# Patient Record
Sex: Female | Born: 1960 | Race: White | Hispanic: No | Smoking: Current every day smoker
Health system: Southern US, Community
[De-identification: ages and names within clinical notes are randomized; demographics above are authoritative.]

## PROBLEM LIST (undated history)

## (undated) DIAGNOSIS — F419 Anxiety disorder, unspecified: Secondary | ICD-10-CM

## (undated) DIAGNOSIS — F319 Bipolar disorder, unspecified: Secondary | ICD-10-CM

## (undated) DIAGNOSIS — M1990 Unspecified osteoarthritis, unspecified site: Secondary | ICD-10-CM

## (undated) DIAGNOSIS — R519 Headache, unspecified: Secondary | ICD-10-CM

## (undated) DIAGNOSIS — D649 Anemia, unspecified: Secondary | ICD-10-CM

## (undated) DIAGNOSIS — R51 Headache: Secondary | ICD-10-CM

## (undated) HISTORY — PX: KNEE SURGERY: SHX244

---

## 2000-11-25 ENCOUNTER — Emergency Department (HOSPITAL_COMMUNITY): Admission: EM | Admit: 2000-11-25 | Discharge: 2000-11-26 | Payer: Self-pay | Admitting: Emergency Medicine

## 2014-06-21 ENCOUNTER — Emergency Department
Admission: EM | Admit: 2014-06-21 | Discharge: 2014-06-21 | Disposition: A | Discharge status: Home / Self Care | Payer: Self-pay | Attending: Emergency Medicine | Admitting: Emergency Medicine

## 2014-06-21 ENCOUNTER — Other Ambulatory Visit: Payer: Self-pay

## 2014-06-21 ENCOUNTER — Encounter: Payer: Self-pay | Admitting: Emergency Medicine

## 2014-06-21 ENCOUNTER — Emergency Department: Payer: Self-pay

## 2014-06-21 DIAGNOSIS — E876 Hypokalemia: Secondary | ICD-10-CM | POA: Insufficient documentation

## 2014-06-21 DIAGNOSIS — R1010 Upper abdominal pain, unspecified: Secondary | ICD-10-CM | POA: Insufficient documentation

## 2014-06-21 DIAGNOSIS — Z79899 Other long term (current) drug therapy: Secondary | ICD-10-CM | POA: Insufficient documentation

## 2014-06-21 DIAGNOSIS — Z880 Allergy status to penicillin: Secondary | ICD-10-CM | POA: Insufficient documentation

## 2014-06-21 DIAGNOSIS — R091 Pleurisy: Secondary | ICD-10-CM | POA: Insufficient documentation

## 2014-06-21 DIAGNOSIS — Z720 Tobacco use: Secondary | ICD-10-CM | POA: Insufficient documentation

## 2014-06-21 DIAGNOSIS — R112 Nausea with vomiting, unspecified: Secondary | ICD-10-CM | POA: Insufficient documentation

## 2014-06-21 DIAGNOSIS — R197 Diarrhea, unspecified: Secondary | ICD-10-CM | POA: Insufficient documentation

## 2014-06-21 LAB — BASIC METABOLIC PANEL
ANION GAP: 17 — AB (ref 5–15)
BUN: 14 mg/dL (ref 6–20)
CO2: 23 mmol/L (ref 22–32)
Calcium: 10 mg/dL (ref 8.9–10.3)
Chloride: 104 mmol/L (ref 101–111)
Creatinine, Ser: 0.84 mg/dL (ref 0.44–1.00)
GFR calc non Af Amer: >60 mL/min (ref >60)
Glucose, Bld: 173 mg/dL — ABNORMAL HIGH (ref 65–99)
Potassium: 2.8 mmol/L — CL (ref 3.5–5.1)
SODIUM: 144 mmol/L (ref 135–145)

## 2014-06-21 LAB — CBC WITH DIFFERENTIAL/PLATELET
Basophils Absolute: 0.1 10*3/uL (ref 0–0.1)
Basophils Relative: 2 %
EOS ABS: 0 10*3/uL (ref 0–0.7)
EOS PCT: 0 %
HCT: 52.1 % — ABNORMAL HIGH (ref 35.0–47.0)
Hemoglobin: 17.4 g/dL — ABNORMAL HIGH (ref 12.0–16.0)
LYMPHS ABS: 0.3 10*3/uL — AB (ref 1.0–3.6)
LYMPHS PCT: 4 %
MCH: 34.8 pg — ABNORMAL HIGH (ref 26.0–34.0)
MCHC: 33.5 g/dL (ref 32.0–36.0)
MCV: 103.9 fL — ABNORMAL HIGH (ref 80.0–100.0)
MONOS PCT: 6 %
Monocytes Absolute: 0.5 10*3/uL (ref 0.2–0.9)
NEUTROS ABS: 8.4 10*3/uL — AB (ref 1.4–6.5)
NEUTROS PCT: 88 %
Platelets: 231 10*3/uL (ref 150–440)
RBC: 5.01 MIL/uL (ref 3.80–5.20)
RDW: 14.5 % (ref 11.5–14.5)
WBC: 9.4 10*3/uL (ref 3.6–11.0)

## 2014-06-21 LAB — LIPASE, BLOOD: Lipase: 53 U/L — ABNORMAL HIGH (ref 22–51)

## 2014-06-21 LAB — TROPONIN I

## 2014-06-21 MED ORDER — IOHEXOL 300 MG/ML  SOLN
100.0000 mL | Freq: Once | INTRAMUSCULAR | Status: AC | PRN
  Administered 2014-06-21: 100 mL via INTRAVENOUS

## 2014-06-21 MED ORDER — HYDROMORPHONE HCL 1 MG/ML IJ SOLN
INTRAMUSCULAR | Status: AC
  Administered 2014-06-21: 0.5 mg via INTRAVENOUS
  Filled 2014-06-21: qty 1

## 2014-06-21 MED ORDER — IOHEXOL 240 MG/ML SOLN
25.0000 mL | Freq: Once | INTRAMUSCULAR | Status: AC | PRN
  Administered 2014-06-21: 25 mL via ORAL

## 2014-06-21 MED ORDER — FENTANYL CITRATE (PF) 100 MCG/2ML IJ SOLN
25.0000 ug | Freq: Once | INTRAMUSCULAR | Status: AC
  Administered 2014-06-21: 25 ug via INTRAVENOUS

## 2014-06-21 MED ORDER — POTASSIUM CHLORIDE CRYS ER 20 MEQ PO TBCR
EXTENDED_RELEASE_TABLET | ORAL | Status: AC
  Administered 2014-06-21: 40 meq via ORAL
  Filled 2014-06-21: qty 2

## 2014-06-21 MED ORDER — PROMETHAZINE HCL 25 MG/ML IJ SOLN
12.5000 mg | Freq: Once | INTRAMUSCULAR | Status: AC
  Administered 2014-06-21: 12.5 mg via INTRAVENOUS

## 2014-06-21 MED ORDER — POTASSIUM CHLORIDE 10 MEQ/100ML IV SOLN
10.0000 meq | INTRAVENOUS | Status: AC
  Filled 2014-06-21 (×3): qty 100

## 2014-06-21 MED ORDER — HYDROMORPHONE HCL 1 MG/ML IJ SOLN
1.0000 mg | Freq: Once | INTRAMUSCULAR | Status: AC
  Administered 2014-06-21: 1 mg via INTRAVENOUS

## 2014-06-21 MED ORDER — POTASSIUM CHLORIDE 10 MEQ/100ML IV SOLN: 10.0000 meq | Freq: Once | INTRAVENOUS | Status: DC

## 2014-06-21 MED ORDER — ONDANSETRON HCL 4 MG/2ML IJ SOLN
4.0000 mg | Freq: Once | INTRAMUSCULAR | Status: AC
  Administered 2014-06-21: 4 mg via INTRAVENOUS

## 2014-06-21 MED ORDER — ONDANSETRON HCL 4 MG/2ML IJ SOLN
INTRAMUSCULAR | Status: AC
  Administered 2014-06-21: 4 mg via INTRAVENOUS
  Filled 2014-06-21: qty 2

## 2014-06-21 MED ORDER — DIPHENHYDRAMINE HCL 50 MG/ML IJ SOLN
25.0000 mg | Freq: Once | INTRAMUSCULAR | Status: AC
  Administered 2014-06-21: 25 mg via INTRAVENOUS

## 2014-06-21 MED ORDER — HYDROMORPHONE HCL 1 MG/ML IJ SOLN
INTRAMUSCULAR | Status: AC
  Administered 2014-06-21: 1 mg via INTRAVENOUS
  Filled 2014-06-21: qty 1

## 2014-06-21 MED ORDER — DICYCLOMINE HCL 10 MG PO CAPS: 10.0000 mg | ORAL_CAPSULE | Freq: Four times a day (QID) | ORAL | Status: DC

## 2014-06-21 MED ORDER — PROMETHAZINE HCL 25 MG/ML IJ SOLN
INTRAMUSCULAR | Status: AC
  Administered 2014-06-21: 12.5 mg via INTRAVENOUS
  Filled 2014-06-21: qty 1

## 2014-06-21 MED ORDER — ONDANSETRON HCL 4 MG PO TABS: 4.0000 mg | ORAL_TABLET | Freq: Every day | ORAL | Status: AC | PRN

## 2014-06-21 MED ORDER — SODIUM CHLORIDE 0.9 % IV BOLUS (SEPSIS)
1000.0000 mL | Freq: Once | INTRAVENOUS | Status: AC
Start: 2014-06-21 — End: 2014-06-21
  Administered 2014-06-21: 1000 mL via INTRAVENOUS

## 2014-06-21 MED ORDER — FENTANYL CITRATE (PF) 100 MCG/2ML IJ SOLN
INTRAMUSCULAR | Status: AC
  Administered 2014-06-21: 25 ug via INTRAVENOUS
  Filled 2014-06-21: qty 2

## 2014-06-21 MED ORDER — ONDANSETRON HCL 4 MG/2ML IJ SOLN
INTRAMUSCULAR | Status: AC
  Administered 2014-06-21: 19:00:00
  Filled 2014-06-21: qty 2

## 2014-06-21 MED ORDER — DIPHENHYDRAMINE HCL 50 MG/ML IJ SOLN
INTRAMUSCULAR | Status: AC
  Filled 2014-06-21: qty 1

## 2014-06-21 MED ORDER — HYDROMORPHONE HCL 1 MG/ML IJ SOLN
0.5000 mg | Freq: Once | INTRAMUSCULAR | Status: AC
Start: 2014-06-21 — End: 2014-06-21
  Administered 2014-06-21: 0.5 mg via INTRAVENOUS

## 2014-06-21 NOTE — ED Notes (Signed)
Pt c/o cough with congestion, chest discomfort since early this morning.Marland Kitchen

## 2014-06-21 NOTE — ED Provider Notes (Signed)
[facility] Emergency Department Provider Note   ____________________________________________  Time seen:   I have reviewed the triage vital signs and the nursing notes.   HISTORY  Chief Complaint Influenza and Pleurisy    HPI Claire Brown is a 54 y.o. female who is complaining of multiple episodes of vomiting and nausea and cough. Moderate abdominal pain. Located mostly upper. Moderate cough. No fever. 2 episodes of diarrhea. No bloody stools. Respirations symptoms started several days ago and the nausea started yesterday.      History reviewed. No pertinent past medical history.  There are no active problems to display for this patient.   Past Surgical History  Procedure Laterality Date  . Cesarean section      Current Outpatient Rx  Name  Route  Sig  Dispense  Refill  . ondansetron (ZOFRAN) 4 MG/5ML solution   Oral   Take by mouth once.           Allergies Augmentin  History reviewed. No pertinent family history.  Social History History  Substance Use Topics  . Smoking status: Smoker, Current Status Unknown  . Smokeless tobacco: Not on file  . Alcohol Use: Yes     Comment: OCC    Review of Systems  Constitutional: Negative for fever. Eyes: Negative for visual changes. ENT: Negative for sore throat. Cardiovascular: Negative for chest pain. Respiratory: Positive cough Gastrointestinal: Positive per history of present illness Genitourinary: Negative for dysuria. Musculoskeletal: Negative for back pain. Skin: Negative for rash. Neurological: Negative for headaches, focal weakness or numbness.   10-point ROS otherwise negative.  ____________________________________________   PHYSICAL EXAM:  VITAL SIGNS: ED Triage Vitals  Enc Vitals Group     BP 06/21/14 0848 136/75 mmHg     Pulse Rate 06/21/14 0848 78     Resp 06/21/14 0848 24     Temp 06/21/14 0848 98.4 F (36.9 C)     Temp Source 06/21/14 0848 Oral   SpO2 06/21/14 0848 100 %     Weight 06/21/14 0848 135 lb (61.236 kg)     Height 06/21/14 0848 5\' 1"  (1.549 m)     Head Cir --      Peak Flow --      Pain Score 06/21/14 0850 6     Pain Loc --      Pain Edu? --      Excl. in [place]? --      Constitutional: Alert and oriented. Patient is unkept. Eyes: Conjunctivae are normal. PERRL. Normal extraocular movements. ENT   Head: Normocephalic and atraumatic.   Nose: No congestion/rhinnorhea.   Mouth/Throat: Mucous membranes are mildly dry.   Neck: No stridor. Cardiovascular: Normal rate, regular rhythm.  No murmurs, rubs, or gallops. Respiratory: Normal respiratory effort without tachypnea nor retractions. Breath sounds are clear and equal bilaterally. No wheezes/rales/rhonchi. Gastrointestinal: Soft and mildly tender in the upper abdomen. No distention. No guarding no rebound. Genitourinary: Deferred Musculoskeletal: Nontender with normal range of motion in all extremities. No joint effusions.  No lower extremity tenderness nor edema. Neurologic:  Normal speech and language. No gross focal neurologic deficits are appreciated. Speech is normal.. Skin:  Skin is warm, dry and intact. No rash noted. Psychiatric: Mood and affect are normal. Speech and behavior are normal. Patient exhibits appropriate insight and judgment.  ____________________________________________    LABS (pertinent positives/negatives)  Normal white blood cell count Potassium 2.8 Troponin negative  CO2 23 Glucose 173 next anion gap 17 ____________________________________________   EKG  78 bpm normal sinus rhythm narrow QRS normal axis. Nonspecific ST and T-wave  ____________________________________________    RADIOLOGY  Reviewed one view portable chest negative  ____________________________________________   PROCEDURES  Procedure(s) performed: None  Critical Care performed: No  ____________________________________________   INITIAL  IMPRESSION / ASSESSMENT AND PLAN / ED COURSE  Pertinent labs & imaging results that were availablec onsidered in my medical decision making (see chart for details).  Patient has been having continued abdominal pain vomiting and diarrhea since she's been in the emergency department and after her first dose of IV symptomatically medications and normal saline. I will repeat the symptomatically doses. I am going to check a CT of her abdomen due to persistent and significant abdominal pain. Patient care transferred to Dr. Dineen Kid.  ____________________________________________   FINAL CLINICAL IMPRESSION(S) / ED DIAGNOSES  Vomiting, nonspecific acute  Hypokalemia  Abdominal, nonspecific acute    Lisa Roca, MD 06/21/14 [phone]

## 2014-06-21 NOTE — ED Notes (Signed)
Vital signs stable.Pt reports a decrease in her nausea

## 2014-06-21 NOTE — ED Provider Notes (Signed)
  Physical Exam  BP 139/84 mmHg  Pulse 82  Temp(Src) 98.4 F (36.9 C) (Oral)  Resp 24  Ht 5\' 1"  (1.549 m)  Wt 135 lb (61.236 kg)  BMI 25.52 kg/m2  SpO2 98% Patient presents with multiple complaints. Per Dr. Reita Cliche, the general impression is a viral type syndrome. However, the patient will be receiving a CAT scan of the abdomen and pelvis to rule out any intrapelvic pathology. Physical Exam ----------------------------------------- 8:40 PM on 06/21/2014 -----------------------------------------  Patient resting comfortably. Had vomited once previously during this visit but was given anti-medics and was tolerating by mouth contrast. Has not vomited since.  ED Course  Procedures Patient has a reassuring abdominal CT as well as labs. We'll discharge with Bentyl and Zofran for symptomatic treatment. Suspect likely viral illness. Possible flu, but is out of the range for Tamiflu. Says the symptoms started this past Tuesday which was 4 days ago.       Doran Stabler, MD 06/21/14 [phone]

## 2014-06-21 NOTE — ED Notes (Signed)
Pt states sx where sudden onset with SOB, cough, bodyaches, nausea.Marland Kitchen

## 2014-06-21 NOTE — ED Notes (Signed)
Pt reports that she started vomiting on Tuesday got worse last night. CP and Abd pain started at 0200. Denies diarrhea.

## 2015-06-05 ENCOUNTER — Emergency Department: Payer: Self-pay

## 2015-06-05 ENCOUNTER — Emergency Department
Admission: EM | Admit: 2015-06-05 | Discharge: 2015-06-05 | Disposition: A | Discharge status: Home / Self Care | Payer: Self-pay | Attending: Emergency Medicine | Admitting: Emergency Medicine

## 2015-06-05 DIAGNOSIS — Y929 Unspecified place or not applicable: Secondary | ICD-10-CM | POA: Insufficient documentation

## 2015-06-05 DIAGNOSIS — Y939 Activity, unspecified: Secondary | ICD-10-CM | POA: Insufficient documentation

## 2015-06-05 DIAGNOSIS — S72412A Displaced unspecified condyle fracture of lower end of left femur, initial encounter for closed fracture: Secondary | ICD-10-CM | POA: Insufficient documentation

## 2015-06-05 DIAGNOSIS — IMO0002 Reserved for concepts with insufficient information to code with codable children: Secondary | ICD-10-CM

## 2015-06-05 DIAGNOSIS — W1839XA Other fall on same level, initial encounter: Secondary | ICD-10-CM | POA: Insufficient documentation

## 2015-06-05 DIAGNOSIS — Y999 Unspecified external cause status: Secondary | ICD-10-CM | POA: Insufficient documentation

## 2015-06-05 MED ORDER — KETOROLAC TROMETHAMINE 60 MG/2ML IM SOLN
60.0000 mg | Freq: Once | INTRAMUSCULAR | Status: AC
  Administered 2015-06-05: 60 mg via INTRAMUSCULAR
  Filled 2015-06-05: qty 2

## 2015-06-05 MED ORDER — OXYCODONE-ACETAMINOPHEN 5-325 MG PO TABS: 1.0000 | ORAL_TABLET | ORAL | Status: DC | PRN

## 2015-06-05 MED ORDER — HYDROCODONE-ACETAMINOPHEN 5-325 MG PO TABS
2.0000 | ORAL_TABLET | Freq: Once | ORAL | Status: AC
  Administered 2015-06-05: 2 via ORAL
  Filled 2015-06-05: qty 2

## 2015-06-05 NOTE — ED Notes (Signed)
One of her dogs ran into her and made her fall 2 days ago now has left knee pain and swelling at this time.

## 2015-06-05 NOTE — ED Provider Notes (Signed)
[facility] Emergency Department Provider Note  ____________________________________________  Time seen: Approximately 8:07 PM  I have reviewed the triage vital signs and the nursing notes.   HISTORY  Chief Complaint Knee Pain    HPI Claire Brown is a 55 y.o. female presents for evaluation of left knee pain and swelling. Patient states that one of her dogs ran into a major fall 2 days ago. Describes her pain as an 8/10 nonradiating. Increased pain with weightbearing and ambulation.   No past medical history on file.  There are no active problems to display for this patient.   Past Surgical History  Procedure Laterality Date  . Cesarean section      Current Outpatient Rx  Name  Route  Sig  Dispense  Refill  . EXPIRED: dicyclomine (BENTYL) 10 MG capsule   Oral   Take 1 capsule (10 mg total) by mouth 4 (four) times daily.   20 capsule   0   . diphenhydrAMINE (BENADRYL) 25 MG tablet   Oral   Take 25 mg by mouth every 6 (six) hours as needed for allergies.         Marland Kitchen meclizine (ANTIVERT) 25 MG tablet   Oral   Take 25 mg by mouth 3 (three) times daily as needed for dizziness or nausea.         . ondansetron (ZOFRAN) 4 MG tablet   Oral   Take 1 tablet (4 mg total) by mouth daily as needed for nausea or vomiting.   10 tablet   1   . oxyCODONE-acetaminophen (ROXICET) 5-325 MG tablet   Oral   Take 1-2 tablets by mouth every 4 (four) hours as needed for severe pain.   30 tablet   0   . triamcinolone (NASACORT) 55 MCG/ACT AERO nasal inhaler   Nasal   Place 2 sprays into the nose daily.           Allergies Augmentin and Valium  No family history on file.  Social History Social History  Substance Use Topics  . Smoking status: Smoker, Current Status Unknown  . Smokeless tobacco: Not on file  . Alcohol Use: Yes     Comment: OCC    Review of Systems Constitutional: No fever/chills Cardiovascular: Denies chest  pain. Respiratory: Denies shortness of breath. Musculoskeletal: Positive for left knee pain and swelling. Skin: Negative for rash. Neurological: Negative for headaches, focal weakness or numbness.  10-point ROS otherwise negative.  ____________________________________________   PHYSICAL EXAM:  VITAL SIGNS: ED Triage Vitals  Enc Vitals Group     BP 06/05/15 1952 103/74 mmHg     Pulse Rate 06/05/15 1952 97     Resp 06/05/15 1952 18     Temp 06/05/15 1952 98.2 F (36.8 C)     Temp Source 06/05/15 1952 Oral     SpO2 06/05/15 1952 96 %     Weight 06/05/15 1952 130 lb (58.968 kg)     Height 06/05/15 1952 5' (1.524 m)     Head Cir --      Peak Flow --      Pain Score 06/05/15 1959 8     Pain Loc --      Pain Edu? --      Excl. in [place]? --     Constitutional: Alert and oriented. Well appearing and in no acute distress. Cardiovascular: Normal rate, regular rhythm. Grossly normal heart sounds.  Good peripheral circulation. Respiratory: Normal respiratory effort.  No retractions. Lungs  CTAB. Musculoskeletal: Left knee with obvious edema with tenderness noted. Limited range of motion. Increased pain with flexion. Distally neurovascularly intact. Positive ecchymosis and bruising noted. Mainly on the medial aspect. Neurologic:  Normal speech and language. No gross focal neurologic deficits are appreciated. No gait instability. Skin:  Skin is warm, dry and intact. No rash noted. Psychiatric: Mood and affect are normal. Speech and behavior are normal.  ____________________________________________   LABS (all labs ordered are listed, but only abnormal results are displayed)  Labs Reviewed - No data to display ____________________________________________  RADIOLOGY  FINDINGS: Probable impaction fracture to the femoral condyle with small osseous fragment in Hoffa's fat pad. Small osseous density adjacent to the lateral joint line. There is a moderate joint effusion. Tibial femoral  alignment is maintained. Patella appears normally located. Anterior soft tissue edema.  IMPRESSION: Probable impaction fracture of the femoral condyle with small fracture fragments in Hoffa's fat pad and joint effusion. Findings can be seen with patellar dislocation relocation injury. Patella appears normally located at this time. ____________________________________________   PROCEDURES  Procedure(s) performed: None  Critical Care performed: No  ____________________________________________   INITIAL IMPRESSION / ASSESSMENT AND PLAN / ED COURSE  Pertinent labs & imaging results that were available during my care of the patient were reviewed by me and considered in my medical decision making (see chart for details). Discussed all clinical findings with Dr. Mack Guise. Patient to have a CT of her knee prior to discharge and to follow-up in the office tomorrow. Patient discharged home with a prescription for Percocet 5/325 #30 no refills ____________________________________________   FINAL CLINICAL IMPRESSION(S) / ED DIAGNOSES  Final diagnoses:  Femoral condyle fracture, left, closed, initial encounter     This chart was dictated using voice recognition software/Dragon. Despite best efforts to proofread, errors can occur which can change the meaning. Any change was purely unintentional.   Arlyss Repress, PA-C 06/05/15 2052  Wandra Arthurs, MD 06/05/15 [phone]

## 2015-06-05 NOTE — ED Notes (Signed)
Pt reports to ED w/ complaints of left knee pain; pt reports that "one of our huskie's clipped me" on the left side 2 days ago. Pt left knee bruised, swollen, pt reports difficulty walking due to pain.  Pt with full range of motion to left knee.

## 2015-06-05 NOTE — Discharge Instructions (Signed)
Knee Fracture, Adult °A knee fracture is a break in a bone of the knee. The break may be in the kneecap (patella), the lower part of the thigh bone (femur), or the upper part of the shin bone (tibia). °There are several types of fractures. They include: °· Stable. In this type of fracture, the bones of the knee remain in place after the break. °· Displaced. In this type of fracture, the bones no longer line up after the break. °· Comminuted. In this type of fracture, the bone breaks into several pieces. °· Open. In this type of fracture, the broken bone comes through the skin. °CAUSES °This injury is usually caused by a fall. This injury can happen because of the impact of the fall or from a violent contraction of the leg muscles before you hit the ground. It can also result from a car accident or a collision with a hard surface. °RISK FACTORS °This injury is more likely to develop in people who: °· Are female. °· Are 20-50 years old. °· Participate in high-energy sports. °· Have a condition that weakens the bones, such as osteoporosis. °· Have had a knee replacement. °SYMPTOMS °Symptoms of this injury include: °· Pain. °· Swelling. °· Bruising. °· Inability to bend your knee. °· Misshapen knee. °· Inability to walk. °· Inability to use your injured leg to support your body weight. °DIAGNOSIS °This injury is diagnosed with a physical exam. Your health care provider may also order: °· Imaging studies, such as an X-ray, CT scan, MRI scan, or ultrasound. °· A procedure called arthroscopy to view the inside of your knee with a small camera. °TREATMENT °Treatment for this injury may involve: °· Wearing a splint until swelling goes down. °· Wearing a cast to keep the fractured bone from moving while it heals. A cast is usually put on after swelling has gone down. °· Surgery to move a bone back into place. °HOME CARE INSTRUCTIONS °If You Have a Splint: °· Wear it as directed by your health care provider. Remove it only as  directed by your health care provider. °· Loosen the splint if your toes become numb and tingle, or if they turn cold and blue. °· Do not put pressure on any part of your splint. °If You Have a Cast: °· Do not stick anything inside the cast to scratch your skin. Doing that increases your risk of infection. °· Check the skin around the cast every day. Report any concerns to your health care provider. You may put lotion on dry skin around the edges of the cast. Do not apply lotion to the skin underneath the cast. °Bathing °· Cover the cast or splint with a watertight plastic bag to protect it from water while you take a bath or a shower. Do not let the cast or splint get wet. °Managing Pain, Stiffness, and Swelling °· If directed, apply ice to the injured area: °¨ Put ice in a plastic bag. °¨ Place a towel between your skin and the bag. °¨ Leave the ice on for 20 minutes, 2-3 times a day. °· Move your toes often to avoid stiffness and to lessen swelling. °· Raise the injured area above the level of your heart while you are lying down. °Driving °· Do not drive or operate heavy machinery while taking pain medicine. °· Do not drive while wearing a cast or splint on a hand or foot that you use for driving. °Activity °· Return to your normal activities as directed   by your health care provider. Ask your health care provider what activities are safe for you. °General Instructions °· Do not put pressure on any part of the cast or splint until it is fully hardened. This may take several hours. °· Keep the cast or splint clean and dry. °· Do not use any tobacco products, including cigarettes, chewing tobacco, or electronic cigarettes. Tobacco can delay bone healing. If you need help quitting, ask your health care provider. °· Take medicines only as directed by your health care provider. °· Keep all follow-up visits as directed by your health care provider. This is important. °SEEK MEDICAL CARE IF: °· You have knee pain and  swelling. °· You have trouble walking. °· Your cast becomes wet or damaged or suddenly feels too tight. °SEEK IMMEDIATE MEDICAL CARE IF: °· Your pain and swelling get worse. °· You have severe pain below the fracture. °· Your skin or toenails turn blue or gray, feel cold, or become numb. °· You have fluid, blood, or pus coming from under your cast. °  °This information is not intended to replace advice given to you by your health care provider. Make sure you discuss any questions you have with your health care provider. °  °Document Released: 12/15/2005 Document Revised: 02/22/2014 Document Reviewed: 10/03/2013 °Elsevier Interactive Patient Education ©2016 [employer] ° °

## 2015-07-01 ENCOUNTER — Emergency Department: Payer: Self-pay

## 2015-07-01 ENCOUNTER — Emergency Department
Admission: EM | Admit: 2015-07-01 | Discharge: 2015-07-01 | Disposition: A | Discharge status: Home / Self Care | Payer: Self-pay | Attending: Emergency Medicine | Admitting: Emergency Medicine

## 2015-07-01 ENCOUNTER — Encounter: Payer: Self-pay | Admitting: Emergency Medicine

## 2015-07-01 DIAGNOSIS — Y999 Unspecified external cause status: Secondary | ICD-10-CM | POA: Insufficient documentation

## 2015-07-01 DIAGNOSIS — S72412S Displaced unspecified condyle fracture of lower end of left femur, sequela: Secondary | ICD-10-CM

## 2015-07-01 DIAGNOSIS — Z79899 Other long term (current) drug therapy: Secondary | ICD-10-CM | POA: Insufficient documentation

## 2015-07-01 DIAGNOSIS — M25462 Effusion, left knee: Secondary | ICD-10-CM | POA: Insufficient documentation

## 2015-07-01 DIAGNOSIS — Y929 Unspecified place or not applicable: Secondary | ICD-10-CM | POA: Insufficient documentation

## 2015-07-01 DIAGNOSIS — X58XXXA Exposure to other specified factors, initial encounter: Secondary | ICD-10-CM | POA: Insufficient documentation

## 2015-07-01 DIAGNOSIS — S72422A Displaced fracture of lateral condyle of left femur, initial encounter for closed fracture: Secondary | ICD-10-CM | POA: Insufficient documentation

## 2015-07-01 DIAGNOSIS — Y939 Activity, unspecified: Secondary | ICD-10-CM | POA: Insufficient documentation

## 2015-07-01 MED ORDER — HYDROCODONE-ACETAMINOPHEN 5-325 MG PO TABS: 1.0000 | ORAL_TABLET | Freq: Three times a day (TID) | ORAL | Status: DC | PRN

## 2015-07-01 NOTE — ED Notes (Signed)
Pt to triage via w/c with no distress noted; st fracture of left knee on 4/20; last 3 days had return of pain with no known injury

## 2015-07-01 NOTE — Discharge Instructions (Signed)
Knee Effusion Knee effusion means that you have extra fluid in your knee. This can cause pain. Your knee may be more difficult to bend and move. HOME CARE  Use crutches as told by your doctor.  Wear a knee brace as told by your doctor.  Apply ice to the swollen area:  Put ice in a plastic bag.  Place a towel between your skin and the bag.  Leave the ice on for 20 minutes, 2-3 times per day.  Keep your knee raised (elevated) when you are sitting or lying down.  Take medicines only as told by your doctor.  Do any rehabilitation or strengthening exercises as told by your doctor.  Rest your knee as told by your doctor. You may start doing your normal activities again when your doctor says it is okay.  Keep all follow-up visits as told by your doctor. This is important. GET HELP IF:   You continue to have pain in your knee. GET HELP RIGHT AWAY IF:  You have increased swelling or redness of your knee.  You have severe pain in your knee.  You have a fever.   This information is not intended to replace advice given to you by your health care provider. Make sure you discuss any questions you have with your health care provider.   Document Released: 03/06/2010 Document Revised: 02/22/2014 Document Reviewed: 09/17/2013 Elsevier Interactive Patient Education 2016 [place] the prescription meds as directed. Continue to dose the previously prescribed Mobic as directed. You should follow-up with Dr. Mack Guise, as scheduled for further pain management. Rest with the leg elevated and apply ice to reduce swelling. You should see your primary care provider for pain relief between now and the time you see Dr. Mack Guise.

## 2015-07-01 NOTE — ED Provider Notes (Signed)
[facility] Emergency Department Provider Note ____________________________________________  Time seen: 2059  I have reviewed the triage vital signs and the nursing notes.  HISTORY  Chief Complaint  Knee Pain  HPI Claire Brown is a 55 y.o. female visits to the ED for evaluation of onset of swelling to the left knee over the last 3 days. The patient was evaluated here for a close condylar compression fracture of the left femur. She was evaluated here by Dr. Mack Guise and discharged on 4/20 with pain medicines and plans for follow-up. She admits to being evaluated by him about 3 days after the initial injury and being placed on Mobic. He did not refill her pain medicines at that time if she had received the prescription upon discharge from the ED. She returns today denied any recent injury, accident, trauma. She does note significant swelling to the left knee as well as some increased pain. She is scheduled to see Dr. Mack Guise again in about 4 weeks for reevaluation. She has not seen a primary care provider in the interim.She also has graduated out of the previous knee immobilizer. She does utilize crutches intermittently for ambulation. She reports her pain an 8/10 in triage.  History reviewed. No pertinent past medical history.  There are no active problems to display for this patient.   Past Surgical History  Procedure Laterality Date  . Cesarean section    . Knee surgery      Current Outpatient Rx  Name  Route  Sig  Dispense  Refill  . EXPIRED: dicyclomine (BENTYL) 10 MG capsule   Oral   Take 1 capsule (10 mg total) by mouth 4 (four) times daily.   20 capsule   0   . diphenhydrAMINE (BENADRYL) 25 MG tablet   Oral   Take 25 mg by mouth every 6 (six) hours as needed for allergies.         Marland Kitchen HYDROcodone-acetaminophen (NORCO) 5-325 MG tablet   Oral   Take 1 tablet by mouth 3 (three) times daily as needed for moderate pain.   21 tablet   0   .  meclizine (ANTIVERT) 25 MG tablet   Oral   Take 25 mg by mouth 3 (three) times daily as needed for dizziness or nausea.         Marland Kitchen oxyCODONE-acetaminophen (ROXICET) 5-325 MG tablet   Oral   Take 1-2 tablets by mouth every 4 (four) hours as needed for severe pain.   30 tablet   0   . triamcinolone (NASACORT) 55 MCG/ACT AERO nasal inhaler   Nasal   Place 2 sprays into the nose daily.          Allergies Augmentin; Penicillins; and Valium  No family history on file.  Social History Social History  Substance Use Topics  . Smoking status: Smoker, Current Status Unknown  . Smokeless tobacco: None  . Alcohol Use: Yes     Comment: OCC   Review of Systems  Constitutional: Negative for fever. Musculoskeletal: Negative for back pain. Left knee pain and swelling.  Skin: Negative for rash. Neurological: Negative for headaches, focal weakness or numbness. ____________________________________________  PHYSICAL EXAM:  VITAL SIGNS: ED Triage Vitals  Enc Vitals Group     BP 07/01/15 2007 98/64 mmHg     Pulse Rate 07/01/15 2007 84     Resp 07/01/15 2007 18     Temp 07/01/15 2007 97.8 F (36.6 C)     Temp Source 07/01/15 2007 Oral  SpO2 07/01/15 2007 97 %     Weight 07/01/15 2007 130 lb (58.968 kg)     Height 07/01/15 2007 5' (1.524 m)     Head Cir --      Peak Flow --      Pain Score 07/01/15 2046 8     Pain Loc --      Pain Edu? --      Excl. in [place]? --    Constitutional: Alert and oriented. Well appearing and in no distress. Head: Normocephalic and atraumatic.      Cardiovascular: Normal distal pulses and cap refill. Respiratory: Normal respiratory effort.  Musculoskeletal: Left knee with a moderate effusion noted. No erythema, edema, or dislocation appreciated. Normal knee ROM. No popliteal space fullness or calf or achilles tenderness appreciated. Nontender with normal range of motion in all other extremities.  Neurologic:  Mildly antalgic gait without ataxia.  Normal speech and language. No gross focal neurologic deficits are appreciated. Skin:  Skin is warm, dry and intact. No rash noted. ____________________________________________   RADIOLOGY  Left Knee  IMPRESSION: Again identified fracture involving the articular surface of the lateral femoral condyle with a small displaced bone fragment at the lateral joint line.  No new bony abnormalities.  Decreased LEFT knee joint effusion versus prior study.  I, Shahidah Nesbitt, Dannielle Karvonen, personally viewed and evaluated these images (plain radiographs) as part of my medical decision making, as well as reviewing the written report by the radiologist. ____________________________________________  INITIAL IMPRESSION / [place] / ED COURSE  Patient with knee pain and effusion following a close stable femoral condyle fracture. She presents to the ED primarily for request for pain management. She is advised to continue to dose her Mobitz as previously prescribed. She'll be discharged with a prescription for Vicodin # 20 to dose sparingly until her follow-up appointment in June with Dr. Mack Guise. She should rest with the leg elevated and apply ice to reduce the effusion. ____________________________________________  FINAL CLINICAL IMPRESSION(S) / ED DIAGNOSES  Final diagnoses:  Femoral condyle fracture, left, sequela  Knee effusion, left     Melvenia Needles, PA-C 07/02/15 0001  Earleen Newport, MD 07/04/15 1309

## 2015-09-03 ENCOUNTER — Emergency Department: Payer: Self-pay

## 2015-09-03 ENCOUNTER — Emergency Department
Admission: EM | Admit: 2015-09-03 | Discharge: 2015-09-04 | Disposition: A | Discharge status: Home / Self Care | Payer: Self-pay | Attending: Student | Admitting: Student

## 2015-09-03 ENCOUNTER — Encounter: Payer: Self-pay | Admitting: Emergency Medicine

## 2015-09-03 DIAGNOSIS — Z79899 Other long term (current) drug therapy: Secondary | ICD-10-CM | POA: Insufficient documentation

## 2015-09-03 DIAGNOSIS — F17200 Nicotine dependence, unspecified, uncomplicated: Secondary | ICD-10-CM | POA: Insufficient documentation

## 2015-09-03 DIAGNOSIS — R1907 Generalized intra-abdominal and pelvic swelling, mass and lump: Secondary | ICD-10-CM | POA: Insufficient documentation

## 2015-09-03 DIAGNOSIS — R1110 Vomiting, unspecified: Secondary | ICD-10-CM

## 2015-09-03 DIAGNOSIS — D259 Leiomyoma of uterus, unspecified: Secondary | ICD-10-CM

## 2015-09-03 DIAGNOSIS — R1900 Intra-abdominal and pelvic swelling, mass and lump, unspecified site: Secondary | ICD-10-CM

## 2015-09-03 DIAGNOSIS — R1030 Lower abdominal pain, unspecified: Secondary | ICD-10-CM

## 2015-09-03 DIAGNOSIS — R197 Diarrhea, unspecified: Secondary | ICD-10-CM | POA: Insufficient documentation

## 2015-09-03 DIAGNOSIS — R1031 Right lower quadrant pain: Secondary | ICD-10-CM

## 2015-09-03 LAB — COMPREHENSIVE METABOLIC PANEL
ALBUMIN: 2.9 g/dL — AB (ref 3.5–5.0)
ALT: 33 U/L (ref 14–54)
AST: 43 U/L — AB (ref 15–41)
Alkaline Phosphatase: 109 U/L (ref 38–126)
Anion gap: 10 (ref 5–15)
CHLORIDE: 109 mmol/L (ref 101–111)
CO2: 22 mmol/L (ref 22–32)
Calcium: 8.5 mg/dL — ABNORMAL LOW (ref 8.9–10.3)
Creatinine, Ser: 0.51 mg/dL (ref 0.44–1.00)
GFR calc Af Amer: >60 mL/min (ref >60)
GFR calc non Af Amer: >60 mL/min (ref >60)
GLUCOSE: 94 mg/dL (ref 65–99)
POTASSIUM: 3.2 mmol/L — AB (ref 3.5–5.1)
SODIUM: 141 mmol/L (ref 135–145)
Total Bilirubin: 1.4 mg/dL — ABNORMAL HIGH (ref 0.3–1.2)
Total Protein: 5.7 g/dL — ABNORMAL LOW (ref 6.5–8.1)

## 2015-09-03 LAB — URINALYSIS COMPLETE WITH MICROSCOPIC (ARMC ONLY)
Bilirubin Urine: NEGATIVE
Glucose, UA: NEGATIVE mg/dL
NITRITE: NEGATIVE
PH: 7 (ref 5.0–8.0)
Protein, ur: NEGATIVE mg/dL
Specific Gravity, Urine: 1.004 — ABNORMAL LOW (ref 1.005–1.030)

## 2015-09-03 LAB — CBC
HEMATOCRIT: 44.5 % (ref 35.0–47.0)
Hemoglobin: 15.5 g/dL (ref 12.0–16.0)
MCH: 37 pg — ABNORMAL HIGH (ref 26.0–34.0)
MCHC: 34.9 g/dL (ref 32.0–36.0)
MCV: 105.9 fL — AB (ref 80.0–100.0)
Platelets: 235 10*3/uL (ref 150–440)
RBC: 4.2 MIL/uL (ref 3.80–5.20)
RDW: 14.8 % — ABNORMAL HIGH (ref 11.5–14.5)
WBC: 12.6 10*3/uL — AB (ref 3.6–11.0)

## 2015-09-03 LAB — LIPASE, BLOOD: LIPASE: 50 U/L (ref 11–51)

## 2015-09-03 MED ORDER — MORPHINE SULFATE (PF) 4 MG/ML IV SOLN
4.0000 mg | Freq: Once | INTRAVENOUS | Status: AC
Start: 2015-09-03 — End: 2015-09-03
  Administered 2015-09-03: 4 mg via INTRAVENOUS
  Filled 2015-09-03: qty 1

## 2015-09-03 MED ORDER — DIATRIZOATE MEGLUMINE & SODIUM 66-10 % PO SOLN
15.0000 mL | Freq: Once | ORAL | Status: AC
  Administered 2015-09-03: 15 mL via ORAL

## 2015-09-03 MED ORDER — SODIUM CHLORIDE 0.9 % IV BOLUS (SEPSIS)
1000.0000 mL | Freq: Once | INTRAVENOUS | Status: AC
  Administered 2015-09-03: 1000 mL via INTRAVENOUS

## 2015-09-03 MED ORDER — ONDANSETRON HCL 4 MG/2ML IJ SOLN
4.0000 mg | Freq: Once | INTRAMUSCULAR | Status: AC
  Administered 2015-09-03: 4 mg via INTRAVENOUS
  Filled 2015-09-03: qty 2

## 2015-09-03 MED ORDER — IOPAMIDOL (ISOVUE-300) INJECTION 61%
100.0000 mL | Freq: Once | INTRAVENOUS | Status: AC | PRN
  Administered 2015-09-03: 100 mL via INTRAVENOUS

## 2015-09-03 NOTE — ED Provider Notes (Signed)
[facility] Emergency Department Provider Note   ____________________________________________  Time seen: Approximately 10:08 PM  I have reviewed the triage vital signs and the nursing notes.   HISTORY  Chief Complaint Abdominal Pain    HPI MAKYLIE UHLES is a 55 y.o. female with no chronic medical problems who presents for evaluation of diffuse lower abdominal pain today, severe, gradual onset, radiating to the back and associated with multiple episodes of nonbloody nonbilious emesis and multiple episodes of diarrhea. No fevers but she has had chills. No chest pain difficulty breathing. No pain or burning with urination. No coughing, no runny nose or upper respiratory tract infection complaints.   History reviewed. No pertinent past medical history.  There are no active problems to display for this patient.   Past Surgical History  Procedure Laterality Date  . Cesarean section    . Knee surgery      Current Outpatient Rx  Name  Route  Sig  Dispense  Refill  . EXPIRED: dicyclomine (BENTYL) 10 MG capsule   Oral   Take 1 capsule (10 mg total) by mouth 4 (four) times daily.   20 capsule   0   . diphenhydrAMINE (BENADRYL) 25 MG tablet   Oral   Take 25 mg by mouth every 6 (six) hours as needed for allergies.         Marland Kitchen HYDROcodone-acetaminophen (NORCO) 5-325 MG tablet   Oral   Take 1 tablet by mouth 3 (three) times daily as needed for moderate pain.   21 tablet   0   . meclizine (ANTIVERT) 25 MG tablet   Oral   Take 25 mg by mouth 3 (three) times daily as needed for dizziness or nausea.         Marland Kitchen oxyCODONE-acetaminophen (ROXICET) 5-325 MG tablet   Oral   Take 1-2 tablets by mouth every 4 (four) hours as needed for severe pain.   30 tablet   0   . triamcinolone (NASACORT) 55 MCG/ACT AERO nasal inhaler   Nasal   Place 2 sprays into the nose daily.           Allergies Augmentin; Penicillins; and Valium  No family history on  file.  Social History Social History  Substance Use Topics  . Smoking status: Current Every Day Smoker -- 1.00 packs/day  . Smokeless tobacco: None  . Alcohol Use: Yes     Comment: OCC    Review of Systems Constitutional: No fever, +chills Eyes: No visual changes. ENT: No sore throat. Cardiovascular: Denies chest pain. Respiratory: Denies shortness of breath. Gastrointestinal: + abdominal pain.  + nausea, + vomiting.  + diarrhea.  No constipation. Genitourinary: Negative for dysuria. Musculoskeletal: Negative for back pain. Skin: Negative for rash. Neurological: Negative for headaches, focal weakness or numbness.  10-point ROS otherwise negative.  ____________________________________________   PHYSICAL EXAM:  VITAL SIGNS: ED Triage Vitals  Enc Vitals Group     BP 09/03/15 2128 103/72 mmHg     Pulse Rate 09/03/15 2128 91     Resp 09/03/15 2128 18     Temp 09/03/15 2128 98.7 F (37.1 C)     Temp Source 09/03/15 2128 Oral     SpO2 09/03/15 2128 98 %     Weight 09/03/15 2128 127 lb (57.607 kg)     Height 09/03/15 2128 5' (1.524 m)     Head Cir --      Peak Flow --      Pain Score  09/03/15 2129 7     Pain Loc --      Pain Edu? --      Excl. in [place]? --     Constitutional: Alert and oriented. Appears nauseated and fatigued but nontoxic-appearing. Eyes: Conjunctivae are normal. PERRL. EOMI. Head: Atraumatic. Nose: No congestion/rhinnorhea. Mouth/Throat: Mucous membranes are moist.  Oropharynx non-erythematous. Neck: No stridor.  Supple without meningismus. Cardiovascular: Normal rate, regular rhythm. Grossly normal heart sounds.  Good peripheral circulation. Respiratory: Normal respiratory effort.  No retractions. Lungs CTAB. Gastrointestinal: Soft with mild diffuse tenderness, moderate tenderness in the right lower quadrant and the suprapubic region without rebound or guarding. No CVA tenderness. Genitourinary: deferred Musculoskeletal: No lower extremity  tenderness nor edema.  No joint effusions. Neurologic:  Normal speech and language. No gross focal neurologic deficits are appreciated. No gait instability. Skin:  Skin is warm, dry and intact. No rash noted. Psychiatric: Mood and affect are normal. Speech and behavior are normal.  ____________________________________________   LABS (all labs ordered are listed, but only abnormal results are displayed)  Labs Reviewed  COMPREHENSIVE METABOLIC PANEL - Abnormal; Notable for the following:    Potassium 3.2 (*)    BUN <5 (*)    Calcium 8.5 (*)    Total Protein 5.7 (*)    Albumin 2.9 (*)    AST 43 (*)    Total Bilirubin 1.4 (*)    All other components within normal limits  CBC - Abnormal; Notable for the following:    WBC 12.6 (*)    MCV 105.9 (*)    MCH 37.0 (*)    RDW 14.8 (*)    All other components within normal limits  URINALYSIS COMPLETEWITH MICROSCOPIC ([facility] ONLY) - Abnormal; Notable for the following:    Color, Urine YELLOW (*)    APPearance CLEAR (*)    Ketones, ur TRACE (*)    Specific Gravity, Urine 1.004 (*)    Hgb urine dipstick 1+ (*)    Leukocytes, UA 1+ (*)    Bacteria, UA RARE (*)    Squamous Epithelial / LPF 0-5 (*)    All other components within normal limits  URINE CULTURE  LIPASE, BLOOD   ____________________________________________  EKG  none ____________________________________________  RADIOLOGY  CT abdomen and pelvis- pending ____________________________________________   PROCEDURES  Procedure(s) performed: None  Procedures  Critical Care performed: No  ____________________________________________   INITIAL IMPRESSION / ASSESSMENT AND PLAN / ED COURSE  Pertinent labs & imaging results that were available during my care of the patient were reviewed by me and considered in my medical decision making (see chart for details).  SHAWNEY KWIAT is a 55 y.o. female with no chronic medical problems who presents for evaluation of diffuse  lower abdominal pain today, severe, gradual onset, radiating to the back and associated with multiple episodes of nonbloody nonbilious emesis and multiple episodes of diarrhea. On exam, she appears nauseated and fatigued but nontoxic appearing. Her vital signs are stable, she is afebrile. She does have tenderness to palpation worse in the right lower quadrant suprapubic region. CBC shows mild leukocytosis, CMP with mild T bili elevation likely secondary to vomiting. Urinalysis with 1+ leukocytes but not consistent with infection. Normal lipase. We'll treat her symptomatically with pain medicines, antiemetics, IV fluids. Given her pain, leukocytosis, tenderness in the right lower quadrant, we'll obtain CT of the abdomen and pelvis to further evaluate the cause of her pain and to rule out appendicitis. Care transferred to Dr. Owens Shark at 11:20 PM.  ____________________________________________   FINAL CLINICAL IMPRESSION(S) / ED DIAGNOSES  Final diagnoses:  Lower abdominal pain  Vomiting and diarrhea      NEW MEDICATIONS STARTED DURING THIS VISIT:  New Prescriptions   No medications on file     Note:  This document was prepared using Dragon voice recognition software and may include unintentional dictation errors.    Joanne Gavel, MD 09/03/15 [phone]

## 2015-09-03 NOTE — ED Notes (Signed)
Pt reports pain to lower abd and back improving but still coming in waves. Nauseated still but better than it was. Will continue to monitor. No distress noted.

## 2015-09-03 NOTE — ED Notes (Signed)
Patient transported to CT. Pt reported vomited 1/4 of the contrast.

## 2015-09-03 NOTE — ED Notes (Signed)
Patient ambulatory to triage with steady gait, pt c/o lower abdominal pain and back pain along with diarrhea, nausea and dysuria. Denies blood in urine. Pt alert and oriented x 4, no increased work in breathing noted, skin warm and dry.

## 2015-09-04 ENCOUNTER — Emergency Department: Payer: Self-pay

## 2015-09-04 MED ORDER — OXYCODONE-ACETAMINOPHEN 5-325 MG PO TABS: 1.0000 | ORAL_TABLET | ORAL | Status: DC | PRN

## 2015-09-04 MED ORDER — LORAZEPAM 2 MG/ML IJ SOLN
INTRAMUSCULAR | Status: AC
Start: 2015-09-04 — End: 2015-09-04
  Administered 2015-09-04: 1 mg via INTRAVENOUS
  Filled 2015-09-04: qty 1

## 2015-09-04 MED ORDER — GADOBENATE DIMEGLUMINE 529 MG/ML IV SOLN
15.0000 mL | Freq: Once | INTRAVENOUS | Status: AC | PRN
  Administered 2015-09-04: 11 mL via INTRAVENOUS

## 2015-09-04 MED ORDER — LORAZEPAM 2 MG/ML IJ SOLN
1.0000 mg | Freq: Once | INTRAMUSCULAR | Status: AC
  Administered 2015-09-04: 1 mg via INTRAVENOUS

## 2015-09-04 NOTE — ED Notes (Signed)
MD at bedside. 

## 2015-09-04 NOTE — Discharge Instructions (Signed)
Pelvic Mass A pelvic mass is an abnormal growth in the pelvis. The pelvis is the area between your hip bones. It includes the bladder and the rectum in males and females, and also the uterus and ovaries in females. CAUSES Many things can cause a pelvic mass, including:  Cancer.  Fibroids of the uterus.  Ovarian cysts.  Infection.  Ectopic pregnancy. SIGNS AND SYMPTOMS Symptoms of a pelvic mass may include:  Cramping.  Nausea.  Diarrhea.  Fever.  Vomiting.  Weakness.  Pain in the pelvis, side, or back.  Weight loss.  Constipation.  Problems with vaginal bleeding, including:  Light or heavy bleeding with or without blood clots.  Irregular menstruation.  Pain with menstruation.  Problems with urination, including:  Frequent urination.  Inability to empty the bladder completely.  Urinating very small amounts.  Pain with urination.  Bloody urine. Some pelvic masses do not cause symptoms. DIAGNOSIS To make a diagnosis, your health care provider will need to learn more about the mass. You may have tests or procedures done, such as:  Blood tests.  X-rays.  Ultrasound.  CT scan.  MRI.  A surgery to look inside of your abdomen with cameras (laparoscopy).  A biopsy that is performed with a needle or during laparoscopy or surgery. In some cases, what seemed like a pelvic mass may actually be something else, such as a mass in one of the organs that are near the pelvis, an infection (abscess) or scar tissue (adhesions) that formed after a surgery. TREATMENT Treatment will depend on the cause of the mass. HOME CARE INSTRUCTIONS What you need to do at home will depend on the cause of the mass. Follow the instructions that your health care provider gives to you. In general:  Keep all follow-up visits as directed by your health care provider. This is important.  Take medicines only as directed by your health care provider.  Follow any restrictions that  are given to you by your health care provider. SEEK MEDICAL CARE IF:  You develop new symptoms. SEEK IMMEDIATE MEDICAL CARE IF:  You vomit bright red blood or vomit material that looks like coffee grounds.  You have blood in your stools, or the stools turn black and tarry.  You have an abnormal or increased amount of vaginal bleeding.  You have a fever.  You develop easy bruising or bleeding.  You develop sudden or worsening pain that is not controlled by your medicine.  You feel worsening weakness, or you have a fainting episode.  You feel that the mass has suddenly gotten larger.  You develop severe bloating in your abdomen or your pelvis.  You cannot pass any urine.  You are unable to have a bowel movement.   This information is not intended to replace advice given to you by your health care provider. Make sure you discuss any questions you have with your health care provider.   Document Released: 05/11/2006 Document Revised: 02/22/2014 Document Reviewed: 09/17/2013 Elsevier Interactive Patient Education [employer].

## 2015-09-04 NOTE — ED Notes (Signed)
Patient transported to MRI 
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
## 2015-09-04 NOTE — ED Notes (Signed)
Discharge instructions reviewed with patient. Questions fielded by this RN. Patient verbalizes understanding of instructions. Patient discharged home in stable condition per Brown MD . No acute distress noted at time of discharge.   

## 2015-09-04 NOTE — ED Notes (Signed)
Pt given a Sprite and  Crackers and Sandwich tray.

## 2015-09-04 NOTE — ED Notes (Signed)
Patient transported back from CT 

## 2015-09-04 NOTE — ED Notes (Signed)
MD at bedside discussing the need for MRI.

## 2015-09-05 LAB — URINE CULTURE

## 2015-09-10 ENCOUNTER — Encounter: Payer: Self-pay | Admitting: Emergency Medicine

## 2015-09-10 ENCOUNTER — Emergency Department
Admission: EM | Admit: 2015-09-10 | Discharge: 2015-09-10 | Disposition: A | Discharge status: Home / Self Care | Payer: Self-pay | Attending: Emergency Medicine | Admitting: Emergency Medicine

## 2015-09-10 DIAGNOSIS — R102 Pelvic and perineal pain: Secondary | ICD-10-CM | POA: Insufficient documentation

## 2015-09-10 DIAGNOSIS — F17200 Nicotine dependence, unspecified, uncomplicated: Secondary | ICD-10-CM | POA: Insufficient documentation

## 2015-09-10 LAB — URINALYSIS COMPLETE WITH MICROSCOPIC (ARMC ONLY)
BILIRUBIN URINE: NEGATIVE
Bacteria, UA: NONE SEEN
GLUCOSE, UA: NEGATIVE mg/dL
HGB URINE DIPSTICK: NEGATIVE
Ketones, ur: NEGATIVE mg/dL
Nitrite: NEGATIVE
PH: 6 (ref 5.0–8.0)
Protein, ur: 30 mg/dL — AB
SPECIFIC GRAVITY, URINE: 1.016 (ref 1.005–1.030)

## 2015-09-10 LAB — COMPREHENSIVE METABOLIC PANEL
ALT: 27 U/L (ref 14–54)
ANION GAP: 11 (ref 5–15)
AST: 50 U/L — ABNORMAL HIGH (ref 15–41)
Albumin: 2.9 g/dL — ABNORMAL LOW (ref 3.5–5.0)
Alkaline Phosphatase: 127 U/L — ABNORMAL HIGH (ref 38–126)
BUN: <5 mg/dL — ABNORMAL LOW (ref 6–20)
CALCIUM: 8.7 mg/dL — AB (ref 8.9–10.3)
CHLORIDE: 107 mmol/L (ref 101–111)
CO2: 25 mmol/L (ref 22–32)
CREATININE: 0.52 mg/dL (ref 0.44–1.00)
GFR calc Af Amer: >60 mL/min (ref >60)
Glucose, Bld: 108 mg/dL — ABNORMAL HIGH (ref 65–99)
Potassium: 3.6 mmol/L (ref 3.5–5.1)
SODIUM: 143 mmol/L (ref 135–145)
Total Bilirubin: 1.8 mg/dL — ABNORMAL HIGH (ref 0.3–1.2)
Total Protein: 5.6 g/dL — ABNORMAL LOW (ref 6.5–8.1)

## 2015-09-10 LAB — CBC
HCT: 48.5 % — ABNORMAL HIGH (ref 35.0–47.0)
HEMOGLOBIN: 16.4 g/dL — AB (ref 12.0–16.0)
MCH: 36.6 pg — ABNORMAL HIGH (ref 26.0–34.0)
MCHC: 33.8 g/dL (ref 32.0–36.0)
MCV: 108.2 fL — AB (ref 80.0–100.0)
PLATELETS: 248 10*3/uL (ref 150–440)
RBC: 4.48 MIL/uL (ref 3.80–5.20)
RDW: 15.5 % — ABNORMAL HIGH (ref 11.5–14.5)
WBC: 10.2 10*3/uL (ref 3.6–11.0)

## 2015-09-10 LAB — LIPASE, BLOOD: LIPASE: 26 U/L (ref 11–51)

## 2015-09-10 MED ORDER — HYDROCODONE-ACETAMINOPHEN 5-325 MG PO TABS: 1.0000 | ORAL_TABLET | ORAL | 0 refills | Status: DC | PRN

## 2015-09-10 MED ORDER — ONDANSETRON HCL 4 MG PO TABS: ORAL_TABLET | ORAL | 0 refills | Status: DC

## 2015-09-10 NOTE — ED Provider Notes (Signed)
[facility] Emergency Department Provider Note  ____________________________________________   First MD Initiated Contact with Patient 09/10/15 1620     (approximate)  I have reviewed the triage vital signs and the nursing notes.   HISTORY  Chief Complaint Abdominal Pain    HPI Claire Brown is a 55 y.o. female with no prior chronic medical problems who presents for persistent diffuse lower abdominal pain.  She was extensively evaluated 1 week ago by Drs. Gayle and brown.  After obtaining a CT scan of her abdomen pelvis and an MRI of the abdomen, she was found to have fibroids and a concerning looking adnexal cyst.  She was referred to Dr. Star Age for GYN follow-up.  She has not yet then to the clinic but she has an appointment that she believes is in 8 days.  She ran out of pain medicine and states that the pain is severe, waxing and waning in intensity but constant, present throughout her lower abdomen but more on the right side, and movement makes it worse.  She denies fever/chills, chest pain, shortness of breath, dysuria, vomiting although she has had some nausea.The patient states that the symptoms are consistent with prior and are not significantly worse, she simply has no additional pain medicine to help control the symptoms.   History reviewed. No pertinent past medical history.  There are no active problems to display for this patient.   Past Surgical History:  Procedure Laterality Date  . CESAREAN SECTION    . KNEE SURGERY      Prior to Admission medications   Medication Sig Start Date End Date Taking? Authorizing Provider  dicyclomine (BENTYL) 10 MG capsule Take 1 capsule (10 mg total) by mouth 4 (four) times daily. 06/21/14 07/05/14  Orbie Pyo, MD  diphenhydrAMINE (BENADRYL) 25 MG tablet Take 25 mg by mouth every 6 (six) hours as needed for allergies.    Historical Provider, MD  HYDROcodone-acetaminophen (NORCO/VICODIN) 5-325  MG tablet Take 1-2 tablets by mouth every 4 (four) hours as needed for moderate pain. 09/10/15   Hinda Kehr, MD  meclizine (ANTIVERT) 25 MG tablet Take 25 mg by mouth 3 (three) times daily as needed for dizziness or nausea.    Historical Provider, MD  ondansetron (ZOFRAN) 4 MG tablet Take 1-2 tabs by mouth every 8 hours as needed for nausea/vomiting 09/10/15   Hinda Kehr, MD  triamcinolone (NASACORT) 55 MCG/ACT AERO nasal inhaler Place 2 sprays into the nose daily.    Historical Provider, MD    Allergies Augmentin [amoxicillin-pot clavulanate]; Penicillins; and Valium [diazepam]  History reviewed. No pertinent family history.  Social History Social History  Substance Use Topics  . Smoking status: Current Every Day Smoker    Packs/day: 1.00  . Smokeless tobacco: Never Used  . Alcohol use Yes     Comment: OCC    Review of Systems Constitutional: No fever/chills Eyes: No visual changes. ENT: No sore throat. Cardiovascular: Denies chest pain. Respiratory: Denies shortness of breath. Gastrointestinal:  +abdominal pain.  nausea, no vomiting.  No diarrhea.  No constipation. Genitourinary: Negative for dysuria. Musculoskeletal: Negative for back pain. Skin: Negative for rash. Neurological: Negative for headaches, focal weakness or numbness.  10-point ROS otherwise negative.  ____________________________________________   PHYSICAL EXAM:  VITAL SIGNS: ED Triage Vitals  Enc Vitals Group     BP --      Pulse Rate 09/10/15 1345 (!) 109     Resp 09/10/15 1345 16  Temp 09/10/15 1345 98.5 F (36.9 C)     Temp Source 09/10/15 1345 Oral     SpO2 09/10/15 1345 99 %     Weight 09/10/15 1345 127 lb (57.6 kg)     Height 09/10/15 1345 5' (1.524 m)     Head Circumference --      Peak Flow --      Pain Score 09/10/15 1340 8     Pain Loc --      Pain Edu? --      Excl. in [place]? --     Constitutional: Alert and oriented. Well appearing and in no acute distress. Eyes: Conjunctivae  are normal. PERRL. EOMI. Head: Atraumatic. Nose: No congestion/rhinnorhea. Mouth/Throat: Mucous membranes are moist.  Oropharynx non-erythematous. Neck: No stridor.  No meningeal signs.   Cardiovascular: Normal rate, regular rhythm. Good peripheral circulation. Grossly normal heart sounds.   Respiratory: Normal respiratory effort.  No retractions. Lungs CTAB. Gastrointestinal: Soft with diffuse abdominal tenderness throughout her lower abdomen but no focal tenderness.  No rebound or guarding. Genitourinary: Deferred to GYN follow-up Musculoskeletal: No lower extremity tenderness nor edema. No gross deformities of extremities. Neurologic:  Normal speech and language. No gross focal neurologic deficits are appreciated.  Skin:  Skin is warm, dry and intact. No rash noted. Psychiatric: Mood and affect are normal. Speech and behavior are normal.  ____________________________________________   LABS (all labs ordered are listed, but only abnormal results are displayed)  Labs Reviewed  COMPREHENSIVE METABOLIC PANEL - Abnormal; Notable for the following:       Result Value   Glucose, Bld 108 (*)    BUN <5 (*)    Calcium 8.7 (*)    Total Protein 5.6 (*)    Albumin 2.9 (*)    AST 50 (*)    Alkaline Phosphatase 127 (*)    Total Bilirubin 1.8 (*)    All other components within normal limits  CBC - Abnormal; Notable for the following:    Hemoglobin 16.4 (*)    HCT 48.5 (*)    MCV 108.2 (*)    MCH 36.6 (*)    RDW 15.5 (*)    All other components within normal limits  URINALYSIS COMPLETEWITH MICROSCOPIC ([facility] ONLY) - Abnormal; Notable for the following:    Color, Urine AMBER (*)    APPearance CLEAR (*)    Protein, ur 30 (*)    Leukocytes, UA TRACE (*)    Squamous Epithelial / LPF 0-5 (*)    All other components within normal limits  LIPASE, BLOOD   ____________________________________________  EKG  None ____________________________________________  RADIOLOGY   No results  found.  ____________________________________________   PROCEDURES  Procedure(s) performed:   Procedures   ____________________________________________   INITIAL IMPRESSION / ASSESSMENT AND PLAN / ED COURSE  Pertinent labs & imaging results that were available during my care of the patient were reviewed by me and considered in my medical decision making (see chart for details).  I expressed my concern about the patient needing close outpatient follow-up and she verified on her phone and that her appointment with Dr. Star Age is actually tomorrow, not in 8 days.  She was relieved to discover that she did not miss her appointment and that it is coming up so soon.  Given the close follow-up and the extensive workup she had just one week ago, I am deferring additional workup today to the specialist tomorrow.  Her vital signs are stable and her lab work is unremarkable.  I reiterated to her how important it is that she follow up as scheduled and she definitely plans to do so.  I gave her prescription for 15 additional Norco but explained that from now on her pain will need to be managed by the specialist.  She understands and agrees with the plan.  I gave my usual and customary return precautions.     Clinical Course    ____________________________________________  FINAL CLINICAL IMPRESSION(S) / ED DIAGNOSES  Final diagnoses:  Pelvic pain in female     MEDICATIONS GIVEN DURING THIS VISIT:  Medications - No data to display   NEW OUTPATIENT MEDICATIONS STARTED DURING THIS VISIT:  Discharge Medication List as of 09/10/2015  4:57 PM    START taking these medications   Details  ondansetron (ZOFRAN) 4 MG tablet Take 1-2 tabs by mouth every 8 hours as needed for nausea/vomiting, Print          Note:  This document was prepared using Dragon voice recognition software and may include unintentional dictation errors.    Hinda Kehr, MD 09/10/15 [phone]

## 2015-09-10 NOTE — ED Triage Notes (Signed)
Pt to ed with c/o abd pain x 3 weeks intermittently.  Pt also reports n/v/d.

## 2015-09-10 NOTE — ED Notes (Signed)
Unable to urinate

## 2015-09-10 NOTE — Discharge Instructions (Signed)
As we discussed, based on your workup in the emergency Department last week, it is very important that he follow up with your GYN specialist tomorrow as scheduled.  He will be able to assist you with further evaluation of the abnormalities found on the CT scan and MRI obtained last week.  Take Norco as prescribed for severe pain. Do not drink alcohol, drive or participate in any other potentially dangerous activities while taking this medication as it may make you sleepy. Do not take this medication with any other sedating medications, either prescription or over-the-counter. If you were prescribed Percocet or Vicodin, do not take these with acetaminophen (Tylenol) as it is already contained within these medications.   This medication is an opiate (or narcotic) pain medication and can be habit forming.  Use it as little as possible to achieve adequate pain control.  Do not use or use it with extreme caution if you have a history of opiate abuse or dependence.  If you are on a pain contract with your primary care doctor or a pain specialist, be sure to let them know you were prescribed this medication today from the [facility] Emergency Department.  This medication is intended for your use only - do not give any to anyone else and keep it in a secure place where nobody else, especially children, have access to it.  It will also cause or worsen constipation, so you may want to consider taking an over-the-counter stool softener while you are taking this medication.    Return to the emergency department if you develop new or worsening symptoms that concern you.

## 2015-09-10 NOTE — ED Notes (Signed)
Pt to ED for abd pain, was diagnosed  x1 week ago with a ovarian cyst and fibroid tumors in uterus , pt c/o increased pain and nausea. Pt denies vomiting or diarrhea. Pt A&O

## 2015-09-24 ENCOUNTER — Inpatient Hospital Stay: Payer: Self-pay | Attending: Obstetrics and Gynecology | Admitting: Obstetrics and Gynecology

## 2015-09-24 ENCOUNTER — Inpatient Hospital Stay: Payer: Self-pay

## 2015-09-24 VITALS — BP 100/69 | HR 97 | Temp 98.0°F | Ht 60.0 in | Wt 124.4 lb

## 2015-09-24 DIAGNOSIS — N838 Other noninflammatory disorders of ovary, fallopian tube and broad ligament: Secondary | ICD-10-CM

## 2015-09-24 DIAGNOSIS — R102 Pelvic and perineal pain: Secondary | ICD-10-CM | POA: Insufficient documentation

## 2015-09-24 DIAGNOSIS — R634 Abnormal weight loss: Secondary | ICD-10-CM | POA: Insufficient documentation

## 2015-09-24 DIAGNOSIS — D252 Subserosal leiomyoma of uterus: Secondary | ICD-10-CM | POA: Insufficient documentation

## 2015-09-24 DIAGNOSIS — R197 Diarrhea, unspecified: Secondary | ICD-10-CM

## 2015-09-24 DIAGNOSIS — F17210 Nicotine dependence, cigarettes, uncomplicated: Secondary | ICD-10-CM | POA: Insufficient documentation

## 2015-09-24 DIAGNOSIS — D3911 Neoplasm of uncertain behavior of right ovary: Secondary | ICD-10-CM | POA: Insufficient documentation

## 2015-09-24 NOTE — Progress Notes (Signed)
Patient here for surgical consult. She has severe pain in her lower abdomen for the past several weeks. She describes the pain as cramping and continuous. No Spotting. She has diarrhea that has been occurring for weeks. She has decreased appetite. She has lost 20 pounds six week.

## 2015-09-24 NOTE — Progress Notes (Signed)
  Oncology Nurse Navigator Documentation  Navigator Location: [facility] (09/24/15 1500) Navigator Encounter Type:  (Gyn Onc) (09/24/15 1500)   Abnormal Finding Date: 09/03/15 (09/24/15 1500)       Patient Visit Type: Initial (Gyn Onc) (09/24/15 1500) Treatment Phase: Abnormal Scans (09/24/15 1500) Barriers/Navigation Needs: Coordination of Care (09/24/15 1500)   Interventions: Coordination of Care (09/24/15 1500)   Coordination of Care: Appts (09/24/15 1500)        Acuity: Level 2 (09/24/15 1500)         Time Spent with Patient: 30 (09/24/15 1500)   Chaperoned pelvic exam. Notified that pain/nausea medicine would need to come from Dr Star Age. They will be calling her to make appt to arrange for her surgery in the next day or so.
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
## 2015-10-06 ENCOUNTER — Emergency Department: Payer: Self-pay

## 2015-10-06 ENCOUNTER — Emergency Department
Admission: EM | Admit: 2015-10-06 | Discharge: 2015-10-06 | Disposition: A | Discharge status: Home / Self Care | Payer: Self-pay | Attending: Emergency Medicine | Admitting: Emergency Medicine

## 2015-10-06 DIAGNOSIS — Y929 Unspecified place or not applicable: Secondary | ICD-10-CM | POA: Insufficient documentation

## 2015-10-06 DIAGNOSIS — F17200 Nicotine dependence, unspecified, uncomplicated: Secondary | ICD-10-CM | POA: Insufficient documentation

## 2015-10-06 DIAGNOSIS — M79603 Pain in arm, unspecified: Secondary | ICD-10-CM

## 2015-10-06 DIAGNOSIS — W010XXA Fall on same level from slipping, tripping and stumbling without subsequent striking against object, initial encounter: Secondary | ICD-10-CM | POA: Insufficient documentation

## 2015-10-06 DIAGNOSIS — Z5181 Encounter for therapeutic drug level monitoring: Secondary | ICD-10-CM | POA: Insufficient documentation

## 2015-10-06 DIAGNOSIS — Z791 Long term (current) use of non-steroidal anti-inflammatories (NSAID): Secondary | ICD-10-CM | POA: Insufficient documentation

## 2015-10-06 DIAGNOSIS — Y999 Unspecified external cause status: Secondary | ICD-10-CM | POA: Insufficient documentation

## 2015-10-06 DIAGNOSIS — S2232XA Fracture of one rib, left side, initial encounter for closed fracture: Secondary | ICD-10-CM | POA: Insufficient documentation

## 2015-10-06 DIAGNOSIS — Y9389 Activity, other specified: Secondary | ICD-10-CM | POA: Insufficient documentation

## 2015-10-06 DIAGNOSIS — M79631 Pain in right forearm: Secondary | ICD-10-CM | POA: Insufficient documentation

## 2015-10-06 DIAGNOSIS — F10920 Alcohol use, unspecified with intoxication, uncomplicated: Secondary | ICD-10-CM

## 2015-10-06 DIAGNOSIS — R410 Disorientation, unspecified: Secondary | ICD-10-CM | POA: Insufficient documentation

## 2015-10-06 DIAGNOSIS — F10120 Alcohol abuse with intoxication, uncomplicated: Secondary | ICD-10-CM | POA: Insufficient documentation

## 2015-10-06 LAB — URINE DRUG SCREEN, QUALITATIVE (ARMC ONLY)
Amphetamines, Ur Screen: NOT DETECTED
BARBITURATES, UR SCREEN: NOT DETECTED
BENZODIAZEPINE, UR SCRN: NOT DETECTED
COCAINE METABOLITE, UR ~~LOC~~: NOT DETECTED
Cannabinoid 50 Ng, Ur ~~LOC~~: NOT DETECTED
MDMA (Ecstasy)Ur Screen: NOT DETECTED
METHADONE SCREEN, URINE: NOT DETECTED
Opiate, Ur Screen: NOT DETECTED
Phencyclidine (PCP) Ur S: NOT DETECTED
TRICYCLIC, UR SCREEN: NOT DETECTED

## 2015-10-06 LAB — CBC
HCT: 41.6 % (ref 35.0–47.0)
Hemoglobin: 14.1 g/dL (ref 12.0–16.0)
MCH: 38.5 pg — ABNORMAL HIGH (ref 26.0–34.0)
MCHC: 34 g/dL (ref 32.0–36.0)
MCV: 113.4 fL — ABNORMAL HIGH (ref 80.0–100.0)
PLATELETS: 286 10*3/uL (ref 150–440)
RBC: 3.67 MIL/uL — ABNORMAL LOW (ref 3.80–5.20)
RDW: 17.9 % — AB (ref 11.5–14.5)
WBC: 7.8 10*3/uL (ref 3.6–11.0)

## 2015-10-06 LAB — ACETAMINOPHEN LEVEL

## 2015-10-06 LAB — COMPREHENSIVE METABOLIC PANEL
ALBUMIN: 2.7 g/dL — AB (ref 3.5–5.0)
ALT: 40 U/L (ref 14–54)
ANION GAP: 4 — AB (ref 5–15)
AST: 68 U/L — ABNORMAL HIGH (ref 15–41)
Alkaline Phosphatase: 155 U/L — ABNORMAL HIGH (ref 38–126)
BILIRUBIN TOTAL: 0.8 mg/dL (ref 0.3–1.2)
BUN: <5 mg/dL — ABNORMAL LOW (ref 6–20)
CO2: 28 mmol/L (ref 22–32)
Calcium: 8.6 mg/dL — ABNORMAL LOW (ref 8.9–10.3)
Chloride: 111 mmol/L (ref 101–111)
Creatinine, Ser: 0.34 mg/dL — ABNORMAL LOW (ref 0.44–1.00)
GFR calc non Af Amer: >60 mL/min (ref >60)
GLUCOSE: 82 mg/dL (ref 65–99)
POTASSIUM: 5.1 mmol/L (ref 3.5–5.1)
Sodium: 143 mmol/L (ref 135–145)
TOTAL PROTEIN: 5.6 g/dL — AB (ref 6.5–8.1)

## 2015-10-06 LAB — SALICYLATE LEVEL

## 2015-10-06 LAB — TROPONIN I

## 2015-10-06 LAB — ETHANOL: ALCOHOL ETHYL (B): 99 mg/dL — AB (ref <5)

## 2015-10-06 MED ORDER — ETODOLAC 200 MG PO CAPS: 200.0000 mg | ORAL_CAPSULE | Freq: Three times a day (TID) | ORAL | 0 refills | Status: DC

## 2015-10-06 MED ORDER — LORAZEPAM 0.5 MG PO TABS
0.5000 mg | ORAL_TABLET | Freq: Once | ORAL | Status: AC
  Administered 2015-10-06: 0.5 mg via ORAL
  Filled 2015-10-06: qty 1

## 2015-10-06 MED ORDER — KETOROLAC TROMETHAMINE 30 MG/ML IJ SOLN
30.0000 mg | Freq: Once | INTRAMUSCULAR | Status: AC
  Administered 2015-10-06: 30 mg via INTRAVENOUS
  Filled 2015-10-06: qty 1

## 2015-10-06 MED ORDER — TRAMADOL HCL 50 MG PO TABS: 50.0000 mg | ORAL_TABLET | Freq: Four times a day (QID) | ORAL | 0 refills | Status: DC | PRN

## 2015-10-06 NOTE — ED Triage Notes (Signed)
Patient reports was tripped by her dogs and fell.  Patient with bruising to her arms, reports pain to right rib area and to left knee.

## 2015-10-06 NOTE — ED Notes (Signed)
Pt is presently very pleasant and compliant.

## 2015-10-06 NOTE — ED Notes (Signed)
Pt changed into paper scrubs. Belongings locked room in [place].

## 2015-10-06 NOTE — ED Notes (Signed)
Attempt to start PIV x 1 unsuccessfully. Pt began to take off her monitors for the 3rd time since her arrival, complaining that it was taking too long and now she was walking home. Pt had already been counseled regarding asking for assistance to get out of bed, unsuccessfully. Pt then further asked why we had not taken a chest xray. She was adamant that she had not left her treatment room, nor had she had a chest xray done. Pt was trying to ambulate and was bending over, extremely unsteady. Pt asked to return to the stretcher so we could finish obtaining blood, starting an IV and treat her for her pain. Pt stated she was leaving. CN and EDP called to room. EDP explained to her that

## 2015-10-06 NOTE — ED Notes (Signed)
Pt has refused to have xrays done of other indicated injuries on knee and arm. Pt attempting to get out of bed and walk around, was unsteady and asked to return to the stretcher rather than walk. Pt argumentative and unwilling to comply w/ safety requests. Pt is presently sitting on the side of the bed. Pt has security outside room. Pt is an elopement risk.

## 2015-10-06 NOTE — ED Provider Notes (Signed)
[facility] Emergency Department Provider Note   ____________________________________________   First MD Initiated Contact with Patient 10/06/15 [phone]     (approximate)  I have reviewed the triage vital signs and the nursing notes.   HISTORY  Chief Complaint Fall    HPI Claire Brown is a 55 y.o. female who comes into the hospital today with rib painand abdominal pain. She reports that her abdominal pain is a 3-4 out of 10 and the rib pain a 7 out of 10 in intensity. The patient reports that she fell on Tuesday and hit her left ribs. She reports that the pain didn't start until later. She reports that she had to care for her in-line no one would bring her into the hospital until today. The patient has a history of fibroids and ovarian tumors and has a hysterectomy scheduled for 8/29. The patient reports that she was trying to catch her dogs who were [place] Husky's and she tripped and fell flat on her stomach. She reports that she hit her left chest in her right forearm and her right knee. She reports that she didn't hit her head and denies a loss of consciousness. The patient reports that she's been taking aspirin at home but it has not been helping with her pain. The patient is here tonight to evaluate for rib fracture. The patient is tearful during the exam.   No past medical history  Patient Active Problem List   Diagnosis Date Noted  . Ovarian mass, right 09/24/2015    Past Surgical History:  Procedure Laterality Date  . CESAREAN SECTION    . KNEE SURGERY      Prior to Admission medications   Medication Sig Start Date End Date Taking? Authorizing Provider  diphenhydrAMINE (BENADRYL) 25 MG tablet Take 25 mg by mouth every 6 (six) hours as needed for allergies.    Historical Provider, MD  etodolac (LODINE) 200 MG capsule Take 1 capsule (200 mg total) by mouth every 8 (eight) hours. 10/06/15   Loney Hering, MD  HYDROcodone-acetaminophen  (NORCO/VICODIN) 5-325 MG tablet Take 1-2 tablets by mouth every 4 (four) hours as needed for moderate pain. 09/10/15   Hinda Kehr, MD  meclizine (ANTIVERT) 25 MG tablet Take 25 mg by mouth 3 (three) times daily as needed for dizziness or nausea.    Historical Provider, MD  ondansetron (ZOFRAN) 4 MG tablet Take 1-2 tabs by mouth every 8 hours as needed for nausea/vomiting 09/10/15   Hinda Kehr, MD  traMADol (ULTRAM) 50 MG tablet Take 1 tablet (50 mg total) by mouth every 6 (six) hours as needed. 10/06/15   Loney Hering, MD    Allergies Augmentin [amoxicillin-pot clavulanate]; Penicillins; and Valium [diazepam]  Family History  Problem Relation Age of Onset  . Cancer Brother     loyphoma    Social History Social History  Substance Use Topics  . Smoking status: Current Every Day Smoker    Packs/day: 1.00  . Smokeless tobacco: Never Used  . Alcohol use Yes     Comment: OCC    Review of Systems Constitutional: No fever/chills Eyes: No visual changes. ENT: No sore throat. Cardiovascular: Left chest pain. Respiratory:  shortness of breath. Gastrointestinal:  abdominal pain.  No nausea, no vomiting.  No diarrhea.  No constipation. Genitourinary: Negative for dysuria. Musculoskeletal: Negative for back pain. Skin: Negative for rash. Neurological: Negative for headaches, focal weakness or numbness.  10-point ROS otherwise negative.  ____________________________________________   PHYSICAL  EXAM:  VITAL SIGNS: ED Triage Vitals [10/06/15 0213]  Enc Vitals Group     BP (!) 103/58     Pulse Rate 97     Resp 20     Temp 98.1 F (36.7 C)     Temp src      SpO2 97 %     Weight      Height      Head Circumference      Peak Flow      Pain Score 5     Pain Loc      Pain Edu?      Excl. in [place]?     Constitutional: Alert And oriented to self and date Patient does not know where she is. Disheveled appearing and tearful in some moderate distress Eyes: Conjunctivae are  normal. PERRL. EOMI. Head: Atraumatic. Nose: No congestion/rhinnorhea. Mouth/Throat: Mucous membranes are moist.  Oropharynx non-erythematous. Cardiovascular: Normal rate, regular rhythm. Grossly normal heart sounds.  Good peripheral circulation. Respiratory: Normal respiratory effort.  No retractions. Crackles in left lung base Gastrointestinal: Soft with diffuse tenderness to palpation. No distention. Positive bowel sounds Musculoskeletal: No lower extremity tenderness nor edema.  Abrasion to right knee abrasions to left forearm Neurologic:  Normal speech and language. No gross focal neurologic deficits are appreciated. No gait instability. Skin:  Skin is warm, dry and intact. No rash noted. Psychiatric: Mood and affect are normal. Speech and behavior are normal.  ____________________________________________   LABS (all labs ordered are listed, but only abnormal results are displayed)  Labs Reviewed  CBC - Abnormal; Notable for the following:       Result Value   RBC 3.67 (*)    MCV 113.4 (*)    MCH 38.5 (*)    RDW 17.9 (*)    All other components within normal limits  COMPREHENSIVE METABOLIC PANEL - Abnormal; Notable for the following:    BUN <5 (*)    Creatinine, Ser 0.34 (*)    Calcium 8.6 (*)    Total Protein 5.6 (*)    Albumin 2.7 (*)    AST 68 (*)    Alkaline Phosphatase 155 (*)    Anion gap 4 (*)    All other components within normal limits  ETHANOL - Abnormal; Notable for the following:    Alcohol, Ethyl (B) 99 (*)    All other components within normal limits  ACETAMINOPHEN LEVEL - Abnormal; Notable for the following:    Acetaminophen (Tylenol), Serum <10 (*)    All other components within normal limits  TROPONIN I  URINE DRUG SCREEN, QUALITATIVE ([facility] ONLY)  SALICYLATE LEVEL    ____________________________________________  EKG  none ____________________________________________  RADIOLOGY  CXR ____________________________________________   PROCEDURES  Procedure(s) performed: None  Procedures  Critical Care performed: No  ____________________________________________   INITIAL IMPRESSION / ASSESSMENT AND PLAN / ED COURSE  Pertinent labs & imaging results that were available during my care of the patient were reviewed by me and considered in my medical decision making (see chart for details).  This is a 55 year old female who comes into the hospital today with some left rib pain after a fall on Tuesday. We will do an x-ray. The patient is very tearful and said she is fearful for herself at home. She is complaining of some arm pain as well as knee pain. The patient is concerned about a ovarian mass for which she has to have surgery in one week. The patient is here for evaluation of these  symptoms. She is hypoxic as read talk to her with sats to go down into the 80s. We did place the patient on some O2.  Clinical Course  Value Comment By Time  CT Head Wo Contrast Normal head CT. Loney Hering, MD 08/21 0708  DG Chest 2 View Minimally displaced left lateral fifth rib fracture. No pulmonary complication.   Loney Hering, MD 08/21 [phone]   At this time the patient is awake and she is appropriate. She is no longer confused. She reports that she hadn't been sleeping well so that could've contributed to her confusion. The patient's imaging is unremarkable and the patient's blood work aside from elevated ethanol level is unremarkable. The patient will be discharged home.  ____________________________________________   FINAL CLINICAL IMPRESSION(S) / ED DIAGNOSES  Final diagnoses:  Arm pain  Rib fracture, left, closed, initial encounter  Alcohol intoxication, uncomplicated (HCC)  Confusion      NEW MEDICATIONS STARTED DURING THIS  VISIT:  New Prescriptions   ETODOLAC (LODINE) 200 MG CAPSULE    Take 1 capsule (200 mg total) by mouth every 8 (eight) hours.   TRAMADOL (ULTRAM) 50 MG TABLET    Take 1 tablet (50 mg total) by mouth every 6 (six) hours as needed.     Note:  This document was prepared using Dragon voice recognition software and may include unintentional dictation errors.    Loney Hering, MD 10/06/15 [phone]

## 2015-10-06 NOTE — ED Notes (Signed)
Pt's personal hygiene is very poor and she appears to have mud or feces on her feet and in her shoes.
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
## 2015-10-09 ENCOUNTER — Encounter
Admission: RE | Admit: 2015-10-09 | Discharge: 2015-10-09 | Disposition: A | Discharge status: Home / Self Care | Payer: Self-pay | Source: Ambulatory Visit | Attending: Obstetrics and Gynecology | Admitting: Obstetrics and Gynecology

## 2015-10-09 DIAGNOSIS — Z01812 Encounter for preprocedural laboratory examination: Secondary | ICD-10-CM | POA: Insufficient documentation

## 2015-10-09 DIAGNOSIS — Z01810 Encounter for preprocedural cardiovascular examination: Secondary | ICD-10-CM | POA: Insufficient documentation

## 2015-10-09 DIAGNOSIS — N840 Polyp of corpus uteri: Secondary | ICD-10-CM | POA: Insufficient documentation

## 2015-10-09 DIAGNOSIS — N83201 Unspecified ovarian cyst, right side: Secondary | ICD-10-CM | POA: Insufficient documentation

## 2015-10-09 HISTORY — DX: Unspecified osteoarthritis, unspecified site: M19.90

## 2015-10-09 HISTORY — DX: Anxiety disorder, unspecified: F41.9

## 2015-10-09 HISTORY — DX: Anemia, unspecified: D64.9

## 2015-10-09 HISTORY — DX: Bipolar disorder, unspecified: F31.9

## 2015-10-09 HISTORY — DX: Headache, unspecified: R51.9

## 2015-10-09 HISTORY — DX: Headache: R51

## 2015-10-09 NOTE — Pre-Procedure Instructions (Signed)
Dr. Georgianne Fick office called (spoke to [place]) and stated to override and proceed with Ancef order.

## 2015-10-09 NOTE — Patient Instructions (Signed)
  Your procedure is scheduled on: August 29. 2017 (Tuesday) Report to Same Day Surgery 2nd floor medical mall To find out your arrival time please call [phone] between 1PM - 3PM on October 13, 2015 (Monday)  Remember: Instructions that are not followed completely may result in serious medical risk, up to and including death, or upon the discretion of your surgeon and anesthesiologist your surgery may need to be rescheduled.    _x___ 1. Do not eat food or drink liquids after midnight. No gum chewing or hard candies.     _x___ 2. No Alcohol for 24 hours before or after surgery.   _x__3. No Smoking for 24 prior to surgery.   ____  4. Bring all medications with you on the day of surgery if instructed.    __x__ 5. Notify your doctor if there is any change in your medical condition     (cold, fever, infections).     Do not wear jewelry, make-up, hairpins, clips or nail polish.  Do not wear lotions, powders, or perfumes. You may wear deodorant.  Do not shave 48 hours prior to surgery. Men may shave face and neck.  Do not bring valuables to the hospital.    [facility] is not responsible for any belongings or valuables.               Contacts, dentures or bridgework may not be worn into surgery.  Leave your suitcase in the car. After surgery it may be brought to your room.  For patients admitted to the hospital, discharge time is determined by your treatment team.   Patients discharged the day of surgery will not be allowed to drive home.    Please read over the following fact sheets that you were given:   [facility] Preparing for Surgery and or MRSA Information   __ Take these medicines the morning of surgery with A SIP OF WATER:    1.   2.  3.  4.  5.  6.  ____ Fleet Enema (as directed)   _x___ Use CHG Soap or sage wipes as directed on instruction sheet   ____ Use inhalers on the day of surgery and bring to hospital day of surgery  ____ Stop metformin 2 days prior to  surgery    ____ Take 1/2 of usual insulin dose the night before surgery and none on the morning of surgery             ____ Stop aspirin or coumadin, or plavix (NO ASPIRIN)  _x__ Stop Anti-inflammatories such as Advil, Aleve, Ibuprofen, Motrin, Naproxen, BC          Naprosyn, Goodies powders or aspirin products. Ok to take Tylenol, may take Tramadol if needed.   ____ Stop supplements until after surgery.    ____ Bring C-Pap to the hospital.

## 2015-10-09 NOTE — Pre-Procedure Instructions (Signed)
EKG COMPARED WITH EKG 2016

## 2015-10-10 LAB — URINE DRUG SCREEN, QUALITATIVE (ARMC ONLY)
Amphetamines, Ur Screen: NOT DETECTED
BARBITURATES, UR SCREEN: NOT DETECTED
BENZODIAZEPINE, UR SCRN: POSITIVE — AB
CANNABINOID 50 NG, UR ~~LOC~~: NOT DETECTED
COCAINE METABOLITE, UR ~~LOC~~: POSITIVE — AB
MDMA (Ecstasy)Ur Screen: NOT DETECTED
METHADONE SCREEN, URINE: NOT DETECTED
OPIATE, UR SCREEN: NOT DETECTED
PHENCYCLIDINE (PCP) UR S: NOT DETECTED
Tricyclic, Ur Screen: POSITIVE — AB

## 2015-10-10 LAB — CBC
HCT: 46.8 % (ref 35.0–47.0)
HEMOGLOBIN: 15.5 g/dL (ref 12.0–16.0)
MCH: 39.7 pg — AB (ref 26.0–34.0)
MCHC: 33.1 g/dL (ref 32.0–36.0)
MCV: 119.9 fL — ABNORMAL HIGH (ref 80.0–100.0)
Platelets: 394 10*3/uL (ref 150–440)
RBC: 3.9 MIL/uL (ref 3.80–5.20)
RDW: 19.7 % — AB (ref 11.5–14.5)
WBC: 13.9 10*3/uL — ABNORMAL HIGH (ref 3.6–11.0)

## 2015-10-11 LAB — HEPATITIS C ANTIBODY: HCV Ab: <0.1 s/co ratio (ref 0.0–0.9)

## 2015-10-14 ENCOUNTER — Observation Stay
Admission: RE | Admit: 2015-10-14 | Discharge: 2015-10-15 | Disposition: A | Discharge status: Home / Self Care | Payer: Self-pay | Source: Ambulatory Visit | Attending: Obstetrics and Gynecology | Admitting: Obstetrics and Gynecology

## 2015-10-14 ENCOUNTER — Encounter: Payer: Self-pay | Admitting: *Deleted

## 2015-10-14 ENCOUNTER — Ambulatory Visit: Payer: Self-pay | Admitting: Certified Registered Nurse Anesthetist

## 2015-10-14 ENCOUNTER — Encounter
Admission: RE | Disposition: A | Discharge status: Home / Self Care | Payer: Self-pay | Source: Ambulatory Visit | Attending: Obstetrics and Gynecology

## 2015-10-14 DIAGNOSIS — N9489 Other specified conditions associated with female genital organs and menstrual cycle: Principal | ICD-10-CM | POA: Insufficient documentation

## 2015-10-14 DIAGNOSIS — N838 Other noninflammatory disorders of ovary, fallopian tube and broad ligament: Secondary | ICD-10-CM | POA: Insufficient documentation

## 2015-10-14 DIAGNOSIS — D259 Leiomyoma of uterus, unspecified: Secondary | ICD-10-CM | POA: Insufficient documentation

## 2015-10-14 DIAGNOSIS — D270 Benign neoplasm of right ovary: Secondary | ICD-10-CM | POA: Insufficient documentation

## 2015-10-14 DIAGNOSIS — Z880 Allergy status to penicillin: Secondary | ICD-10-CM | POA: Insufficient documentation

## 2015-10-14 DIAGNOSIS — Z79899 Other long term (current) drug therapy: Secondary | ICD-10-CM | POA: Insufficient documentation

## 2015-10-14 DIAGNOSIS — M1990 Unspecified osteoarthritis, unspecified site: Secondary | ICD-10-CM | POA: Insufficient documentation

## 2015-10-14 DIAGNOSIS — Z885 Allergy status to narcotic agent status: Secondary | ICD-10-CM | POA: Insufficient documentation

## 2015-10-14 DIAGNOSIS — Z90710 Acquired absence of both cervix and uterus: Secondary | ICD-10-CM | POA: Diagnosis present

## 2015-10-14 DIAGNOSIS — Z881 Allergy status to other antibiotic agents status: Secondary | ICD-10-CM | POA: Insufficient documentation

## 2015-10-14 DIAGNOSIS — F329 Major depressive disorder, single episode, unspecified: Secondary | ICD-10-CM | POA: Insufficient documentation

## 2015-10-14 DIAGNOSIS — Z888 Allergy status to other drugs, medicaments and biological substances status: Secondary | ICD-10-CM | POA: Insufficient documentation

## 2015-10-14 DIAGNOSIS — F419 Anxiety disorder, unspecified: Secondary | ICD-10-CM | POA: Insufficient documentation

## 2015-10-14 DIAGNOSIS — F17200 Nicotine dependence, unspecified, uncomplicated: Secondary | ICD-10-CM | POA: Insufficient documentation

## 2015-10-14 DIAGNOSIS — N800 Endometriosis of uterus: Secondary | ICD-10-CM | POA: Insufficient documentation

## 2015-10-14 DIAGNOSIS — D649 Anemia, unspecified: Secondary | ICD-10-CM | POA: Insufficient documentation

## 2015-10-14 HISTORY — PX: CYSTOSCOPY: SHX5120

## 2015-10-14 HISTORY — PX: LAPAROSCOPIC BILATERAL SALPINGO OOPHERECTOMY: SHX5890

## 2015-10-14 HISTORY — PX: LAPAROSCOPIC HYSTERECTOMY: SHX1926

## 2015-10-14 LAB — URINE DRUG SCREEN, QUALITATIVE (ARMC ONLY)
Amphetamines, Ur Screen: NOT DETECTED
BARBITURATES, UR SCREEN: NOT DETECTED
BENZODIAZEPINE, UR SCRN: NOT DETECTED
CANNABINOID 50 NG, UR ~~LOC~~: NOT DETECTED
COCAINE METABOLITE, UR ~~LOC~~: NOT DETECTED
MDMA (Ecstasy)Ur Screen: NOT DETECTED
METHADONE SCREEN, URINE: NOT DETECTED
OPIATE, UR SCREEN: NOT DETECTED
Phencyclidine (PCP) Ur S: NOT DETECTED
TRICYCLIC, UR SCREEN: NOT DETECTED

## 2015-10-14 LAB — TYPE AND SCREEN
ABO/RH(D): A POS
ANTIBODY SCREEN: NEGATIVE

## 2015-10-14 LAB — COMPREHENSIVE METABOLIC PANEL
ALT: 39 U/L (ref 14–54)
ANION GAP: 8 (ref 5–15)
AST: 76 U/L — ABNORMAL HIGH (ref 15–41)
Albumin: 2.7 g/dL — ABNORMAL LOW (ref 3.5–5.0)
Alkaline Phosphatase: 178 U/L — ABNORMAL HIGH (ref 38–126)
BILIRUBIN TOTAL: 0.7 mg/dL (ref 0.3–1.2)
BUN: <5 mg/dL — ABNORMAL LOW (ref 6–20)
CHLORIDE: 109 mmol/L (ref 101–111)
CO2: 27 mmol/L (ref 22–32)
Calcium: 8.5 mg/dL — ABNORMAL LOW (ref 8.9–10.3)
Creatinine, Ser: 0.45 mg/dL (ref 0.44–1.00)
GFR calc Af Amer: >60 mL/min (ref >60)
Glucose, Bld: 88 mg/dL (ref 65–99)
POTASSIUM: 3.7 mmol/L (ref 3.5–5.1)
Sodium: 144 mmol/L (ref 135–145)
TOTAL PROTEIN: 5.6 g/dL — AB (ref 6.5–8.1)

## 2015-10-14 LAB — PREGNANCY, URINE: PREG TEST UR: NEGATIVE

## 2015-10-14 SURGERY — HYSTERECTOMY, TOTAL, LAPAROSCOPIC
Anesthesia: General

## 2015-10-14 MED ORDER — MORPHINE SULFATE 2 MG/ML IV SOLN
INTRAVENOUS | Status: DC
  Administered 2015-10-14: 2 mg via INTRAVENOUS
  Administered 2015-10-14: 16:00:00 via INTRAVENOUS
  Administered 2015-10-15: 3 mg via INTRAVENOUS
  Filled 2015-10-14: qty 25

## 2015-10-14 MED ORDER — FOLIC ACID 1 MG PO TABS
1.0000 mg | ORAL_TABLET | Freq: Every day | ORAL | Status: DC
  Administered 2015-10-15: 1 mg via ORAL
  Filled 2015-10-14 (×2): qty 1

## 2015-10-14 MED ORDER — NALOXONE HCL 0.4 MG/ML IJ SOLN: 0.4000 mg | INTRAMUSCULAR | Status: DC | PRN

## 2015-10-14 MED ORDER — FAMOTIDINE 20 MG PO TABS
20.0000 mg | ORAL_TABLET | Freq: Once | ORAL | Status: AC
  Administered 2015-10-14: 20 mg via ORAL

## 2015-10-14 MED ORDER — DEXTROSE-NACL 5-0.45 % IV SOLN
INTRAVENOUS | Status: DC
  Administered 2015-10-14: 19:00:00 via INTRAVENOUS
  Administered 2015-10-15: 125 mL/h via INTRAVENOUS

## 2015-10-14 MED ORDER — CEFAZOLIN SODIUM-DEXTROSE 2-4 GM/100ML-% IV SOLN
INTRAVENOUS | Status: AC
  Administered 2015-10-14: 2 g via INTRAVENOUS
  Filled 2015-10-14: qty 100

## 2015-10-14 MED ORDER — DIPHENHYDRAMINE HCL 50 MG/ML IJ SOLN: 12.5000 mg | Freq: Four times a day (QID) | INTRAMUSCULAR | Status: DC | PRN

## 2015-10-14 MED ORDER — MENTHOL 3 MG MT LOZG
1.0000 | LOZENGE | OROMUCOSAL | Status: DC | PRN
  Filled 2015-10-14: qty 9

## 2015-10-14 MED ORDER — ADULT MULTIVITAMIN W/MINERALS CH
1.0000 | ORAL_TABLET | Freq: Every day | ORAL | Status: DC
  Administered 2015-10-15: 1 via ORAL
  Filled 2015-10-14 (×2): qty 1

## 2015-10-14 MED ORDER — KETOROLAC TROMETHAMINE 30 MG/ML IJ SOLN: 30.0000 mg | Freq: Four times a day (QID) | INTRAMUSCULAR | Status: DC

## 2015-10-14 MED ORDER — LORAZEPAM 2 MG/ML IJ SOLN
1.0000 mg | Freq: Four times a day (QID) | INTRAMUSCULAR | Status: DC | PRN
  Administered 2015-10-14: 1 mg via INTRAVENOUS
  Filled 2015-10-14: qty 1

## 2015-10-14 MED ORDER — ONDANSETRON HCL 4 MG/2ML IJ SOLN: 4.0000 mg | Freq: Four times a day (QID) | INTRAMUSCULAR | Status: DC | PRN

## 2015-10-14 MED ORDER — HYDROMORPHONE HCL 1 MG/ML IJ SOLN
INTRAMUSCULAR | Status: DC | PRN
  Administered 2015-10-14 (×2): 0.5 mg via INTRAVENOUS

## 2015-10-14 MED ORDER — FENTANYL CITRATE (PF) 100 MCG/2ML IJ SOLN
INTRAMUSCULAR | Status: AC
  Administered 2015-10-14: 25 ug via INTRAVENOUS
  Filled 2015-10-14: qty 2

## 2015-10-14 MED ORDER — ONDANSETRON HCL 4 MG/2ML IJ SOLN
INTRAMUSCULAR | Status: AC
  Filled 2015-10-14: qty 2

## 2015-10-14 MED ORDER — ACETAMINOPHEN 10 MG/ML IV SOLN
INTRAVENOUS | Status: AC
  Filled 2015-10-14: qty 100

## 2015-10-14 MED ORDER — ROCURONIUM BROMIDE 100 MG/10ML IV SOLN
INTRAVENOUS | Status: DC | PRN
  Administered 2015-10-14: 40 mg via INTRAVENOUS
  Administered 2015-10-14: 10 mg via INTRAVENOUS

## 2015-10-14 MED ORDER — FENTANYL CITRATE (PF) 100 MCG/2ML IJ SOLN
25.0000 ug | INTRAMUSCULAR | Status: DC | PRN
  Administered 2015-10-14 (×4): 25 ug via INTRAVENOUS

## 2015-10-14 MED ORDER — THIAMINE HCL 100 MG/ML IJ SOLN
100.0000 mg | Freq: Every day | INTRAMUSCULAR | Status: DC
  Filled 2015-10-14: qty 2

## 2015-10-14 MED ORDER — CEFAZOLIN SODIUM-DEXTROSE 2-4 GM/100ML-% IV SOLN
2.0000 g | Freq: Once | INTRAVENOUS | Status: AC
  Administered 2015-10-14: 2 g via INTRAVENOUS

## 2015-10-14 MED ORDER — PHENYLEPHRINE HCL 10 MG/ML IJ SOLN
INTRAMUSCULAR | Status: DC | PRN
  Administered 2015-10-14 (×5): 100 ug via INTRAVENOUS

## 2015-10-14 MED ORDER — FLUORESCEIN SODIUM 10 % IV SOLN
INTRAVENOUS | Status: DC | PRN
  Administered 2015-10-14: .5 mL via INTRAVENOUS

## 2015-10-14 MED ORDER — DEXAMETHASONE SODIUM PHOSPHATE 10 MG/ML IJ SOLN
INTRAMUSCULAR | Status: DC | PRN
  Administered 2015-10-14: 10 mg via INTRAVENOUS

## 2015-10-14 MED ORDER — ONDANSETRON HCL 4 MG/2ML IJ SOLN
4.0000 mg | Freq: Once | INTRAMUSCULAR | Status: AC | PRN
  Administered 2015-10-14: 4 mg via INTRAVENOUS

## 2015-10-14 MED ORDER — FLUORESCEIN SODIUM 10 % IV SOLN
INTRAVENOUS | Status: AC
  Filled 2015-10-14: qty 5

## 2015-10-14 MED ORDER — LACTATED RINGERS IV SOLN
INTRAVENOUS | Status: DC
  Administered 2015-10-14: 11:00:00 via INTRAVENOUS

## 2015-10-14 MED ORDER — KETOROLAC TROMETHAMINE 30 MG/ML IJ SOLN
30.0000 mg | Freq: Four times a day (QID) | INTRAMUSCULAR | Status: DC
  Administered 2015-10-14: 30 mg via INTRAVENOUS
  Filled 2015-10-14: qty 1

## 2015-10-14 MED ORDER — PROPOFOL 10 MG/ML IV BOLUS
INTRAVENOUS | Status: DC | PRN
  Administered 2015-10-14: 200 mg via INTRAVENOUS

## 2015-10-14 MED ORDER — DIPHENHYDRAMINE HCL 12.5 MG/5ML PO ELIX
12.5000 mg | ORAL_SOLUTION | Freq: Four times a day (QID) | ORAL | Status: DC | PRN
  Filled 2015-10-14: qty 5

## 2015-10-14 MED ORDER — ONDANSETRON HCL 4 MG/2ML IJ SOLN
4.0000 mg | Freq: Four times a day (QID) | INTRAMUSCULAR | Status: DC | PRN
Start: 2015-10-14 — End: 2015-10-15
  Administered 2015-10-14 – 2015-10-15 (×2): 4 mg via INTRAVENOUS
  Filled 2015-10-14 (×2): qty 2

## 2015-10-14 MED ORDER — SODIUM CHLORIDE 0.9% FLUSH: 9.0000 mL | INTRAVENOUS | Status: DC | PRN

## 2015-10-14 MED ORDER — ONDANSETRON HCL 4 MG/2ML IJ SOLN
INTRAMUSCULAR | Status: DC | PRN
  Administered 2015-10-14: 4 mg via INTRAVENOUS

## 2015-10-14 MED ORDER — NALOXONE HCL 0.4 MG/ML IJ SOLN
0.4000 mg | INTRAMUSCULAR | Status: DC | PRN
Start: 2015-10-14 — End: 2015-10-15

## 2015-10-14 MED ORDER — LIDOCAINE HCL (CARDIAC) 20 MG/ML IV SOLN
INTRAVENOUS | Status: DC | PRN
  Administered 2015-10-14: 30 mg via INTRAVENOUS

## 2015-10-14 MED ORDER — ACETAMINOPHEN 10 MG/ML IV SOLN
INTRAVENOUS | Status: DC | PRN
  Administered 2015-10-14: 1000 mg via INTRAVENOUS

## 2015-10-14 MED ORDER — BUPIVACAINE HCL 0.5 % IJ SOLN
INTRAMUSCULAR | Status: DC | PRN
  Administered 2015-10-14: 10 mL

## 2015-10-14 MED ORDER — SUGAMMADEX SODIUM 200 MG/2ML IV SOLN
INTRAVENOUS | Status: DC | PRN
  Administered 2015-10-14: 115 mg via INTRAVENOUS

## 2015-10-14 MED ORDER — KETOROLAC TROMETHAMINE 30 MG/ML IJ SOLN
INTRAMUSCULAR | Status: DC | PRN
  Administered 2015-10-14: 15 mg via INTRAVENOUS

## 2015-10-14 MED ORDER — FENTANYL CITRATE (PF) 100 MCG/2ML IJ SOLN
INTRAMUSCULAR | Status: DC | PRN
  Administered 2015-10-14 (×2): 50 ug via INTRAVENOUS

## 2015-10-14 MED ORDER — HYDROMORPHONE HCL 1 MG/ML IJ SOLN
INTRAMUSCULAR | Status: AC
  Filled 2015-10-14: qty 1

## 2015-10-14 MED ORDER — LORAZEPAM 1 MG PO TABS: 1.0000 mg | ORAL_TABLET | Freq: Four times a day (QID) | ORAL | Status: DC | PRN

## 2015-10-14 MED ORDER — FAMOTIDINE 20 MG PO TABS
ORAL_TABLET | ORAL | Status: AC
  Administered 2015-10-14: 20 mg via ORAL
  Filled 2015-10-14: qty 1

## 2015-10-14 MED ORDER — VITAMIN B-1 100 MG PO TABS
100.0000 mg | ORAL_TABLET | Freq: Every day | ORAL | Status: DC
  Administered 2015-10-15: 100 mg via ORAL
  Filled 2015-10-14 (×2): qty 1

## 2015-10-14 MED ORDER — ONDANSETRON HCL 4 MG PO TABS
4.0000 mg | ORAL_TABLET | Freq: Four times a day (QID) | ORAL | Status: DC | PRN
  Administered 2015-10-15: 4 mg via ORAL
  Filled 2015-10-14: qty 1

## 2015-10-14 MED ORDER — BUPIVACAINE HCL (PF) 0.5 % IJ SOLN
INTRAMUSCULAR | Status: AC
  Filled 2015-10-14: qty 30

## 2015-10-14 MED ORDER — MIDAZOLAM HCL 2 MG/2ML IJ SOLN
INTRAMUSCULAR | Status: DC | PRN
  Administered 2015-10-14: 2 mg via INTRAVENOUS

## 2015-10-14 MED ORDER — HYDROMORPHONE HCL 1 MG/ML IJ SOLN
0.5000 mg | INTRAMUSCULAR | Status: DC | PRN
  Administered 2015-10-14 (×2): 0.5 mg via INTRAVENOUS

## 2015-10-14 MED ORDER — HYDROMORPHONE HCL 1 MG/ML IJ SOLN
INTRAMUSCULAR | Status: AC
  Administered 2015-10-14: 0.5 mg via INTRAVENOUS
  Filled 2015-10-14: qty 1

## 2015-10-14 SURGICAL SUPPLY — 59 items
BAG URO DRAIN 2000ML W/SPOUT (MISCELLANEOUS) ×6 IMPLANT
BLADE SURG SZ11 CARB STEEL (BLADE) ×6 IMPLANT
CANISTER SUCT 1200ML W/VALVE (MISCELLANEOUS) ×6 IMPLANT
CATH FOLEY 2WAY  5CC 16FR (CATHETERS) ×2
CATH FOLEY 2WAY 5CC 16FR (CATHETERS) ×4
CATH ROBINSON RED A/P 16FR (CATHETERS) ×2 IMPLANT
CATH URTH 16FR FL 2W BLN LF (CATHETERS) ×4 IMPLANT
CHLORAPREP W/TINT 26ML (MISCELLANEOUS) ×6 IMPLANT
DEVICE SUTURE ENDOST 10MM (ENDOMECHANICALS) ×4 IMPLANT
DRAPE UNDER BUTTOCK W/FLU (DRAPES) ×6 IMPLANT
DRESSING SURGICEL FIBRLLR 1X2 (HEMOSTASIS) ×2 IMPLANT
DRSG SURGICEL FIBRILLAR 1X2 (HEMOSTASIS) ×6
FILTER LAP SMOKE EVAC STRL (MISCELLANEOUS) ×2 IMPLANT
GLOVE BIO SURGEON STRL SZ7 (GLOVE) ×24 IMPLANT
GLOVE INDICATOR 7.5 STRL GRN (GLOVE) ×14 IMPLANT
GOWN STRL REUS W/ TWL LRG LVL3 (GOWN DISPOSABLE) ×10 IMPLANT
GOWN STRL REUS W/ TWL XL LVL3 (GOWN DISPOSABLE) ×2 IMPLANT
GOWN STRL REUS W/TWL LRG LVL3 (GOWN DISPOSABLE) ×18
GOWN STRL REUS W/TWL XL LVL3 (GOWN DISPOSABLE)
IRRIGATION STRYKERFLOW (MISCELLANEOUS) ×4 IMPLANT
IRRIGATOR STRYKERFLOW (MISCELLANEOUS) ×6
IV LACTATED RINGERS 1000ML (IV SOLUTION) ×6 IMPLANT
IV NS 1000ML (IV SOLUTION) ×6
IV NS 1000ML BAXH (IV SOLUTION) ×4 IMPLANT
KIT PINK PAD W/HEAD ARE REST (MISCELLANEOUS) ×6
KIT PINK PAD W/HEAD ARM REST (MISCELLANEOUS) ×4 IMPLANT
LABEL OR SOLS (LABEL) ×6 IMPLANT
LIQUID BAND (GAUZE/BANDAGES/DRESSINGS) ×6 IMPLANT
MANIPULATOR VCARE LG CRV RETR (MISCELLANEOUS) IMPLANT
MANIPULATOR VCARE SML CRV RETR (MISCELLANEOUS) ×4 IMPLANT
MANIPULATOR VCARE STD CRV RETR (MISCELLANEOUS) IMPLANT
NS IRRIG 1000ML POUR BTL (IV SOLUTION) ×6 IMPLANT
NS IRRIG 500ML POUR BTL (IV SOLUTION) ×6 IMPLANT
OCCLUDER COLPOPNEUMO (BALLOONS) IMPLANT
PACK GYN LAPAROSCOPIC (MISCELLANEOUS) ×6 IMPLANT
PAD OB MATERNITY 4.3X12.25 (PERSONAL CARE ITEMS) ×6 IMPLANT
PAD PREP 24X41 OB/GYN DISP (PERSONAL CARE ITEMS) ×6 IMPLANT
SCISSORS METZENBAUM CVD 33 (INSTRUMENTS) ×4 IMPLANT
SET CYSTO W/LG BORE CLAMP LF (SET/KITS/TRAYS/PACK) ×6 IMPLANT
SHEARS HARMONIC ACE PLUS 36CM (ENDOMECHANICALS) ×6 IMPLANT
SLEEVE ENDOPATH XCEL 5M (ENDOMECHANICALS) ×12 IMPLANT
SPONGE LAP 18X18 5 PK (GAUZE/BANDAGES/DRESSINGS) ×2 IMPLANT
SPONGE XRAY 4X4 16PLY STRL (MISCELLANEOUS) ×6 IMPLANT
STRAP SAFETY BODY (MISCELLANEOUS) ×2 IMPLANT
SURGILUBE 2OZ TUBE FLIPTOP (MISCELLANEOUS) ×6 IMPLANT
SUT ENDO VLOC 180-0-8IN (SUTURE) ×8 IMPLANT
SUT MNCRL 4-0 (SUTURE) ×6
SUT MNCRL 4-0 27XMFL (SUTURE) ×4
SUT VIC AB 0 CT1 27 (SUTURE) ×6
SUT VIC AB 0 CT1 27XCR 8 STRN (SUTURE) ×4 IMPLANT
SUT VIC AB 0 UR5 27 (SUTURE) ×4 IMPLANT
SUT VIC AB 2-0 UR6 27 (SUTURE) ×2 IMPLANT
SUT VIC AB 4-0 FS2 27 (SUTURE) ×4 IMPLANT
SUTURE MNCRL 4-0 27XMF (SUTURE) ×2 IMPLANT
SYR 50ML LL SCALE MARK (SYRINGE) ×6 IMPLANT
SYRINGE 10CC LL (SYRINGE) ×6 IMPLANT
TROCAR ENDO BLADELESS 11MM (ENDOMECHANICALS) ×4 IMPLANT
TROCAR XCEL NON-BLD 5MMX100MML (ENDOMECHANICALS) ×6 IMPLANT
TUBING INSUFFLATOR HEATED (MISCELLANEOUS) ×6 IMPLANT

## 2015-10-14 NOTE — Transfer of Care (Signed)
Immediate Anesthesia Transfer of Care Note  Patient: ARIZONA CORNIER  Procedure(s) Performed: Procedure(s): HYSTERECTOMY TOTAL LAPAROSCOPIC (N/A) CYSTOSCOPY (N/A) LAPAROSCOPIC BILATERAL SALPINGO OOPHORECTOMY  Patient Location: PACU  Anesthesia Type:General  Level of Consciousness: sedated  Airway & Oxygen Therapy: Patient Spontanous Breathing and Patient connected to face mask oxygen  Post-op Assessment: Report given to RN and Post -op Vital signs reviewed and stable  Post vital signs: Reviewed and stable  Last Vitals:  Vitals:   10/14/15 0950  BP: 95/69  Pulse: 85  Resp: 18  Temp: (!) 35.7 C    Last Pain:  Vitals:   10/14/15 0950  TempSrc: Tympanic  PainSc: 6          Complications: No apparent anesthesia complications

## 2015-10-14 NOTE — Anesthesia Preprocedure Evaluation (Signed)
Anesthesia Evaluation  Patient identified by MRN, date of birth, ID band Patient awake    Reviewed: Allergy & Precautions, NPO status , Patient's Chart, lab work & pertinent test results  History of Anesthesia Complications Negative for: history of anesthetic complications  Airway Mallampati: II       Dental  (+) Edentulous Upper, Edentulous Lower   Pulmonary Current Smoker,           Cardiovascular negative cardio ROS       Neuro/Psych Anxiety Bipolar Disorder    GI/Hepatic negative GI ROS, Neg liver ROS,   Endo/Other  negative endocrine ROS  Renal/GU negative Renal ROS     Musculoskeletal  (+) Arthritis , Osteoarthritis,    Abdominal   Peds  Hematology  (+) anemia ,   Anesthesia Other Findings   Reproductive/Obstetrics                             Anesthesia Physical Anesthesia Plan  ASA: II  Anesthesia Plan: General   Post-op Pain Management:    Induction: Intravenous  Airway Management Planned: Oral ETT  Additional Equipment:   Intra-op Plan:   Post-operative Plan:   Informed Consent: I have reviewed the patients History and Physical, chart, labs and discussed the procedure including the risks, benefits and alternatives for the proposed anesthesia with the patient or authorized representative who has indicated his/her understanding and acceptance.     Plan Discussed with:   Anesthesia Plan Comments:         Anesthesia Quick Evaluation

## 2015-10-14 NOTE — Op Note (Signed)
Preoperative Diagnosis: 1) 55 y.o. pelvic pain 2) Simple right ovarian cyst  3) Endometrial polyp  Postoperative Diagnosis: 1) 55 y.o. pelvic pain 2) Simple right ovarian cyst  3) Endometrial polyp  Operation Performed: Laparoscopic total hysterectomy, bilateral salpingo-oophorectomy, and cystoscopy  Indication: 55 y.o. with acute onset right lower quadrant pain, evaluation revealing a right ovarian cyst, endometrial polyp.  Seen by Gynecology-Oncology who favored benign process.  CA-125 not elevated.  Surgeon: Malachy Mood, MD  Assistant: Prentice Docker, MD  Anesthesia: General  Preoperative Antibiotics: 2g Ancef  Estimated Blood Loss: 78mL  IV Fluids: 1533mL  Drains or Tubes: Foley to gravity drainage  Implants: none  Specimens Removed: none  Complications: none  Intraoperative Findings: Normal tubes, left ovary, and uterus.  Simple appearing right ovarian cyst 4cm.  Bilateral efflux of urine from both ureters on cystoscopy with intact bladder dome.  Patient Condition: stable  Procedure in Detail:  Patient was taken to the operating room where she was administered general anesthesia.  She was positioned in the dorsal lithotomy position utilizing Allen stirups, prepped and draped in the usual sterile fashion.  Prior to proceeding with procedure a time out was performed.  Attention was turned to the patient's pelvis.  A indwelling foley catheter was used to empty the patient's bladder.  An operative speculum was placed to allow visualization of the cervix.  The anterior lip of the cervix was grasped with a small V-care device was placed to allow manipulation of the uterus.  The operative speculum and single tooth tenaculum were then removed.  Attention was turned to the patient's abdomen.  The umbilicus was infiltrated with 1% Sensorcaine, before making a stab incision using an 11 blade scalpel.  A 62mm Excel trocar was then used to gain direct entry into the peritoneal  cavity utilizing the camera to visualize progress of the trocar during placement.  Once peritoneal entry had been achieved, insufflation was started and pneumoperitoneum established at a pressure of 109mmHg.   A left and right lower quadrant 59mm assistant ports were then placed under direct visualiztion. General inspection of the abdomen revealed the above noted findings.   Attention was turned to the patient left adnexal structures.  The right ureter was identified and the coursing well below the IP ligament.  The right IP ligament was grasped and transected using the 77mm harmonic scalpel.  The attachments of the ovary and the tube the broad ligament were transected, followed by transection of the round ligament.  The anterior leaf of the broad ligament was taken down to the level of the internal cervical os and a bladder flap was created.  The posterior leaf of the broad was likewise skeletonized down to the the left uterosacral ligament.  The uterine artery was further skeletonized then transected at the level of the internal cervical os using the harmonic scalpel.  The structures on the right were then transected from their attachments in a similar fashion also clearly visualizing the ureter below the site of dissection.  Once the right uterine artery was transected the bladder flap was inspected noted to be well off the V-care cup.  Anterior colpotomy was made using the harmonic scalpel and carried around in a counter clockwise fashion.  The specimen was removed vaginally.  Given patient narrow introitus the cuff was closed using the endo-stitch and a V-Lock suture.  To accomplish this the right lower quadrant port was stepped up to a 53mm excel trocar.  The bimanual exam showed no defects in  the vaginal cuff.  Fibrilar was applied to the vaginal cuff.  All pedicles were noted to be hemostatic.  The 78mm port site was closed using a 0 Vicryl and carter thompson.  This was tied down under direct visualization  ensuring no bowl was trapped in the stitch. Pneumoperitoneum was evacuated.  The trocars were removed.  The 28mm trocar site was closed with 4-0 Monocryl in a subcuticular fashion.  All trocar sites were then dressed with surgical skin glue.  Cystoscopy was performed using a 70 degree cystoscope with bilateral efflux of urine from both UO's and an intact bladder dome.   Sponge needle and instrument counts were correct time two.  The patient tolerated the procedure well and was taken to the recovery room in stable condition.

## 2015-10-14 NOTE — H&P (Signed)
Date of Initial H&P: 10/02/2015  History reviewed, patient examined, no change in status, stable for surgery.

## 2015-10-14 NOTE — Anesthesia Postprocedure Evaluation (Signed)
Anesthesia Post Note  Patient: Claire Brown  Procedure(s) Performed: Procedure(s) (LRB): HYSTERECTOMY TOTAL LAPAROSCOPIC (N/A) CYSTOSCOPY (N/A) [place] OOPHORECTOMY  Patient location during evaluation: PACU Anesthesia Type: General Level of consciousness: awake and alert Pain management: pain level controlled Vital Signs Assessment: post-procedure vital signs reviewed and stable Respiratory status: spontaneous breathing and respiratory function stable Cardiovascular status: stable Anesthetic complications: no    Last Vitals:  Vitals:   10/14/15 0950 10/14/15 1333  BP: 95/69 103/77  Pulse: 85 91  Resp: 18 19  Temp: (!) 35.7 C 36.3 C    Last Pain:  Vitals:   10/14/15 1333  TempSrc:   PainSc: 7                  KEPHART,WILLIAM K

## 2015-10-14 NOTE — Anesthesia Procedure Notes (Signed)
Procedure Name: Intubation Date/Time: 10/14/2015 11:27 AM Performed by: Johnna Acosta Pre-anesthesia Checklist: Emergency Drugs available, Patient identified, Suction available, Patient being monitored and Timeout performed Patient Re-evaluated:Patient Re-evaluated prior to inductionOxygen Delivery Method: Circle system utilized Preoxygenation: Pre-oxygenation with 100% oxygen Intubation Type: IV induction Ventilation: Mask ventilation without difficulty Laryngoscope Size: Miller and 2 Grade View: Grade I Tube type: Oral Tube size: 7.0 mm Number of attempts: 1 Airway Equipment and Method: Stylet Placement Confirmation: ETT inserted through vocal cords under direct vision,  positive ETCO2 and breath sounds checked- equal and bilateral Secured at: 20 cm Tube secured with: Tape Dental Injury: Teeth and Oropharynx as per pre-operative assessment

## 2015-10-15 ENCOUNTER — Encounter: Payer: Self-pay | Admitting: Obstetrics and Gynecology

## 2015-10-15 LAB — BASIC METABOLIC PANEL
ANION GAP: 6 (ref 5–15)
BUN: <5 mg/dL — ABNORMAL LOW (ref 6–20)
CO2: 27 mmol/L (ref 22–32)
Calcium: 8.1 mg/dL — ABNORMAL LOW (ref 8.9–10.3)
Chloride: 108 mmol/L (ref 101–111)
Creatinine, Ser: 0.53 mg/dL (ref 0.44–1.00)
GFR calc Af Amer: >60 mL/min (ref >60)
GFR calc non Af Amer: >60 mL/min (ref >60)
GLUCOSE: 183 mg/dL — AB (ref 65–99)
POTASSIUM: 4.1 mmol/L (ref 3.5–5.1)
Sodium: 141 mmol/L (ref 135–145)

## 2015-10-15 LAB — CBC
HEMATOCRIT: 36 % (ref 35.0–47.0)
Hemoglobin: 12.5 g/dL (ref 12.0–16.0)
MCH: 39.1 pg — AB (ref 26.0–34.0)
MCHC: 34.8 g/dL (ref 32.0–36.0)
MCV: 112.1 fL — AB (ref 80.0–100.0)
Platelets: 318 10*3/uL (ref 150–440)
RBC: 3.21 MIL/uL — ABNORMAL LOW (ref 3.80–5.20)
RDW: 18.2 % — AB (ref 11.5–14.5)
WBC: 14 10*3/uL — AB (ref 3.6–11.0)

## 2015-10-15 LAB — SURGICAL PATHOLOGY

## 2015-10-15 MED ORDER — HYDROMORPHONE HCL 2 MG PO TABS
2.0000 mg | ORAL_TABLET | ORAL | Status: DC | PRN
Start: 2015-10-15 — End: 2015-10-15
  Administered 2015-10-15 (×2): 2 mg via ORAL
  Filled 2015-10-15 (×2): qty 1

## 2015-10-15 MED ORDER — IBUPROFEN 600 MG PO TABS
600.0000 mg | ORAL_TABLET | Freq: Four times a day (QID) | ORAL | Status: DC | PRN
  Administered 2015-10-15: 600 mg via ORAL
  Filled 2015-10-15: qty 1

## 2015-10-15 MED ORDER — IBUPROFEN 600 MG PO TABS: 600.0000 mg | ORAL_TABLET | Freq: Four times a day (QID) | ORAL | 0 refills | Status: DC | PRN

## 2015-10-15 MED ORDER — HYDROMORPHONE HCL 2 MG PO TABS: 2.0000 mg | ORAL_TABLET | ORAL | 0 refills | Status: DC | PRN

## 2015-10-15 NOTE — Discharge Summary (Signed)
Physician Discharge Summary  Patient ID: Claire Brown MRN: [id number] DOB/AGE: 08/06/1960 55 y.o.  Admit date: 10/14/2015 Discharge date: 10/15/2015  Admission Diagnoses: Scheduled hysterectomy  Discharge Diagnoses:  Active Problems:   S/P laparoscopic hysterectomy   Discharged Condition: good  Hospital Course: Uncomplicated TLH, BSO, cystoscopy.  Patient did develop some symptoms that may be attributable to EtOH withdrawal and was CIWA scored postoperatively.  Patient remained afebrile, hemodynamically stable throughtout her admission.  Postoperative labs showed stable BUN/Cr and appropriate H&H.  Consults: None  Significant Diagnostic Studies:  Results for orders placed or performed during the hospital encounter of 10/14/15 (from the past 24 hour(s))  Urine Drug Screen, Qualitative ([facility] only)     Status: None   Collection Time: 10/14/15  9:31 AM  Result Value Ref Range   Tricyclic, Ur Screen NONE DETECTED NONE DETECTED   Amphetamines, Ur Screen NONE DETECTED NONE DETECTED   MDMA (Ecstasy)Ur Screen NONE DETECTED NONE DETECTED   Cocaine Metabolite,Ur [place] NONE DETECTED NONE DETECTED   Opiate, Ur Screen NONE DETECTED NONE DETECTED   Phencyclidine (PCP) Ur S NONE DETECTED NONE DETECTED   Cannabinoid 50 Ng, Ur [place] NONE DETECTED NONE DETECTED   Barbiturates, Ur Screen NONE DETECTED NONE DETECTED   Benzodiazepine, Ur Scrn NONE DETECTED NONE DETECTED   Methadone Scn, Ur NONE DETECTED NONE DETECTED  Comprehensive metabolic panel     Status: Abnormal   Collection Time: 10/14/15  9:40 AM  Result Value Ref Range   Sodium 144 135 - 145 mmol/L   Potassium 3.7 3.5 - 5.1 mmol/L   Chloride 109 101 - 111 mmol/L   CO2 27 22 - 32 mmol/L   Glucose, Bld 88 65 - 99 mg/dL   BUN <5 (L) 6 - 20 mg/dL   Creatinine, Ser 0.45 0.44 - 1.00 mg/dL   Calcium 8.5 (L) 8.9 - 10.3 mg/dL   Total Protein 5.6 (L) 6.5 - 8.1 g/dL   Albumin 2.7 (L) 3.5 - 5.0 g/dL   AST 76 (H) 15 - 41 U/L   ALT 39 14 - 54 U/L    Alkaline Phosphatase 178 (H) 38 - 126 U/L   Total Bilirubin 0.7 0.3 - 1.2 mg/dL   GFR calc non Af Amer >60 >60 mL/min   GFR calc Af Amer >60 >60 mL/min   Anion gap 8 5 - 15  Type and screen [facility]     Status: None   Collection Time: 10/14/15  9:40 AM  Result Value Ref Range   ABO/RH(D) A POS    Antibody Screen NEG    Sample Expiration 10/17/2015   Pregnancy, urine     Status: None   Collection Time: 10/14/15  9:51 AM  Result Value Ref Range   Preg Test, Ur NEGATIVE NEGATIVE  CBC     Status: Abnormal   Collection Time: 10/15/15  4:44 AM  Result Value Ref Range   WBC 14.0 (H) 3.6 - 11.0 K/uL   RBC 3.21 (L) 3.80 - 5.20 MIL/uL   Hemoglobin 12.5 12.0 - 16.0 g/dL   HCT 36.0 35.0 - 47.0 %   MCV 112.1 (H) 80.0 - 100.0 fL   MCH 39.1 (H) 26.0 - 34.0 pg   MCHC 34.8 32.0 - 36.0 g/dL   RDW 18.2 (H) 11.5 - 14.5 %   Platelets 318 150 - 440 K/uL  Basic metabolic panel     Status: Abnormal   Collection Time: 10/15/15  4:44 AM  Result Value Ref Range  Sodium 141 135 - 145 mmol/L   Potassium 4.1 3.5 - 5.1 mmol/L   Chloride 108 101 - 111 mmol/L   CO2 27 22 - 32 mmol/L   Glucose, Bld 183 (H) 65 - 99 mg/dL   BUN <5 (L) 6 - 20 mg/dL   Creatinine, Ser 0.53 0.44 - 1.00 mg/dL   Calcium 8.1 (L) 8.9 - 10.3 mg/dL   GFR calc non Af Amer >60 >60 mL/min   GFR calc Af Amer >60 >60 mL/min   Anion gap 6 5 - 15    Treatments: TLH, BSO, cystoscopy  Discharge Exam: Blood pressure 107/77, pulse 78, temperature 98.5 F (36.9 C), temperature source Oral, resp. rate 18, SpO2 97 %. General appearance: alert and appears older than stated age Resp: clear to auscultation bilaterally Cardio: regular rate and rhythm, S1, S2 normal, no murmur, click, rub or gallop GI: soft, non-tender; bowel sounds normal; no masses,  no organomegaly and incision D/C/I Extremities: extremities normal, atraumatic, no cyanosis or edema  Disposition: 01-Home or Self Care  Discharge Instructions     Activity as tolerated    Complete by:  As directed   Call MD for:  difficulty breathing, headache or visual disturbances    Complete by:  As directed   Call MD for:  extreme fatigue    Complete by:  As directed   Call MD for:  hives    Complete by:  As directed   Call MD for:  persistant dizziness or light-headedness    Complete by:  As directed   Call MD for:  persistant nausea and vomiting    Complete by:  As directed   Call MD for:  redness, tenderness, or signs of infection (pain, swelling, redness, odor or green/yellow discharge around incision site)    Complete by:  As directed   Call MD for:  severe uncontrolled pain    Complete by:  As directed   Call MD for:  temperature >100.4    Complete by:  As directed   Diet - low sodium heart healthy    Complete by:  As directed   Driving restriction     Complete by:  As directed   Avoid driving for at least 2 weeks or while taking prescription pain medication.   Lifting restrictions    Complete by:  As directed   Weight restriction of 10 lbs.   Sexual acrtivity    Complete by:  As directed   No intercourse for 6 weeks       Medication List    STOP taking these medications   etodolac 200 MG capsule Commonly known as:  LODINE   HYDROcodone-acetaminophen 5-325 MG tablet Commonly known as:  NORCO/VICODIN   meclizine 25 MG tablet Commonly known as:  ANTIVERT   traMADol 50 MG tablet Commonly known as:  ULTRAM     TAKE these medications   diphenhydrAMINE 25 MG tablet Commonly known as:  BENADRYL Take 25 mg by mouth every 6 (six) hours as needed for allergies.   HYDROmorphone 2 MG tablet Commonly known as:  DILAUDID Take 1 tablet (2 mg total) by mouth every 3 (three) hours as needed for moderate pain or severe pain.   ibuprofen 600 MG tablet Commonly known as:  ADVIL,MOTRIN Take 1 tablet (600 mg total) by mouth every 6 (six) hours as needed for fever or headache.   multivitamin with minerals tablet Take 1 tablet by mouth  daily.   ondansetron 4 MG tablet Commonly known  as:  ZOFRAN Take 1-2 tabs by mouth every 8 hours as needed for nausea/vomiting        Signed: Dorthula Nettles 10/15/2015, 7:58 AM

## 2015-10-15 NOTE — Plan of Care (Signed)
Problem: Education: Goal: Knowledge of disease or condition will improve Outcome: Progressing Acknowledged to Dr Georgianne Fick that she does drink and a hx of drug abuse.

## 2015-10-15 NOTE — Discharge Instructions (Signed)
Laparoscopically Assisted Vaginal Hysterectomy A laparoscopically assisted vaginal hysterectomy (LAVH) is a surgical procedure to remove the uterus and cervix, and sometimes the ovaries and fallopian tubes. During an LAVH, some of the surgical removal is done through the vagina, and the rest is done through a few small surgical cuts (incisions) in the abdomen.  This procedure is usually considered in women when a vaginal hysterectomy is not an option. Your health care provider will discuss the risks and benefits of the different surgical techniques at your appointment. Generally, recovery time is faster and there are fewer complications after laparoscopic procedures than after open incisional procedures. LET [facility] CARE PROVIDER KNOW ABOUT:   Any allergies you have.  All medicines you are taking, including vitamins, herbs, eye drops, creams, and over-the-counter medicines.  Previous problems you or members of your family have had with the use of anesthetics.  Any blood disorders you have.  Previous surgeries you have had.  Medical conditions you have. RISKS AND COMPLICATIONS Generally, this is a safe procedure. However, as with any procedure, complications can occur. Possible complications include:  Allergies to medicines.  Difficulty breathing.  Bleeding.  Infection.  Damage to other structures near your uterus and cervix. BEFORE THE PROCEDURE  Ask your health care provider about changing or stopping your regular medicines.  Take certain medicines, such as a colon-emptying preparation, as directed.  Do not eat or drink anything for at least 8 hours before your surgery.  Stop smoking if you smoke. Stopping will improve your health after surgery.  Arrange for a ride home after surgery and for help at home during recovery. PROCEDURE   An IV tube will be put into one of your veins in order to give you fluids and medicines.  You will receive medicines to relax you and  medicines that make you sleep (general anesthetic).  You may have a flexible tube (catheter) put into your bladder to drain urine.  You may have a tube put through your nose or mouth that goes into your stomach (nasogastric tube). The nasogastric tube removes digestive fluids and prevents you from feeling nauseated and from vomiting.  Tight-fitting (compression) stockings will be placed on your legs to promote circulation.  Three to four small incisions will be made in your abdomen. An incision also will be made in your vagina. Probes and tools will be inserted into the small incisions. The uterus and cervix are removed (and possibly your ovaries and fallopian tubes) through your vagina as well as through the small incisions that were made in the abdomen.  Your vagina is then sewn back to normal. AFTER THE PROCEDURE  You may have a liquid diet temporarily. You will most likely return to, and tolerate, your usual diet the day after surgery.  You will be passing urine through a catheter. It will be removed the day after surgery.  Your temperature, breathing rate, heart rate, blood pressure, and oxygen level will be monitored regularly.  You will still wear compression stockings on your legs until you are able to move around.  You will use a special device or do breathing exercises to keep your lungs clear.  You will be encouraged to walk as soon as possible.   This information is not intended to replace advice given to you by your health care provider. Make sure you discuss any questions you have with your health care provider.   Document Released: 01/21/2011 Document Revised: 02/22/2014 Document Reviewed: 08/17/2012 Elsevier Interactive Patient Education 2016 Elsevier  Inc.    Hydromorphone extended release tablets What is this medicine? HYDROMORPHONE (hye droe MOR fone) is a pain reliever. It is used to treat moderate to severe pain. It is used by people who have been taking an  opioid or narcotic pain medicine. This medicine may be used for other purposes; ask your health care provider or pharmacist if you have questions. What should I tell my health care provider before I take this medicine? They need to know if you have any of these conditions: -drug abuse or addiction -head injury -heart disease -if you frequently drink alcohol containing drinks -kidney disease or problems urinating -liver disease -lung or breathing disease, like asthma -seizures -stomach problems -taken an MAOI like Carbex, Eldepryl, Marplan, Nardil, or Parnate in last 14 days -an unusual or allergic reaction to hydromorphone, other medicines, foods, dyes, or preservatives -pregnant or trying to get pregnant -breast-feeding How should I use this medicine? Take this medicine by mouth with a glass of water. Do not break, crush, or chew the tablets. Do not take a tablet that is not whole. A broken or crushed tablet can be very dangerous. You may get too much medicine. Also do not take this medicine with alcohol or any drugs containing alcohol. If the medicine upsets your stomach, take it with food or milk. Follow the directions on the prescription label. Take the medicine at the same time each day. Do not take more than you are told to take. A special MedGuide will be given to you by the pharmacist with each prescription and refill. Be sure to read this information carefully each time. Talk to your pediatrician regarding the use of this medicine in children. Special care may be needed. Overdosage: If you think you have taken too much of this medicine contact a poison control center or emergency room at once. NOTE: This medicine is only for you. Do not share this medicine with others. What if I miss a dose? If you miss a dose, take it as soon as you can. If it is almost time for your next dose, take only that dose. Do not take double or extra doses. What may interact with this medicine? Do not  take this medicine with any of the following medications: -alcohol or any product that contains alcohol -MAOIs like Carbex, Eldepryl, Marplan, Nardil, and Parnate This medicine may also interact with the following medications: -antihistamines for allergy, cough and cold -barbiturates, like phenobarbital -medicines for anesthesia -medicines for depression, anxiety, or psychotic disturbancesmedicines for sleep -muscle relaxants -naltrexone, naloxone -narcotic medicines (opiates) for pain -phenothiazines like chlorpromazine, mesoridazine, prochlorperazine, thioridazine -tramadol This list may not describe all possible interactions. Give your health care provider a list of all the medicines, herbs, non-prescription drugs, or dietary supplements you use. Also tell them if you smoke, drink alcohol, or use illegal drugs. Some items may interact with your medicine. What should I watch for while using this medicine? Tell your doctor or health care professional if your pain does not go away, if it gets worse, or if you have new or a different type of pain. You may develop tolerance to the medicine. Tolerance means that you will need a higher dose of the medicine for pain relief. Tolerance is normal and is expected if you take this medicine for a long time. Do not suddenly stop taking your medicine because you may develop a severe reaction. Your body becomes used to the medicine. This does NOT mean you are addicted. Addiction is a  behavior related to getting and using a drug for a non-medical reason. If you have pain, you have a medical reason to take pain medicine. Your doctor will tell you how much medicine to take. If your doctor wants you to stop the medicine, the dose will be slowly lowered over time to avoid any side effects. Do not drink, eat, or take anything that may contain alcohol. Alcohol makes this medicine work incorrectly. You may have very dangerous side effects if you take the medicine with  any alcohol. You may get drowsy or dizzy. Do not drive, use machinery, or do anything that needs mental alertness until you know how this medicine affects you. Do not stand or sit up quickly, especially if you are an older patient. This reduces the risk of dizzy or fainting spells. Alcohol may interfere with the effect of this medicine. Avoid alcoholic drinks. There are different types of narcotic medicines (opiates) for pain. If you take more than one type at the same time, you may have more side effects. Give your health care provider a list of all medicines you use. Your doctor will tell you how much medicine to take. Do not take more medicine than directed. Call emergency for help if you have problems breathing. This medicine will cause constipation. Try to have a bowel movement at least every 2 to 3 days. If you do not have a bowel movement for 3 days, call your doctor or health care professional. Your mouth may get dry. Chewing sugarless gum or sucking hard candy, and drinking plenty of water may help. Contact your doctor if the problem does not go away or is severe. What side effects may I notice from receiving this medicine? Side effects that you should report to your doctor or health care professional as soon as possible: -allergic reactions like skin rash, itching or hives, swelling of the face, lips, or tongue -breathing problems -changes in vision -confusion -feeling faint or lightheaded, falls -seizures -slow or fast heartbeat -trouble passing urine or change in the amount of urine -trouble with balance, talking, or walking -unusually weak or tired Side effects that usually do not require medical attention (Report these to your doctor or health care professional if they continue or are bothersome.): -difficulty sleeping -drowsiness -dry mouth -flushing -headache -itching -loss of appetite -nausea, vomiting This list may not describe all possible side effects. Call your doctor  for medical advice about side effects. You may report side effects to FDA at 1-800-FDA-1088. Where should I keep my medicine? Keep out of the reach of children. This medicine can be abused. Keep your medicine in a safe place to protect it from theft. Do not share this medicine with anyone. Selling or giving away this medicine is dangerous and against the law. Store at room temperature between 15 and 30 degrees C (59 and 86 degrees F). Keep container tightly closed. This medicine may cause accidental overdose and death if it is taken by other adults, children, or pets. Flush any unused medicine down the toilet to reduce the chance of harm. Do not use the medicine after the expiration date. NOTE: This sheet is a summary. It may not cover all possible information. If you have questions about this medicine, talk to your doctor, pharmacist, or health care provider.    2016, Elsevier/Gold Standard. (2012-09-05 09:51:44)

## 2015-11-28 ENCOUNTER — Emergency Department
Admission: EM | Admit: 2015-11-28 | Discharge: 2015-11-28 | Disposition: A | Discharge status: Home / Self Care | Payer: Self-pay | Attending: Emergency Medicine | Admitting: Emergency Medicine

## 2015-11-28 ENCOUNTER — Emergency Department: Payer: Self-pay

## 2015-11-28 ENCOUNTER — Encounter: Payer: Self-pay | Admitting: Emergency Medicine

## 2015-11-28 DIAGNOSIS — Y939 Activity, unspecified: Secondary | ICD-10-CM | POA: Insufficient documentation

## 2015-11-28 DIAGNOSIS — Y929 Unspecified place or not applicable: Secondary | ICD-10-CM | POA: Insufficient documentation

## 2015-11-28 DIAGNOSIS — Z791 Long term (current) use of non-steroidal anti-inflammatories (NSAID): Secondary | ICD-10-CM | POA: Insufficient documentation

## 2015-11-28 DIAGNOSIS — Y999 Unspecified external cause status: Secondary | ICD-10-CM | POA: Insufficient documentation

## 2015-11-28 DIAGNOSIS — S20212A Contusion of left front wall of thorax, initial encounter: Secondary | ICD-10-CM | POA: Insufficient documentation

## 2015-11-28 DIAGNOSIS — F17200 Nicotine dependence, unspecified, uncomplicated: Secondary | ICD-10-CM | POA: Insufficient documentation

## 2015-11-28 DIAGNOSIS — W541XXA Struck by dog, initial encounter: Secondary | ICD-10-CM | POA: Insufficient documentation

## 2015-11-28 MED ORDER — HYDROCODONE-ACETAMINOPHEN 5-325 MG PO TABS: 1.0000 | ORAL_TABLET | ORAL | 0 refills | Status: DC | PRN

## 2015-11-28 NOTE — ED Provider Notes (Signed)
[facility] Emergency Department Provider Note  ____________________________________________   First MD Initiated Contact with Patient 11/28/15 0543     (approximate)  I have reviewed the triage vital signs and the nursing notes.   HISTORY  Chief Complaint Shortness of Breath    HPI Claire Brown is a 55 y.o. female who presents for evaluation of left lateral and slightly posterior rib pain.  She had a fall days ago and struck her ribs on some steps.  She has been very anxious recently because she discovered her husband dead in their house 6 days ago and apparently the fall causing the contusion on her ribs was a result of that issue.  She states the pain is worse when she takes a deep breath although she is not having any difficulty breathing.  She denies any other chest pain.  She also denies fever/chills, abdominal pain, nausea, vomiting.  She states the pain is mild, sharp, and aching.   Past Medical History:  Diagnosis Date  . Anemia   . Anxiety   . Arthritis   . Bipolar disorder ([place])   . Headache     Patient Active Problem List   Diagnosis Date Noted  . S/P laparoscopic hysterectomy 10/14/2015  . Ovarian mass, right 09/24/2015    Past Surgical History:  Procedure Laterality Date  . CESAREAN SECTION    . CYSTOSCOPY N/A 10/14/2015   Procedure: CYSTOSCOPY;  Surgeon: Malachy Mood, MD;  Location: [facility] ORS;  Service: Gynecology;  Laterality: N/A;  . KNEE SURGERY Bilateral   . LAPAROSCOPIC BILATERAL SALPINGO OOPHERECTOMY  10/14/2015   Procedure: LAPAROSCOPIC BILATERAL SALPINGO OOPHORECTOMY;  Surgeon: Malachy Mood, MD;  Location: [facility] ORS;  Service: Gynecology;;  . LAPAROSCOPIC HYSTERECTOMY N/A 10/14/2015   Procedure: HYSTERECTOMY TOTAL LAPAROSCOPIC;  Surgeon: Malachy Mood, MD;  Location: [facility] ORS;  Service: Gynecology;  Laterality: N/A;    Prior to Admission medications   Medication Sig Start Date End Date Taking? Authorizing  Provider  diphenhydrAMINE (BENADRYL) 25 MG tablet Take 25 mg by mouth every 6 (six) hours as needed for allergies.    Historical Provider, MD  HYDROcodone-acetaminophen (NORCO/VICODIN) 5-325 MG tablet Take 1-2 tablets by mouth every 4 (four) hours as needed for moderate pain. 11/28/15   Hinda Kehr, MD  HYDROmorphone (DILAUDID) 2 MG tablet Take 1 tablet (2 mg total) by mouth every 3 (three) hours as needed for moderate pain or severe pain. 10/15/15   Malachy Mood, MD  ibuprofen (ADVIL,MOTRIN) 600 MG tablet Take 1 tablet (600 mg total) by mouth every 6 (six) hours as needed for fever or headache. 10/15/15   Malachy Mood, MD  Multiple Vitamins-Minerals (MULTIVITAMIN WITH MINERALS) tablet Take 1 tablet by mouth daily.    Historical Provider, MD  ondansetron (ZOFRAN) 4 MG tablet Take 1-2 tabs by mouth every 8 hours as needed for nausea/vomiting 09/10/15   Hinda Kehr, MD    Allergies Augmentin [amoxicillin-pot clavulanate]; Oxycodone; Penicillins; and Valium [diazepam]  Family History  Problem Relation Age of Onset  . Cancer Brother     loyphoma    Social History Social History  Substance Use Topics  . Smoking status: Current Every Day Smoker    Packs/day: 1.00  . Smokeless tobacco: Never Used  . Alcohol use Yes     Comment: OCC    Review of Systems Constitutional: No fever/chills Eyes: No visual changes. ENT: No sore throat. Cardiovascular: Bruise on her ribs with accompanying chest wall pain Respiratory: Denies shortness of breath. Gastrointestinal:  No abdominal pain.  No nausea, no vomiting.  No diarrhea.  No constipation. Genitourinary: Negative for dysuria. Musculoskeletal: Negative for back pain. Skin: Negative for rash. Neurological: Negative for headaches, focal weakness or numbness.  10-point ROS otherwise negative.  ____________________________________________   PHYSICAL EXAM:  VITAL SIGNS: ED Triage Vitals  Enc Vitals Group     BP 11/28/15 0421 101/68       Pulse Rate 11/28/15 0419 88     Resp 11/28/15 0419 18     Temp 11/28/15 0419 97.7 F (36.5 C)     Temp Source 11/28/15 0419 Oral     SpO2 11/28/15 0419 98 %     Weight 11/28/15 0419 112 lb (50.8 kg)     Height 11/28/15 0419 5' (1.524 m)     Head Circumference --      Peak Flow --      Pain Score 11/28/15 0420 8     Pain Loc --      Pain Edu? --      Excl. in [place]? --     Constitutional: Alert and oriented. Well appearing and in no acute distress. Eyes: Conjunctivae are normal. PERRL. EOMI. Head: Atraumatic. Nose: No congestion/rhinnorhea. Mouth/Throat: Mucous membranes are moist.  Oropharynx non-erythematous. Neck: No stridor.  No meningeal signs.   Cardiovascular: Normal rate, regular rhythm. Good peripheral circulation. Grossly normal heart sounds.  Mild lateral rib tenderness to palpation. Respiratory: Normal respiratory effort.  No retractions. Lungs CTAB. Gastrointestinal: Soft and nontender. No distention.  Musculoskeletal: No lower extremity tenderness nor edema. No gross deformities of extremities. Neurologic:  Normal speech and language. No gross focal neurologic deficits are appreciated.  Skin:  Skin is warm, dry and intact. No rash noted.  Sub-acute bruise on her ribs that is yellowing and appears to be healing.   Psychiatric: Mood and affect are tearful when talking about her dead husband. Speech and behavior are normal.  ____________________________________________   LABS (all labs ordered are listed, but only abnormal results are displayed)  Labs Reviewed - No data to display ____________________________________________  EKG  None - EKG not ordered by ED physician ____________________________________________  RADIOLOGY   Dg Chest 2 View  Result Date: 11/28/2015 CLINICAL DATA:  55 y/o F; status post fall on Friday hitting left ribs on steps with bruising noted. EXAM: CHEST  2 VIEW COMPARISON:  10/06/2015 chest radiograph FINDINGS: Stable  cardiomediastinal silhouette within normal limits given projection and technique. Clear lungs. No pleural effusion. No pneumothorax. Left lateral fifth rib mildly displaced fracture was present on prior radiographs. No new fracture is identified. IMPRESSION: Left lateral fifth rib mildly displaced fracture was present on prior radiographs. No new fracture is identified. Clear lungs. Electronically Signed   By: Kristine Garbe M.D.   On: 11/28/2015 04:58    ____________________________________________   PROCEDURES  Procedure(s) performed:   Procedures   Critical Care performed: No ____________________________________________   INITIAL IMPRESSION / ASSESSMENT AND PLAN / ED COURSE  Pertinent labs & imaging results that were available during my care of the patient were reviewed by me and considered in my medical decision making (see chart for details).  The patient has a subacute contusion to her ribs but a reassuring x-ray.  She confirmed that the radiologist was cracked in seeing an old rib fracture.  I provided an incentive spirometer and encouraged her to use it regularly.  I also provided a small number of pain pills to help with the acute pain to make sure that  she will continue taking adequately deep breaths.  I encouraged her to follow up with her primary care doctor at the next available opportunity.  I also gave her the information about [facility] so that she would have somebody to talk to regarding the recent death of her husband.  She and her son understand and agree with the plan.    ____________________________________________  FINAL CLINICAL IMPRESSION(S) / ED DIAGNOSES  Final diagnoses:  Rib contusion, left, initial encounter     MEDICATIONS GIVEN DURING THIS VISIT:  Medications - No data to display   NEW OUTPATIENT MEDICATIONS STARTED DURING THIS VISIT:  Discharge Medication List as of 11/28/2015  7:26 AM    START taking these medications   Details    HYDROcodone-acetaminophen (NORCO/VICODIN) 5-325 MG tablet Take 1-2 tablets by mouth every 4 (four) hours as needed for moderate pain., Starting Fri 11/28/2015, Print        Discharge Medication List as of 11/28/2015  7:26 AM      Discharge Medication List as of 11/28/2015  7:26 AM       Note:  This document was prepared using Dragon voice recognition software and may include unintentional dictation errors.    Hinda Kehr, MD 11/28/15 [phone]

## 2015-11-28 NOTE — ED Notes (Signed)
PT ambulatory to discharge. NAD noted. Instructed on use of incentive spirometer. Pt demonstrated and verbalized understanding.

## 2015-11-28 NOTE — Discharge Instructions (Signed)
As we discussed, there is no evidence of new rib fractures.  You do have a significant contusion and we encourage you to use the incentive spirometer as demonstrated.  Use over-the-counter pain medication as needed and according to label instructions.  Take Norco as prescribed for severe pain. Do not drink alcohol, drive or participate in any other potentially dangerous activities while taking this medication as it may make you sleepy. Do not take this medication with any other sedating medications, either prescription or over-the-counter. If you were prescribed Percocet or Vicodin, do not take these with acetaminophen (Tylenol) as it is already contained within these medications.   This medication is an opiate (or narcotic) pain medication and can be habit forming.  Use it as little as possible to achieve adequate pain control.  Do not use or use it with extreme caution if you have a history of opiate abuse or dependence.  If you are on a pain contract with your primary care doctor or a pain specialist, be sure to let them know you were prescribed this medication today from the [facility] Department.  This medication is intended for your use only - do not give any to anyone else and keep it in a secure place where nobody else, especially children, have access to it.  It will also cause or worsen constipation, so you may want to consider taking an over-the-counter stool softener while you are taking this medication.  Besides following up with your primary care doctor, you may want to follow up with [facility], a mental health services provider, who may be able to offer assistance in dealing with this difficult time.

## 2015-11-28 NOTE — ED Notes (Signed)
Pt also reports that she drinks a couple of beers and wine every day, but that she has not had any alcohol since 05-31-2022.  Pt reports finding her husband dead in their house on 2022-05-31 evening and that during EMS visit, pt was knocked down by her dogs.  Pt has large bruise to right posterior rib cage.

## 2015-11-28 NOTE — ED Triage Notes (Signed)
Pt states she fell on 2022/05/25 hitting left ribs on steps, bruising noted. Pt states she has been very anxious states found her husband dead on 2022/05/25.

## 2015-12-17 HISTORY — PX: FRACTURE SURGERY: SHX138

## 2016-03-28 ENCOUNTER — Emergency Department: Payer: Self-pay

## 2016-03-28 DIAGNOSIS — R358 Other polyuria: Secondary | ICD-10-CM | POA: Diagnosis present

## 2016-03-28 DIAGNOSIS — E162 Hypoglycemia, unspecified: Secondary | ICD-10-CM | POA: Diagnosis present

## 2016-03-28 DIAGNOSIS — R631 Polydipsia: Secondary | ICD-10-CM | POA: Diagnosis present

## 2016-03-28 DIAGNOSIS — W010XXA Fall on same level from slipping, tripping and stumbling without subsequent striking against object, initial encounter: Secondary | ICD-10-CM | POA: Diagnosis present

## 2016-03-28 DIAGNOSIS — R579 Shock, unspecified: Secondary | ICD-10-CM | POA: Diagnosis present

## 2016-03-28 DIAGNOSIS — G629 Polyneuropathy, unspecified: Secondary | ICD-10-CM | POA: Diagnosis present

## 2016-03-28 DIAGNOSIS — M25552 Pain in left hip: Principal | ICD-10-CM | POA: Diagnosis present

## 2016-03-28 DIAGNOSIS — Z888 Allergy status to other drugs, medicaments and biological substances status: Secondary | ICD-10-CM

## 2016-03-28 DIAGNOSIS — Y93K1 Activity, walking an animal: Secondary | ICD-10-CM

## 2016-03-28 DIAGNOSIS — Z9181 History of falling: Secondary | ICD-10-CM

## 2016-03-28 DIAGNOSIS — Z880 Allergy status to penicillin: Secondary | ICD-10-CM

## 2016-03-28 DIAGNOSIS — Z9889 Other specified postprocedural states: Secondary | ICD-10-CM

## 2016-03-28 DIAGNOSIS — Z885 Allergy status to narcotic agent status: Secondary | ICD-10-CM

## 2016-03-28 DIAGNOSIS — E876 Hypokalemia: Secondary | ICD-10-CM | POA: Diagnosis present

## 2016-03-28 NOTE — ED Triage Notes (Signed)
Pt ambulatory to registration desk with no difficulty. Pt reports she slipped in some mud yesterday and fell landing on her left hip. Pt had hip surgery Nov of 2017, Pt reports pain in her hip and pain radiating down her leg to her knee. Pt placed in wheelchair.

## 2016-03-29 ENCOUNTER — Encounter: Payer: Self-pay | Admitting: *Deleted

## 2016-03-29 ENCOUNTER — Emergency Department: Payer: Self-pay

## 2016-03-29 ENCOUNTER — Inpatient Hospital Stay
Admission: EM | Admit: 2016-03-29 | Discharge: 2016-03-29 | DRG: 556 | Discharge status: Against Medical Advice | Payer: Self-pay | Attending: Pulmonary Disease | Admitting: Pulmonary Disease

## 2016-03-29 ENCOUNTER — Inpatient Hospital Stay (HOSPITAL_COMMUNITY)
Admit: 2016-03-29 | Discharge: 2016-03-29 | Disposition: A | Discharge status: Home / Self Care | Payer: Self-pay | Attending: Adult Health | Admitting: Adult Health

## 2016-03-29 DIAGNOSIS — R262 Difficulty in walking, not elsewhere classified: Secondary | ICD-10-CM

## 2016-03-29 DIAGNOSIS — I959 Hypotension, unspecified: Secondary | ICD-10-CM

## 2016-03-29 DIAGNOSIS — W19XXXA Unspecified fall, initial encounter: Secondary | ICD-10-CM

## 2016-03-29 DIAGNOSIS — G609 Hereditary and idiopathic neuropathy, unspecified: Secondary | ICD-10-CM

## 2016-03-29 DIAGNOSIS — A419 Sepsis, unspecified organism: Secondary | ICD-10-CM

## 2016-03-29 DIAGNOSIS — E162 Hypoglycemia, unspecified: Secondary | ICD-10-CM

## 2016-03-29 DIAGNOSIS — I340 Nonrheumatic mitral (valve) insufficiency: Secondary | ICD-10-CM

## 2016-03-29 DIAGNOSIS — R579 Shock, unspecified: Secondary | ICD-10-CM | POA: Diagnosis present

## 2016-03-29 DIAGNOSIS — R6521 Severe sepsis with septic shock: Secondary | ICD-10-CM

## 2016-03-29 LAB — COMPREHENSIVE METABOLIC PANEL
ALK PHOS: 126 U/L (ref 38–126)
ALT: 29 U/L (ref 14–54)
AST: 58 U/L — AB (ref 15–41)
Albumin: 2.3 g/dL — ABNORMAL LOW (ref 3.5–5.0)
Anion gap: 13 (ref 5–15)
BILIRUBIN TOTAL: 0.8 mg/dL (ref 0.3–1.2)
BUN: 11 mg/dL (ref 6–20)
CALCIUM: 8.3 mg/dL — AB (ref 8.9–10.3)
CO2: 18 mmol/L — ABNORMAL LOW (ref 22–32)
CREATININE: 0.66 mg/dL (ref 0.44–1.00)
Chloride: 109 mmol/L (ref 101–111)
Glucose, Bld: 63 mg/dL — ABNORMAL LOW (ref 65–99)
Potassium: 3.3 mmol/L — ABNORMAL LOW (ref 3.5–5.1)
Sodium: 140 mmol/L (ref 135–145)
Total Protein: 5.6 g/dL — ABNORMAL LOW (ref 6.5–8.1)

## 2016-03-29 LAB — BASIC METABOLIC PANEL
Anion gap: 5 (ref 5–15)
BUN: 8 mg/dL (ref 6–20)
CO2: 17 mmol/L — ABNORMAL LOW (ref 22–32)
Calcium: 7.1 mg/dL — ABNORMAL LOW (ref 8.9–10.3)
Chloride: 118 mmol/L — ABNORMAL HIGH (ref 101–111)
Creatinine, Ser: 0.56 mg/dL (ref 0.44–1.00)
GFR calc Af Amer: >60 mL/min (ref >60)
GLUCOSE: 72 mg/dL (ref 65–99)
POTASSIUM: 3.3 mmol/L — AB (ref 3.5–5.1)
Sodium: 140 mmol/L (ref 135–145)

## 2016-03-29 LAB — URINALYSIS, COMPLETE (UACMP) WITH MICROSCOPIC
BACTERIA UA: NONE SEEN
BILIRUBIN URINE: NEGATIVE
GLUCOSE, UA: NEGATIVE mg/dL
Hgb urine dipstick: NEGATIVE
KETONES UR: NEGATIVE mg/dL
Nitrite: NEGATIVE
PH: 5 (ref 5.0–8.0)
PROTEIN: NEGATIVE mg/dL
Specific Gravity, Urine: 1.006 (ref 1.005–1.030)

## 2016-03-29 LAB — CBC
HCT: 37.5 % (ref 35.0–47.0)
Hemoglobin: 12.9 g/dL (ref 12.0–16.0)
MCH: 36.8 pg — AB (ref 26.0–34.0)
MCHC: 34.3 g/dL (ref 32.0–36.0)
MCV: 107.3 fL — AB (ref 80.0–100.0)
PLATELETS: 148 10*3/uL — AB (ref 150–440)
RBC: 3.49 MIL/uL — AB (ref 3.80–5.20)
RDW: 18.6 % — AB (ref 11.5–14.5)
WBC: 4 10*3/uL (ref 3.6–11.0)

## 2016-03-29 LAB — MRSA PCR SCREENING: MRSA BY PCR: NEGATIVE

## 2016-03-29 LAB — CBC WITH DIFFERENTIAL/PLATELET
BASOS ABS: 0.1 10*3/uL (ref 0–0.1)
Basophils Relative: 1 %
EOS PCT: 6 %
Eosinophils Absolute: 0.4 10*3/uL (ref 0–0.7)
HEMATOCRIT: 42.2 % (ref 35.0–47.0)
Hemoglobin: 14 g/dL (ref 12.0–16.0)
LYMPHS PCT: 54 %
Lymphs Abs: 3.8 10*3/uL — ABNORMAL HIGH (ref 1.0–3.6)
MCH: 35.8 pg — AB (ref 26.0–34.0)
MCHC: 33.1 g/dL (ref 32.0–36.0)
MCV: 108.2 fL — AB (ref 80.0–100.0)
Monocytes Absolute: 0.4 10*3/uL (ref 0.2–0.9)
Monocytes Relative: 5 %
NEUTROS ABS: 2.4 10*3/uL (ref 1.4–6.5)
Neutrophils Relative %: 34 %
Platelets: 163 10*3/uL (ref 150–440)
RBC: 3.9 MIL/uL (ref 3.80–5.20)
RDW: 19 % — ABNORMAL HIGH (ref 11.5–14.5)
WBC: 7 10*3/uL (ref 3.6–11.0)

## 2016-03-29 LAB — PROCALCITONIN

## 2016-03-29 LAB — BLOOD GAS, ARTERIAL
ACID-BASE DEFICIT: 9 mmol/L — AB (ref 0.0–2.0)
BICARBONATE: 14.5 mmol/L — AB (ref 20.0–28.0)
FIO2: 0.21
O2 Saturation: 97.2 %
PH ART: 7.39 (ref 7.350–7.450)
PO2 ART: 94 mmHg (ref 83.0–108.0)
Patient temperature: 37
pCO2 arterial: 24 mmHg — ABNORMAL LOW (ref 32.0–48.0)

## 2016-03-29 LAB — CORTISOL: Cortisol, Plasma: >100 ug/dL

## 2016-03-29 LAB — TSH: TSH: 1.692 u[IU]/mL (ref 0.350–4.500)

## 2016-03-29 LAB — TYPE AND SCREEN
ABO/RH(D): A POS
Antibody Screen: NEGATIVE

## 2016-03-29 LAB — TROPONIN I

## 2016-03-29 LAB — GLUCOSE, CAPILLARY
GLUCOSE-CAPILLARY: 69 mg/dL (ref 65–99)
GLUCOSE-CAPILLARY: 72 mg/dL (ref 65–99)
Glucose-Capillary: 59 mg/dL — ABNORMAL LOW (ref 65–99)
Glucose-Capillary: 85 mg/dL (ref 65–99)

## 2016-03-29 LAB — MAGNESIUM: Magnesium: 1.7 mg/dL (ref 1.7–2.4)

## 2016-03-29 LAB — LACTIC ACID, PLASMA
LACTIC ACID, VENOUS: 4.8 mmol/L — AB (ref 0.5–1.9)
LACTIC ACID, VENOUS: 2.7 mmol/L — AB (ref 0.5–1.9)
LACTIC ACID, VENOUS: 1.9 mmol/L (ref 0.5–1.9)

## 2016-03-29 LAB — ECHOCARDIOGRAM COMPLETE
HEIGHTINCHES: 60 in
Weight: 1820.12 oz

## 2016-03-29 LAB — PHOSPHORUS: Phosphorus: 2.7 mg/dL (ref 2.5–4.6)

## 2016-03-29 MED ORDER — ONDANSETRON HCL 4 MG/2ML IJ SOLN
INTRAMUSCULAR | Status: AC
  Administered 2016-03-29: 4 mg via INTRAVENOUS
  Filled 2016-03-29: qty 2

## 2016-03-29 MED ORDER — ONDANSETRON HCL 4 MG/2ML IJ SOLN
4.0000 mg | Freq: Once | INTRAMUSCULAR | Status: AC
  Administered 2016-03-29: 4 mg via INTRAVENOUS

## 2016-03-29 MED ORDER — SODIUM CHLORIDE 0.45 % IV SOLN
INTRAVENOUS | Status: DC
Start: 2016-03-29 — End: 2016-03-29
  Administered 2016-03-29: 10:00:00 via INTRAVENOUS
  Filled 2016-03-29: qty 1000

## 2016-03-29 MED ORDER — SODIUM CHLORIDE 0.9 % IV BOLUS (SEPSIS)
500.0000 mL | Freq: Once | INTRAVENOUS | Status: AC
  Administered 2016-03-29: 500 mL via INTRAVENOUS

## 2016-03-29 MED ORDER — HEPARIN SODIUM (PORCINE) 5000 UNIT/ML IJ SOLN
5000.0000 [IU] | Freq: Three times a day (TID) | INTRAMUSCULAR | Status: DC
  Administered 2016-03-29: 5000 [IU] via SUBCUTANEOUS
  Filled 2016-03-29: qty 1

## 2016-03-29 MED ORDER — BUPROPION HCL ER (XL) 150 MG PO TB24
150.0000 mg | ORAL_TABLET | Freq: Every day | ORAL | Status: DC
  Administered 2016-03-29: 150 mg via ORAL
  Filled 2016-03-29: qty 1

## 2016-03-29 MED ORDER — POTASSIUM CHLORIDE CRYS ER 20 MEQ PO TBCR
40.0000 meq | EXTENDED_RELEASE_TABLET | Freq: Every day | ORAL | Status: DC
  Administered 2016-03-29: 40 meq via ORAL
  Filled 2016-03-29 (×2): qty 2

## 2016-03-29 MED ORDER — DEXTROSE 5 % IV SOLN: 2.0000 g | Freq: Once | INTRAVENOUS | Status: DC

## 2016-03-29 MED ORDER — ENOXAPARIN SODIUM 30 MG/0.3ML ~~LOC~~ SOLN: 30.0000 mg | SUBCUTANEOUS | Status: DC

## 2016-03-29 MED ORDER — ASPIRIN 325 MG PO TABS
325.0000 mg | ORAL_TABLET | Freq: Every day | ORAL | Status: DC
  Administered 2016-03-29: 325 mg via ORAL
  Filled 2016-03-29: qty 1

## 2016-03-29 MED ORDER — SODIUM CHLORIDE 0.9 % IV SOLN
1.0000 mg | Freq: Every day | INTRAVENOUS | Status: DC
  Administered 2016-03-29: 1 mg via INTRAVENOUS
  Filled 2016-03-29 (×2): qty 0.2

## 2016-03-29 MED ORDER — SODIUM CHLORIDE 0.9 % IV SOLN: 1.0000 g | Freq: Three times a day (TID) | INTRAVENOUS | Status: DC

## 2016-03-29 MED ORDER — FOLIC ACID 5 MG/ML IJ SOLN: 1.0000 mg | Freq: Every day | INTRAMUSCULAR | Status: DC

## 2016-03-29 MED ORDER — HYDROCORTISONE NA SUCCINATE PF 100 MG IJ SOLR
100.0000 mg | Freq: Once | INTRAMUSCULAR | Status: AC
  Administered 2016-03-29: 100 mg via INTRAVENOUS
  Filled 2016-03-29: qty 2

## 2016-03-29 MED ORDER — ENOXAPARIN SODIUM 40 MG/0.4ML ~~LOC~~ SOLN: 40.0000 mg | SUBCUTANEOUS | Status: DC

## 2016-03-29 MED ORDER — MEROPENEM-SODIUM CHLORIDE 1 GM/50ML IV SOLR
1.0000 g | Freq: Three times a day (TID) | INTRAVENOUS | Status: DC
  Administered 2016-03-29: 1 g via INTRAVENOUS
  Filled 2016-03-29 (×3): qty 50

## 2016-03-29 MED ORDER — ACETAMINOPHEN 325 MG PO TABS
650.0000 mg | ORAL_TABLET | ORAL | Status: DC | PRN
  Administered 2016-03-29 (×2): 650 mg via ORAL
  Filled 2016-03-29 (×2): qty 2

## 2016-03-29 MED ORDER — VANCOMYCIN HCL IN DEXTROSE 1-5 GM/200ML-% IV SOLN
1000.0000 mg | Freq: Once | INTRAVENOUS | Status: AC
  Administered 2016-03-29: 1000 mg via INTRAVENOUS
  Filled 2016-03-29: qty 200

## 2016-03-29 MED ORDER — SODIUM CHLORIDE 0.9 % IV SOLN: 250.0000 mL | INTRAVENOUS | Status: DC | PRN

## 2016-03-29 MED ORDER — ONDANSETRON HCL 4 MG/2ML IJ SOLN
4.0000 mg | Freq: Four times a day (QID) | INTRAMUSCULAR | Status: DC | PRN
  Administered 2016-03-29: 4 mg via INTRAVENOUS
  Filled 2016-03-29: qty 2

## 2016-03-29 MED ORDER — KCL IN DEXTROSE-NACL 40-5-0.45 MEQ/L-%-% IV SOLN
INTRAVENOUS | Status: DC
  Administered 2016-03-29: 15:00:00 via INTRAVENOUS
  Filled 2016-03-29 (×2): qty 1000

## 2016-03-29 MED ORDER — ENSURE ENLIVE PO LIQD
237.0000 mL | Freq: Three times a day (TID) | ORAL | Status: DC
  Administered 2016-03-29 (×2): 237 mL via ORAL

## 2016-03-29 MED ORDER — FAMOTIDINE 20 MG PO TABS: 20.0000 mg | ORAL_TABLET | Freq: Every day | ORAL | Status: DC

## 2016-03-29 MED ORDER — LEVOFLOXACIN IN D5W 750 MG/150ML IV SOLN
750.0000 mg | Freq: Once | INTRAVENOUS | Status: AC
  Administered 2016-03-29: 750 mg via INTRAVENOUS
  Filled 2016-03-29: qty 150

## 2016-03-29 MED ORDER — SODIUM CHLORIDE 0.9 % IV BOLUS (SEPSIS)
1000.0000 mL | Freq: Once | INTRAVENOUS | Status: AC
  Administered 2016-03-29: 1000 mL via INTRAVENOUS

## 2016-03-29 MED ORDER — SODIUM CHLORIDE 0.9 % IV SOLN
INTRAVENOUS | Status: DC
  Administered 2016-03-29: 06:00:00 via INTRAVENOUS

## 2016-03-29 MED ORDER — VITAMIN B-12 1000 MCG PO TABS
1000.0000 ug | ORAL_TABLET | Freq: Every day | ORAL | Status: DC
  Administered 2016-03-29: 1000 ug via ORAL
  Filled 2016-03-29: qty 1

## 2016-03-29 MED ORDER — SODIUM CHLORIDE 0.9 % IV BOLUS (SEPSIS)
250.0000 mL | Freq: Once | INTRAVENOUS | Status: AC
  Administered 2016-03-29: 250 mL via INTRAVENOUS

## 2016-03-29 MED ORDER — METOCLOPRAMIDE HCL 5 MG/ML IJ SOLN
5.0000 mg | Freq: Once | INTRAMUSCULAR | Status: AC
  Administered 2016-03-29: 5 mg via INTRAVENOUS

## 2016-03-29 MED ORDER — METOCLOPRAMIDE HCL 5 MG/ML IJ SOLN
INTRAMUSCULAR | Status: AC
  Filled 2016-03-29: qty 2

## 2016-03-29 NOTE — Progress Notes (Signed)
1208 Report called to [facility] RN on 2C.

## 2016-03-29 NOTE — ED Notes (Signed)
Almyra Free NT unable to successfully draw lactic acid

## 2016-03-29 NOTE — ED Notes (Signed)
Pt brought to triage to check vital signs; BP 71/51; pt says her pressure ran a little low when she was in the hospital last fall but it was over 90/; pt says she has been feeling weak for the last few days with some dizziness; pt says she had hypoglycemia when she spent 9 days in the hospital in [place] about 6 weeks ago; was not sent home on any medications and has not followed up with anyone since; pt awake and alert in triage;

## 2016-03-29 NOTE — ED Notes (Signed)
RN waiting for assistance with blood culture draw to start antibiotics

## 2016-03-29 NOTE — ED Notes (Signed)
Patient came in today after mechanical fall yesterday over her dogs in the yard. Pt c/o of left hip pain radiating down leg and into back.    Pt was seen at [facility] in January for hypoglycemic seizure and admitted for 9 days. Pt reports intermittent N/V since discharge from [place].   Pt reports 2 emeses yesterday, and one emesis in the ED today in presence of RN.   Pt c/o polydipsia, and polyuria X 3 days.    Patient c/o intermittent LLQ abdominal pain.  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  Pt reports "dark" bowel movements. Patient denies hematuria, and hematemesis.

## 2016-03-29 NOTE — ED Provider Notes (Signed)
[facility] Emergency Department Provider Note  ____________________________________________   First MD Initiated Contact with Patient 03/29/16 0112     (approximate)  I have reviewed the triage vital signs and the nursing notes.   HISTORY  Chief Complaint Hip Pain and Fall   HPI Claire Brown is a 56 y.o. female with a history of bipolar disorder who is presenting to the emergency department today with left hip as well as left knee pain. She says that she slipped on Monday yesterday and fell to her left side falling onto her left hip. She says that the pain now is in her left hip and also radiating down her left knee. She came to the emergency department for further evaluation because of the pain. When she was having her vitals checked she was found to have a blood pressure of 78 systolic. She says that her baseline blood pressure is about 90 systolic. She denies any feeling that she will pass out. She denies any blood in her stool but does say that she vomited once after falling yesterday. Does not reporting hitting her head or any headache at this time.   Past Medical History:  Diagnosis Date  . Anemia   . Anxiety   . Arthritis   . Bipolar disorder ([place])   . Headache     Patient Active Problem List   Diagnosis Date Noted  . S/P laparoscopic hysterectomy 10/14/2015  . Ovarian mass, right 09/24/2015    Past Surgical History:  Procedure Laterality Date  . CESAREAN SECTION    . CYSTOSCOPY N/A 10/14/2015   Procedure: CYSTOSCOPY;  Surgeon: Malachy Mood, MD;  Location: [facility] ORS;  Service: Gynecology;  Laterality: N/A;  . KNEE SURGERY Bilateral   . LAPAROSCOPIC BILATERAL SALPINGO OOPHERECTOMY  10/14/2015   Procedure: LAPAROSCOPIC BILATERAL SALPINGO OOPHORECTOMY;  Surgeon: Malachy Mood, MD;  Location: [facility] ORS;  Service: Gynecology;;  . LAPAROSCOPIC HYSTERECTOMY N/A 10/14/2015   Procedure: HYSTERECTOMY TOTAL LAPAROSCOPIC;  Surgeon: Malachy Mood, MD;  Location: [facility] ORS;  Service: Gynecology;  Laterality: N/A;    Prior to Admission medications   Medication Sig Start Date End Date Taking? Authorizing Provider  diphenhydrAMINE (BENADRYL) 25 MG tablet Take 25 mg by mouth every 6 (six) hours as needed for allergies.    Historical Provider, MD  HYDROcodone-acetaminophen (NORCO/VICODIN) 5-325 MG tablet Take 1-2 tablets by mouth every 4 (four) hours as needed for moderate pain. 11/28/15   Hinda Kehr, MD  HYDROmorphone (DILAUDID) 2 MG tablet Take 1 tablet (2 mg total) by mouth every 3 (three) hours as needed for moderate pain or severe pain. 10/15/15   Malachy Mood, MD  ibuprofen (ADVIL,MOTRIN) 600 MG tablet Take 1 tablet (600 mg total) by mouth every 6 (six) hours as needed for fever or headache. 10/15/15   Malachy Mood, MD  Multiple Vitamins-Minerals (MULTIVITAMIN WITH MINERALS) tablet Take 1 tablet by mouth daily.    Historical Provider, MD  ondansetron (ZOFRAN) 4 MG tablet Take 1-2 tabs by mouth every 8 hours as needed for nausea/vomiting 09/10/15   Hinda Kehr, MD    Allergies Augmentin [amoxicillin-pot clavulanate]; Oxycodone; Penicillins; and Valium [diazepam]  Family History  Problem Relation Age of Onset  . Cancer Brother     loyphoma    Social History Social History  Substance Use Topics  . Smoking status: Current Every Day Smoker    Packs/day: 1.00  . Smokeless tobacco: Never Used  . Alcohol use Yes     Comment: [place]  Review of Systems Constitutional: No fever/chills Eyes: No visual changes. ENT: No sore throat. Cardiovascular: Denies chest pain. Respiratory: Denies shortness of breath. Gastrointestinal: No abdominal pain.  No nausea, no vomiting.  No diarrhea.  No constipation. Genitourinary: Negative for dysuria. Musculoskeletal: Negative for back pain. Skin: Negative for rash. Neurological: Negative for headaches, focal weakness or numbness.  10-point ROS otherwise  negative.  ____________________________________________   PHYSICAL EXAM:  VITAL SIGNS: ED Triage Vitals  Enc Vitals Group     BP 03/29/16 0102 (!) 71/51     Pulse Rate 03/29/16 0102 89     Resp 03/29/16 0102 18     Temp 03/29/16 0102 97.5 F (36.4 C)     Temp Source 03/29/16 0102 Oral     SpO2 03/29/16 0102 100 %     Weight 03/28/16 2339 112 lb (50.8 kg)     Height 03/28/16 2339 5' (1.524 m)     Head Circumference --      Peak Flow --      Pain Score 03/28/16 2340 10     Pain Loc --      Pain Edu? --      Excl. in [place]? --     Constitutional: Alert and oriented. Well appearing and in no acute distress. Eyes: Conjunctivae are normal. PERRL. EOMI. Head: Atraumatic. Nose: No congestion/rhinnorhea. Mouth/Throat: Mucous membranes are moist.   Neck: No stridor.   Cardiovascular: Normal rate, regular rhythm. Grossly normal heart sounds.  Good peripheral circulation. Respiratory: Normal respiratory effort.  No retractions. Lungs CTAB. Gastrointestinal: Soft and nontender. No distention. Musculoskeletal: 5 out of 5 strength bilateral lower extremities. Sensation is intact to light touch. Minimal tenderness to the left hip without any deformity or ecchymosis. Neurologic:  Normal speech and language. No gross focal neurologic deficits are appreciated. Skin:  Skin is warm, dry and intact. No rash noted. Psychiatric: Mood and affect are normal. Speech and behavior are normal.  ____________________________________________   LABS (all labs ordered are listed, but only abnormal results are displayed)  Labs Reviewed  GLUCOSE, CAPILLARY - Abnormal; Notable for the following:       Result Value   Glucose-Capillary 59 (*)    All other components within normal limits  CBC WITH DIFFERENTIAL/PLATELET - Abnormal; Notable for the following:    MCV 108.2 (*)    MCH 35.8 (*)    RDW 19.0 (*)    Lymphs Abs 3.8 (*)    All other components within normal limits  COMPREHENSIVE METABOLIC PANEL -  Abnormal; Notable for the following:    Potassium 3.3 (*)    CO2 18 (*)    Glucose, Bld 63 (*)    Calcium 8.3 (*)    Total Protein 5.6 (*)    Albumin 2.3 (*)    AST 58 (*)    All other components within normal limits  LACTIC ACID, PLASMA - Abnormal; Notable for the following:    Lactic Acid, Venous 4.8 (*)    All other components within normal limits  CULTURE, BLOOD (ROUTINE X 2)  CULTURE, BLOOD (ROUTINE X 2)  URINE CULTURE  TROPONIN I  GLUCOSE, CAPILLARY  URINALYSIS, COMPLETE (UACMP) WITH MICROSCOPIC  LACTIC ACID, PLASMA  URINALYSIS, ROUTINE W REFLEX MICROSCOPIC  TYPE AND SCREEN   ____________________________________________  EKG  ED ECG REPORT I, Doran Stabler, the attending physician, personally viewed and interpreted this ECG.   Date: 03/29/2016  EKG Time: 0208  Rate: 76  Rhythm: normal sinus rhythm  Axis:  Normal  Intervals:none  ST&T Change: No ST segment elevation or depression. T-wave inversions in V2 through V4.  Near identical appearance of the EKG of 10/09/2015.  ____________________________________________  RADIOLOGY  DG Chest 1 View (Final result)  Result time 03/29/16 02:36:16  Final result by Lucienne Capers, MD (03/29/16 02:36:16)           Narrative:   CLINICAL DATA: Fall with hip and leg pain. Found to be hypotensive.  EXAM: CHEST 1 VIEW  COMPARISON: 11/28/2015  FINDINGS: The heart size and mediastinal contours are within normal limits. Both lungs are clear. The visualized skeletal structures are unremarkable.  IMPRESSION: No active disease.   Electronically Signed By: Lucienne Capers M.D. On: 03/29/2016 02:36            DG Pelvis 1-2 Views (Final result)  Result time 03/29/16 00:18:11  Final result by Lucienne Capers, MD (03/29/16 00:18:11)           Narrative:   CLINICAL DATA: Slip and fall injury, landing on the left hip. Left hip pain down to the left knee.  EXAM: PELVIS - 1-2  VIEW  COMPARISON: MRI pelvis 09/04/2015. CT abdomen and pelvis 09/03/2015.  FINDINGS: Postoperative changes with screw fixation of the left femoral neck. Degenerative changes in the left hip with narrowing and sclerosis of the superior acetabular joint and osteophytes on both sides of the joint. Prominent osteophyte may contribute to impingement syndrome. Less prominent degenerative changes on the right hip. Pelvis appears intact. No evidence of acute fracture or dislocation. SI joints and symphysis pubis are not displaced.  IMPRESSION: No acute bony abnormalities demonstrated. Previous screw fixation of the left hip. Degenerative changes in the left hip.   Electronically Signed By: Lucienne Capers M.D. On: 03/29/2016 00:18            DG FEMUR MIN 2 VIEWS LEFT (Final result)  Result time 03/29/16 00:19:54  Procedure changed from [place]  Final result by Lucienne Capers, MD (03/29/16 00:19:54)           Narrative:   CLINICAL DATA: Slip and fall injury with pain in the left hip radiating down to the knee.  EXAM: LEFT FEMUR 2 VIEWS  COMPARISON: Left knee 07/01/2015  FINDINGS: Degenerative changes in the left hip. Previous screw fixation of the left femoral neck. No evidence of acute fracture or dislocation of the left femur. Soft tissues are unremarkable.  IMPRESSION: No acute bony abnormalities. Degenerative changes in the left hip. Screw fixation of the left femoral neck.   Electronically Signed By: Lucienne Capers M.D. On: 03/29/2016 00:19            ____________________________________________   PROCEDURES  Procedure(s) performed:   Procedures  Critical Care performed:   CRITICAL CARE Performed by: Doran Stabler   Total critical care time: 35 minutes  Critical care time was exclusive of separately billable procedures and treating other patients.  Critical care was necessary to treat or prevent imminent  or life-threatening deterioration.  Critical care was time spent personally by me on the following activities: development of treatment plan with patient and/or surrogate as well as nursing, discussions with consultants, evaluation of patient's response to treatment, examination of patient, obtaining history from patient or surrogate, ordering and performing treatments and interventions, ordering and review of laboratory studies, ordering and review of radiographic studies, pulse oximetry and re-evaluation of patient's condition.  ____________________________________________   INITIAL IMPRESSION / ASSESSMENT AND PLAN / ED COURSE  Pertinent labs &  imaging results that were available during my care of the patient were reviewed by me and considered in my medical decision making (see chart for details).  ----------------------------------------- 2:14 AM on 03/29/2016 -----------------------------------------  Patient now feeling nauseous. Nurse to give Zofran.  Clinical Course as of Mar 29 334  Mon Mar 29, 2016  [id number] Discussed case with Dr. Lamonte Sakai of critical care who will be discussing the case with Dr. Ashby Dawes.  Patient almost finished her fluid bolus blood pressure still in the 70s but with a map of 63. Latest pressures 81/53.  [DS]    Clinical Course User Index [DS] Orbie Pyo, MD   ----------------------------------------- 3:36 AM on 03/29/2016 -----------------------------------------  Patient's pressure improving with fluids. Because the pressures on the 80s will not be starting a central line just yet. The patient seems to be improving clinically. To be admitted to the ICU. Patient aware of need for admission to the hospital. Patient with injury and hypotension. However, normal hemoglobin. Unlikely to be occult bleed.  ____________________________________________   FINAL CLINICAL IMPRESSION(S) / ED DIAGNOSES  Septic shock.   NEW MEDICATIONS STARTED DURING  THIS VISIT:  New Prescriptions   No medications on file     Note:  This document was prepared using Dragon voice recognition software and may include unintentional dictation errors.    Orbie Pyo, MD 03/29/16 [phone]

## 2016-03-29 NOTE — H&P (Signed)
PULMONARY / CRITICAL CARE MEDICINE   Name: Claire Brown MRN: [id number] DOB: 07/14/1960    ADMISSION DATE:  03/29/2016   REFERRING MD: Dr Clearnce Hasten  CHIEF COMPLAINT:  Fall and left hip pain s/p fall  HISTORY OF PRESENT ILLNESS:   56 year old Caucasian female with a past medical history of anemia, anxiety disorder, bipolar disorder, recent hysterectomy for benign ovarian cysts and tumors, unintentional weight loss, and headaches who presented for evaluation following a mechanical fall at home. Patient states that she was out with her dogs when she tripped and fell on her left hip. Pain got worse, hence she came to the ED for evaluation. At the ED, her imaging studies were negative for any acute fractures. However, she was found to be hypotensive with blood pressure of 71/51. Her labs showed a lactic acid level of 4.8 and a low potassium of 3.3 but otherwise, all other labs are normal. She denies fever, chills, mild nausea, vomiting but reports polyuria and polydipsia and no unintentional weight loss. Marland Kitchen Despite fluid resuscitation, her blood pressure remained low, hence PCCM was called to admit. She was recently hospitalized(02/10/16) with hypoglycemia, acute pancreatitis, ETOH abuse, lactic acidosis,  hypoglycemia, seizures, hypokalemia, and hypothermia. She also had a total hysterectomy  Patient's husband recently passed away in Dec 28, 2015.She became depressed and was started on wellbutrin. She takes care of 10 dogs. She denies ETOH abuse. She has lost abut 27 Lbs since November 2017. She sustained a fall with left a closed fracture of the left hip in November, 2017. She had an ORIF with cannulated screw fixation LEFT femoral neck fracture-01/09/2016   T MEDICAL HISTORY :  She  has a past medical history of Anemia; Anxiety; Arthritis; Bipolar disorder ([place]); and Headache.  PAST SURGICAL HISTORY: She  has a past surgical history that includes Cesarean section; Knee surgery (Bilateral);  Laparoscopic hysterectomy (N/A, 10/14/2015); Cystoscopy (N/A, 10/14/2015); and Laparoscopic bilateral salpingo oophorectomy (10/14/2015).  Allergies  Allergen Reactions  . Augmentin [Amoxicillin-Pot Clavulanate] Nausea Only  . Penicillins Itching and Other (See Comments)    Patient cannot remember the specifics of the reaction. Has taken Amoxicillin since with no problem.  . Oxycodone Anxiety and Other (See Comments)    Reaction: jittery/ unable to sleep  . Valium [Diazepam] Anxiety and Other (See Comments)    Reaction: unable to sleep    No current facility-administered medications on file prior to encounter.    Current Outpatient Prescriptions on File Prior to Encounter  Medication Sig  . diphenhydrAMINE (BENADRYL) 25 MG tablet Take 25 mg by mouth every 6 (six) hours as needed for allergies.    FAMILY HISTORY:  Her indicated that her mother is alive. She indicated that her father is deceased. She indicated that her brother is deceased.    SOCIAL HISTORY: She  reports that she has been smoking.  She has been smoking about 1.00 pack per day. She has never used smokeless tobacco. She reports that she drinks alcohol. She reports that she does not use drugs.  REVIEW OF SYSTEMS:   Constitutional: Negative for fever and chills.  HENT: Negative for congestion and rhinorrhea.  Eyes: Negative for redness and visual disturbance.  Respiratory: Negative for shortness of breath and wheezing.  Cardiovascular: Negative for chest pain and palpitations.  Gastrointestinal: Positive occasional nausea , anorexia but negative for vomiting and abdominal pain and  loose stools Genitourinary: Negative for dysuria and urgency.  Endocrine: Denies polyuria, polyphagia and heat intolerance Musculoskeletal: Positive for left hip pain,  recent fall and mild weakness Skin: Negative for pallor and wound.  Neurological: Negative for dizziness and headaches   SUBJECTIVE:   VITAL SIGNS: BP (!) 78/61   Pulse  (!) 101   Temp 97.5 F (36.4 C) (Oral)   Resp (!) 22   Ht 5' (1.524 m)   Wt 112 lb (50.8 kg)   SpO2 98%   BMI 21.87 kg/m   HEMODYNAMICS:    VENTILATOR SETTINGS:    INTAKE / OUTPUT: No intake/output data recorded.  PHYSICAL EXAMINATION: General: Older for age, no acute distress  Neuro: Alert and oriented 4. No focal deficits  HEENT: The most cephalic, atraumatic, and nasal passages without discharge, oral mucosa moist and pink, trachea midline  Cardiovascular: Rate and rhythm regular, S1, S2, no murmur, regurg or gallop, +2 pulses bilaterally, +1 nonpitting ankle edema  Lungs: Clear to auscultation bilaterally  Abdomen: Nondistended, normal bowel sounds in all 4 quadrants, palpation reveals left lower abdominal quadrant tenderness, no referred rebound tenderness, negative straight leg raise test  Musculoskeletal:Normal range of motion in upper extremities, pain with internal and external rotation of the left hip. No pain with extension and flexion  Skin:  Warm and dry  LABS:  BMET  Recent Labs Lab 03/29/16 0202  NA 140  K 3.3*  CL 109  CO2 18*  BUN 11  CREATININE 0.66  GLUCOSE 63*    Electrolytes  Recent Labs Lab 03/29/16 0202  CALCIUM 8.3*    CBC  Recent Labs Lab 03/29/16 0202  WBC 7.0  HGB 14.0  HCT 42.2  PLT 163    Coag's No results for input(s): APTT, INR in the last 168 hours.  Sepsis Markers  Recent Labs Lab 03/29/16 0202  LATICACIDVEN 4.8*    ABG No results for input(s): PHART, PCO2ART, PO2ART in the last 168 hours.  Liver Enzymes  Recent Labs Lab 03/29/16 0202  AST 58*  ALT 29  ALKPHOS 126  BILITOT 0.8  ALBUMIN 2.3*    Cardiac Enzymes  Recent Labs Lab 03/29/16 0202  TROPONINI <0.03    Glucose  Recent Labs Lab 03/29/16 0119 03/29/16 0245  GLUCAP 59* 69    Imaging Dg Chest 1 View  Result Date: 03/29/2016 CLINICAL DATA:  Fall with hip and leg pain. Found to be hypotensive. EXAM: CHEST 1 VIEW COMPARISON:   11/28/2015 FINDINGS: The heart size and mediastinal contours are within normal limits. Both lungs are clear. The visualized skeletal structures are unremarkable. IMPRESSION: No active disease. Electronically Signed   By: Lucienne Capers M.D.   On: 03/29/2016 02:36   Dg Pelvis 1-2 Views  Result Date: 03/29/2016 CLINICAL DATA:  Slip and fall injury, landing on the left hip. Left hip pain down to the left knee. EXAM: PELVIS - 1-2 VIEW COMPARISON:  MRI pelvis 09/04/2015. CT abdomen and pelvis 09/03/2015. FINDINGS: Postoperative changes with screw fixation of the left femoral neck. Degenerative changes in the left hip with narrowing and sclerosis of the superior acetabular joint and osteophytes on both sides of the joint. Prominent osteophyte may contribute to impingement syndrome. Less prominent degenerative changes on the right hip. Pelvis appears intact. No evidence of acute fracture or dislocation. SI joints and symphysis pubis are not displaced. IMPRESSION: No acute bony abnormalities demonstrated. Previous screw fixation of the left hip. Degenerative changes in the left hip. Electronically Signed   By: Lucienne Capers M.D.   On: 03/29/2016 00:18   Dg Femur Min 2 Views Left  Result Date: 03/29/2016 CLINICAL DATA:  Slip and fall injury with pain in the left hip radiating down to the knee. EXAM: LEFT FEMUR 2 VIEWS COMPARISON:  Left knee 07/01/2015 FINDINGS: Degenerative changes in the left hip. Previous screw fixation of the left femoral neck. No evidence of acute fracture or dislocation of the left femur. Soft tissues are unremarkable. IMPRESSION: No acute bony abnormalities. Degenerative changes in the left hip. Screw fixation of the left femoral neck. Electronically Signed   By: Lucienne Capers M.D.   On: 03/29/2016 00:19     STUDIES:  2-D echo pending  CULTURES: Blood cultures 2. Urine culture   ANTIBIOTICS: Meropenem for sepsis of unknown source  SIGNIFICANT EVENTS: 02/11-ED with left  hip pain, found to be hypotensive  LINES/TUBES: Peripheral IVs   DISCUSSION: 56 y/o caucasian female presenting with shock of unknown origin, lactic acidosis, and left hip pain status post mechanical fall  ASSESSMENT / PLAN:  CARDIOVASCULAR A:  Shock of unknown origin History of hypertension History of congestive heart failure  P:  Continue IV fluid resuscitation Hemodynamic monitoring per ICU protocol. Solu-Cortef 100 mg IV 1. Neo-Synephrine infusion, titrate to maintain mean arterial blood pressure greater than 65 Patient's blood pressures is improving hence there is no indication for central venous catheter at this time   RENAL A:   Hypokalemia  P:   Monitor and replace electrolytes  GASTROINTESTINAL A:   Anorexia.  Nausea  P:   Oral intake as tolerated. Ensure supplements 3 times daily  reglan prn for nausea PEPCID 20 mg daily Daily weights  HEMATOLOGIC A:   No acute issues P:  Monitor Hg and transfuse if <7g/dl  Infectious disease A:   ? Sepsis-source unclear however, patient recently had abdominal and left hip surgeries P:  -Given levaquin, vancomycin and aztreonam in the ED. Change Abx to meropenem while awaiting cultures -Trend procalcitonin and d/c abx if ncessary -f/u cultures  ENDOCRINE A:   Unintentional weight loss. Recurrent hypoglycemia -likely due to poor oral intake P:   Monitor weight closely Monitor blood glucose level. Obtain hemoglobin A1c. Obtain TSH  Musculoskeletal  A:   Left hip pain status post fall. History of left hip fracture, status post ORIF  P:   PT/OT evaluation and treatment. Orthopedic consult No weightbearing status  FAMILY  - Updates: Patient updated on current treatment plan. No family at bedside  - Inter-disciplinary family meet or Palliative Care meeting due by:  day 7  total CCM time=45 MINUTES  Magdalene S. [facility] ANP-BC Pulmonary and [place] Pager [phone]  or [phone] 03/29/2016, 5:01 AM   PCCM ATTENDING ATTESTATION:  I have evaluated patient with the APP [place], reviewed database in its entirety and discussed care plan in detail. In addition, this patient was discussed on multidisciplinary rounds. She presented to ED with L hip pain after recent fall and was noted to be hypotensive and hypoglycemic. Initial impression was severe sepsis but there is really no strong evidence of this. She has a propensity for falls and also has prior episodes of hypoglycemia. She has an unexplained peripheral neuropathy. Her BP is now normal. She still C/O L hip pain/. L hip plain Xray was normal (she has had prior hip surgery  Important exam findings: NAD Cognition intact HEENT WNL No JVD or LAN Chest clear Reg, no M NABS, abd soft No C/C/E. L hip with well healed scar and no swelling or erythema  CXR: NACPD Chemistries unremarkable CBC notable for MCV 108.2 First PCT is normal  Major problems addressed by PCCM team: Frequent falls Unexplained peripheral neuropathy Hypotension - resolved Hypoglycemia - resolved Severe macrocytosis   PLAN/REC: DC antibiotics Transfer to med-surg. Discussed with Dr Margaretmary Eddy and hospitalists will assume care as of AM 02/13 Echocardiogram, cortisol level, Vitamin B12 level, RBC folate ordered   Merton Border, MD PCCM service Mobile [phone] Pager [phone]

## 2016-03-29 NOTE — Progress Notes (Signed)
Pharmacy Antibiotic Note  Claire Brown is a 56 y.o. female admitted on 03/29/2016 with sepsis.  Pharmacy has been consulted for meropenem dosing.  Plan: Meropenem 1 gram q 8 hours ordered.  Height: 5' (152.4 cm) Weight: 112 lb (50.8 kg) IBW/kg (Calculated) : 45.5  Temp (24hrs), Avg:97.5 F (36.4 C), Min:97.5 F (36.4 C), Max:97.5 F (36.4 C)   Recent Labs Lab 03/29/16 0202  WBC 7.0  CREATININE 0.66  LATICACIDVEN 4.8*    Estimated Creatinine Clearance: 57.7 mL/min (by C-G formula based on SCr of 0.66 mg/dL).    Allergies  Allergen Reactions  . Augmentin [Amoxicillin-Pot Clavulanate] Nausea Only  . Penicillins Itching and Other (See Comments)    Patient cannot remember the specifics of the reaction. Has taken Amoxicillin since with no problem.  . Oxycodone Anxiety and Other (See Comments)    Reaction: jittery/ unable to sleep  . Valium [Diazepam] Anxiety and Other (See Comments)    Reaction: unable to sleep    Antimicrobials this admission: Vancomycin x1, Levaquin x1 >> meropenem 2/12   >>   Dose adjustments this admission:   Microbiology results: 2/12 BCx: pending 2/12 UCx: pending      2/12 UA: (-), LE( small) 2/12 CXR: no active disease  Thank you for allowing pharmacy to be a part of this patient's care.  Dametra Whetsel S 03/29/2016 4:05 AM

## 2016-03-29 NOTE — Progress Notes (Signed)
*  PRELIMINARY RESULTS* Echocardiogram 2D Echocardiogram has been performed.  Sherrie Sport 03/29/2016, 2:33 PM

## 2016-03-29 NOTE — ED Notes (Signed)
Gave patient 8 ounces of orange juice per MD request

## 2016-03-29 NOTE — ED Notes (Signed)
Patient c/o nausea. Pt has small emesis. MD Schaevitz informed. MD ordered IV zofran

## 2016-03-29 NOTE — Progress Notes (Signed)
Pt stating she wants to go home against medical advice.  She is aware of all the risk including potential death and/or blood pressure decreasing that may result in a fall causing serious injury.  She is alert and oriented follows commands.  Lungs are clear and NSR s1s2 no M/R/G.  The pt and her son are persistent with the pt leaving against medical advice, they both state if she develops any s/sx that are concerning she will come to the emergency room.  Marda Stalker, [place] Pager [phone] (please enter 7 digits) PCCM Consult Pager [phone] (please enter 7 digits)

## 2016-03-29 NOTE — ED Notes (Signed)
ED Provider at bedside. 

## 2016-03-29 NOTE — Progress Notes (Signed)
0830 Questioned by e-link about patients game plan for care. In ICU 14's room tied up due to her run of v-tach and ventilator issues while bathing patient. When ICU 14 was stable informed by the charge nurse that e-link had also called her about ICU 16. Patient is alert and oriented, eating and has no signs or symptoms of low B/P. Dr. Alva Garnet was consulted about the low B/P. Dr. Alva Garnet stated he would assess patient momentarily.

## 2016-03-29 NOTE — Progress Notes (Signed)
Pt requested to leave AMA.  Dr. Alva Garnet informed.    Explained to patient the multiple potential consequences for leaving AMA.  Pt verbalized understating of these consequences and still chose to leave AMA.  Hinton Dyer, NP from ICU spoke with pt at length.  Pt continued to leave AMA.  IV removed intact.  Pt AMA form signed and witnessed.

## 2016-03-29 NOTE — ED Notes (Signed)
Called lab to request lactic acid draw

## 2016-03-29 NOTE — ED Notes (Signed)
Attempted unsuccessfully to draw lactic acid.  Will contact another RN/NT to attempt draw.

## 2016-03-29 NOTE — Evaluation (Signed)
Physical Therapy Evaluation Patient Details Name: Claire Brown MRN: [id number] DOB: July 19, 1960 Today's Date: 03/29/2016   History of Present Illness  presented to ER status post fall in home environment with L hip pain; admitted with HCAP.  L hip imaging negative for acute injury.  Clinical Impression  Upon evaluation, patient alert and oriented; follows all commands and demonstrates good insight/safety awareness with functional activities.  Bilat UE/LE strength and ROM grossly symmetrical and WFL; mild L hip pain reported with abduction/lateral movement (but resolves with return to neutral).  Higher level balance deficits noted with periods of SLS (1-2 seconds only bilat); decreased use of ankle and hip strategy apparent.  Able to complete sit/stand, basic transfers and gait (50-150') without assist device, cga/min assist; improved to cga/close sup with use of RW.  Optimal safety, stability and overall gait fluidity/symmetry noted with use of Rw.  Recommend continued use of RW with all mobility at this time. Would benefit from skilled PT to address above deficits and promote optimal return to PLOF; recommend transition to home with outpatient PT follow up as appropriate.    Follow Up Recommendations Outpatient PT    Equipment Recommendations       Recommendations for Other Services       Precautions / Restrictions Precautions Precautions: Fall Restrictions Weight Bearing Restrictions: No      Mobility  Bed Mobility               General bed mobility comments: seated edge of bed upon arrival to session  Transfers Overall transfer level: Needs assistance Equipment used: Rolling walker (2 wheeled) Transfers: Sit to/from Stand Sit to Stand: Min guard;Supervision         General transfer comment: cuing for hand placement ot prevent pulling on RW  Ambulation/Gait Ambulation/Gait assistance: Min guard;Min assist Ambulation Distance (Feet): 50 Feet Assistive device:  None       General Gait Details: mild forward trunk flexion with decreased stance time/weight shift to L LE; mildly antalgic due to reported pain. No overt buckling or LOb, but does intermittently reach for furniture for stabilization as needed  Stairs            Wheelchair Mobility    Modified Rankin (Stroke Patients Only)       Balance Overall balance assessment: Needs assistance Sitting-balance support: No upper extremity supported;Feet supported Sitting balance-Leahy Scale: Normal     Standing balance support: No upper extremity supported Standing balance-Leahy Scale: Fair   Single Leg Stance - Right Leg: 2 Single Leg Stance - Left Leg: 2                         Pertinent Vitals/Pain Pain Assessment: Faces Faces Pain Scale: Hurts little more Pain Location: L hip, worsened with lateral movement Pain Descriptors / Indicators: Aching;Guarding Pain Intervention(s): Limited activity within patient's tolerance;Monitored during session;Repositioned    Home Living Family/patient expects to be discharged to:: Private residence Living Arrangements: Alone Available Help at Discharge: Family Type of Home: House Home Access: Level entry     Home Layout: One level Home Equipment: Environmental consultant - 2 wheels;Bedside commode;Tub bench      Prior Function Level of Independence: Independent         Comments: Indep with ADLs, household and community mobility; + driving.     Hand Dominance        Extremity/Trunk Assessment   Upper Extremity Assessment Upper Extremity Assessment: Overall WFL for tasks assessed  Lower Extremity Assessment Lower Extremity Assessment: Overall WFL for tasks assessed (grossly at least 4 to 4+/5 throughout bila tLEs)       Communication   Communication: No difficulties  Cognition Arousal/Alertness: Awake/alert Behavior During Therapy: WFL for tasks assessed/performed Overall Cognitive Status: Within Functional Limits for  tasks assessed                      General Comments      Exercises Other Exercises Other Exercises: 150' with RW, cga/close sup--improved step height/length, overall fluidity and gait symmetry; improved weight acceptance to L LE and greater confidence with movement.  Do recommend continued use of RW upon discharge; patient voiced understanding (has RW at home) Other Exercises: 5x sit/stand without assist device, close sup, 10 seconds with fair/good LE strength and power   Assessment/Plan    PT Assessment Patient needs continued PT services  PT Problem List Decreased balance;Decreased mobility;Pain          PT Treatment Interventions DME instruction;Gait training;Stair training;Functional mobility training;Therapeutic activities;Therapeutic exercise;Patient/family education    PT Goals (Current goals can be found in the Care Plan section)  Acute Rehab PT Goals Patient Stated Goal: to return home PT Goal Formulation: With patient Time For Goal Achievement: 04/12/16 Potential to Achieve Goals: Good    Frequency Min 2X/week   Barriers to discharge        Co-evaluation               End of Session Equipment Utilized During Treatment: Gait belt Activity Tolerance: Patient tolerated treatment well Patient left: in chair;with call bell/phone within reach (nurse at bedside to prepare for transfer)           Time: 1130-1156 PT Time Calculation (min) (ACUTE ONLY): 26 min   Charges:   PT Evaluation $PT Eval Low Complexity: 1 Procedure PT Treatments $Gait Training: 8-22 mins   PT G Codes:       Treatment session lead by [employer], SPT; documented concurrently by Tera Helper, [place]. Owens Shark, PT, DPT, NCS 03/29/16, 2:14 PM [phone]

## 2016-03-29 NOTE — Progress Notes (Signed)
1230 Patient transported to 209 via wheelchair

## 2016-03-29 NOTE — ED Notes (Signed)
Called Lab, spoke with [place] about obtain the patient's blood cultures. Wendi Maya verbalized someone from lab would come to obtain the specimens

## 2016-03-30 LAB — BLOOD CULTURE ID PANEL (REFLEXED)
ACINETOBACTER BAUMANNII: NOT DETECTED
CANDIDA GLABRATA: NOT DETECTED
CANDIDA KRUSEI: NOT DETECTED
CANDIDA PARAPSILOSIS: NOT DETECTED
CANDIDA TROPICALIS: NOT DETECTED
Candida albicans: NOT DETECTED
ENTEROBACTER CLOACAE COMPLEX: NOT DETECTED
ENTEROBACTERIACEAE SPECIES: NOT DETECTED
ENTEROCOCCUS SPECIES: NOT DETECTED
ESCHERICHIA COLI: NOT DETECTED
Haemophilus influenzae: NOT DETECTED
KLEBSIELLA OXYTOCA: NOT DETECTED
KLEBSIELLA PNEUMONIAE: NOT DETECTED
LISTERIA MONOCYTOGENES: NOT DETECTED
Neisseria meningitidis: NOT DETECTED
Proteus species: NOT DETECTED
Pseudomonas aeruginosa: NOT DETECTED
STREPTOCOCCUS PYOGENES: NOT DETECTED
STREPTOCOCCUS SPECIES: NOT DETECTED
Serratia marcescens: NOT DETECTED
Staphylococcus aureus (BCID): NOT DETECTED
Staphylococcus species: NOT DETECTED
Streptococcus agalactiae: NOT DETECTED
Streptococcus pneumoniae: NOT DETECTED

## 2016-03-30 LAB — URINE CULTURE

## 2016-03-30 NOTE — Progress Notes (Signed)
PHARMACY - PHYSICIAN COMMUNICATION CRITICAL VALUE ALERT - BLOOD CULTURE IDENTIFICATION (BCID)  No results found for this or any previous visit.  Lab reports anaerobic bottle of 1 set showing [place] did not detect. Paged provider x 1.  Name of physician (or Provider) Contacted: Marda Stalker  Changes to prescribed antibiotics required: None communicated  [employer], Pharm.D., BCPS Clinical Pharmacist 03/30/2016  5:22 AM

## 2016-04-03 LAB — CULTURE, BLOOD (ROUTINE X 2): CULTURE: NO GROWTH

## 2016-04-05 LAB — CULTURE, BLOOD (ROUTINE X 2)

## 2016-05-27 ENCOUNTER — Emergency Department: Payer: Self-pay

## 2016-05-27 ENCOUNTER — Emergency Department
Admission: EM | Admit: 2016-05-27 | Discharge: 2016-05-27 | Disposition: A | Discharge status: Home / Self Care | Payer: Self-pay | Attending: Emergency Medicine | Admitting: Emergency Medicine

## 2016-05-27 DIAGNOSIS — R601 Generalized edema: Secondary | ICD-10-CM | POA: Insufficient documentation

## 2016-05-27 DIAGNOSIS — K709 Alcoholic liver disease, unspecified: Secondary | ICD-10-CM | POA: Insufficient documentation

## 2016-05-27 DIAGNOSIS — Z79899 Other long term (current) drug therapy: Secondary | ICD-10-CM | POA: Insufficient documentation

## 2016-05-27 DIAGNOSIS — D649 Anemia, unspecified: Secondary | ICD-10-CM | POA: Insufficient documentation

## 2016-05-27 DIAGNOSIS — R109 Unspecified abdominal pain: Secondary | ICD-10-CM

## 2016-05-27 DIAGNOSIS — F17200 Nicotine dependence, unspecified, uncomplicated: Secondary | ICD-10-CM | POA: Insufficient documentation

## 2016-05-27 DIAGNOSIS — Z7982 Long term (current) use of aspirin: Secondary | ICD-10-CM | POA: Insufficient documentation

## 2016-05-27 LAB — CBC
HCT: 32.8 % — ABNORMAL LOW (ref 35.0–47.0)
HEMOGLOBIN: 10.9 g/dL — AB (ref 12.0–16.0)
MCH: 39.8 pg — ABNORMAL HIGH (ref 26.0–34.0)
MCHC: 33.2 g/dL (ref 32.0–36.0)
MCV: 119.8 fL — ABNORMAL HIGH (ref 80.0–100.0)
Platelets: 379 10*3/uL (ref 150–440)
RBC: 2.74 MIL/uL — ABNORMAL LOW (ref 3.80–5.20)
RDW: 18 % — ABNORMAL HIGH (ref 11.5–14.5)
WBC: 5 10*3/uL (ref 3.6–11.0)

## 2016-05-27 LAB — COMPREHENSIVE METABOLIC PANEL
ALBUMIN: 1.8 g/dL — AB (ref 3.5–5.0)
ALK PHOS: 185 U/L — AB (ref 38–126)
ALT: 31 U/L (ref 14–54)
ANION GAP: 9 (ref 5–15)
AST: 59 U/L — ABNORMAL HIGH (ref 15–41)
BILIRUBIN TOTAL: 1.3 mg/dL — AB (ref 0.3–1.2)
BUN: <5 mg/dL — ABNORMAL LOW (ref 6–20)
CALCIUM: 7.9 mg/dL — AB (ref 8.9–10.3)
CO2: 19 mmol/L — ABNORMAL LOW (ref 22–32)
Chloride: 113 mmol/L — ABNORMAL HIGH (ref 101–111)
Creatinine, Ser: 0.37 mg/dL — ABNORMAL LOW (ref 0.44–1.00)
GFR calc non Af Amer: >60 mL/min (ref >60)
GLUCOSE: 68 mg/dL (ref 65–99)
Potassium: 3.3 mmol/L — ABNORMAL LOW (ref 3.5–5.1)
Sodium: 141 mmol/L (ref 135–145)
TOTAL PROTEIN: 4.5 g/dL — AB (ref 6.5–8.1)

## 2016-05-27 LAB — URINALYSIS, COMPLETE (UACMP) WITH MICROSCOPIC
Bacteria, UA: NONE SEEN
Bilirubin Urine: NEGATIVE
GLUCOSE, UA: NEGATIVE mg/dL
Hgb urine dipstick: NEGATIVE
KETONES UR: NEGATIVE mg/dL
Leukocytes, UA: NEGATIVE
NITRITE: NEGATIVE
PH: 5 (ref 5.0–8.0)
Protein, ur: NEGATIVE mg/dL
SPECIFIC GRAVITY, URINE: 1.027 (ref 1.005–1.030)

## 2016-05-27 LAB — ETHANOL: Alcohol, Ethyl (B): 105 mg/dL — ABNORMAL HIGH (ref <5)

## 2016-05-27 LAB — LIPASE, BLOOD: Lipase: 18 U/L (ref 11–51)

## 2016-05-27 MED ORDER — FUROSEMIDE 20 MG PO TABS: 20.0000 mg | ORAL_TABLET | Freq: Every day | ORAL | 11 refills | Status: DC

## 2016-05-27 MED ORDER — SPIRONOLACTONE 50 MG PO TABS: 50.0000 mg | ORAL_TABLET | Freq: Every day | ORAL | 11 refills | Status: DC

## 2016-05-27 MED ORDER — ONDANSETRON HCL 4 MG/2ML IJ SOLN
4.0000 mg | Freq: Once | INTRAMUSCULAR | Status: AC
  Administered 2016-05-27: 4 mg via INTRAVENOUS
  Filled 2016-05-27: qty 2

## 2016-05-27 NOTE — ED Triage Notes (Signed)
Pt reports swelling bilaterally in legs. Pt reports right leg swelling that started Sunday and left leg swelling couple days ago. Pt also reports abdominal pain. Pt reports N/V and diarrhea. Last vomited this am.

## 2016-05-27 NOTE — ED Notes (Signed)
ED Provider at bedside. 

## 2016-05-27 NOTE — ED Provider Notes (Signed)
[facility] Emergency Department Provider Note       Time seen: ----------------------------------------- 1:47 PM on 05/27/2016 -----------------------------------------     I have reviewed the triage vital signs and the nursing notes.   HISTORY   Chief Complaint Leg Pain and Abdominal Pain    HPI Claire Brown is a 56 y.o. female who presents to the ED for bilateral leg swelling. Patient reports right leg swelling started Sunday and left leg swelling that started a couple days ago. Patient also reports abdominal pain with nausea, vomiting and diarrhea. Patient last vomited this morning. Pain is currently 7 out of 10 in the legs that she describes as aching or shooting   Past Medical History:  Diagnosis Date  . Anemia   . Anxiety   . Arthritis   . Bipolar disorder ([place])   . Headache     Patient Active Problem List   Diagnosis Date Noted  . Shock ([place]) 03/29/2016  . S/P laparoscopic hysterectomy 10/14/2015  . Ovarian mass, right 09/24/2015    Past Surgical History:  Procedure Laterality Date  . CESAREAN SECTION    . CYSTOSCOPY N/A 10/14/2015   Procedure: CYSTOSCOPY;  Surgeon: Malachy Mood, MD;  Location: [facility] ORS;  Service: Gynecology;  Laterality: N/A;  . KNEE SURGERY Bilateral   . LAPAROSCOPIC BILATERAL SALPINGO OOPHERECTOMY  10/14/2015   Procedure: LAPAROSCOPIC BILATERAL SALPINGO OOPHORECTOMY;  Surgeon: Malachy Mood, MD;  Location: [facility] ORS;  Service: Gynecology;;  . LAPAROSCOPIC HYSTERECTOMY N/A 10/14/2015   Procedure: HYSTERECTOMY TOTAL LAPAROSCOPIC;  Surgeon: Malachy Mood, MD;  Location: [facility] ORS;  Service: Gynecology;  Laterality: N/A;    Allergies Augmentin [amoxicillin-pot clavulanate]; Penicillins; Oxycodone; and Valium [diazepam]  Social History Social History  Substance Use Topics  . Smoking status: Current Every Day Smoker    Packs/day: 1.00  . Smokeless tobacco: Never Used  . Alcohol use Yes     Comment:  OCC    Review of Systems Constitutional: Negative for fever. Cardiovascular: Negative for chest pain. Respiratory: Negative for shortness of breath. Gastrointestinal: Positive for abdominal pain, vomiting and diarrhea Genitourinary: Negative for dysuria. Musculoskeletal: Positive for bilateral leg pain Skin: Negative for rash. Neurological: Negative for headaches, focal weakness or numbness.  10-point ROS otherwise negative.  ____________________________________________   PHYSICAL EXAM:  VITAL SIGNS: ED Triage Vitals  Enc Vitals Group     BP 05/27/16 1204 90/65     Pulse Rate 05/27/16 1204 88     Resp 05/27/16 1204 20     Temp 05/27/16 1204 97.9 F (36.6 C)     Temp Source 05/27/16 1204 Oral     SpO2 05/27/16 1204 100 %     Weight 05/27/16 1205 111 lb (50.3 kg)     Height 05/27/16 1205 5' (1.524 m)     Head Circumference --      Peak Flow --      Pain Score 05/27/16 1207 7     Pain Loc --      Pain Edu? --      Excl. in [place]? --     Constitutional: Alert and oriented. Well appearing and in no distress. Eyes: Conjunctivae are pale,  PERRL. Normal extraocular movements. ENT   Head: Normocephalic and atraumatic.   Nose: No congestion/rhinnorhea.   Mouth/Throat: Mucous membranes are moist.   Neck: No stridor. Cardiovascular: Normal rate, regular rhythm. No murmurs, rubs, or gallops. Respiratory: Normal respiratory effort without tachypnea nor retractions. Breath sounds are clear and equal bilaterally. No wheezes/rales/rhonchi.  Gastrointestinal: Nonfocal abdominal tenderness. Rectal: Soft external hemorrhoids, heme-negative Musculoskeletal: Nontender with normal range of motion in extremities. Bilateral lower extremity pitting edema is noted Neurologic:  Normal speech and language. No gross focal neurologic deficits are appreciated.  Skin:  Excoriations are noted Psychiatric: Mood and affect are normal. Speech and behavior are normal.   ____________________________________________  ED COURSE:  Pertinent labs & imaging results that were available during my care of the patient were reviewed by me and considered in my medical decision making (see chart for details). Patient presents for Abdominal pain, edema, we will assess with labs and imaging as indicated. Clinical Course as of May 28 1534  Thu May 27, 2016  1520 Alcohol, Ethyl (B): (!) 105 [JW]    Clinical Course User Index [JW] Earleen Newport, MD   Procedures ____________________________________________   LABS (pertinent positives/negatives)  Labs Reviewed  COMPREHENSIVE METABOLIC PANEL - Abnormal; Notable for the following:       Result Value   Potassium 3.3 (*)    Chloride 113 (*)    CO2 19 (*)    BUN <5 (*)    Creatinine, Ser 0.37 (*)    Calcium 7.9 (*)    Total Protein 4.5 (*)    Albumin 1.8 (*)    AST 59 (*)    Alkaline Phosphatase 185 (*)    Total Bilirubin 1.3 (*)    All other components within normal limits  CBC - Abnormal; Notable for the following:    RBC 2.74 (*)    Hemoglobin 10.9 (*)    HCT 32.8 (*)    MCV 119.8 (*)    MCH 39.8 (*)    RDW 18.0 (*)    All other components within normal limits  URINALYSIS, COMPLETE (UACMP) WITH MICROSCOPIC - Abnormal; Notable for the following:    Color, Urine AMBER (*)    APPearance HAZY (*)    Squamous Epithelial / LPF 0-5 (*)    All other components within normal limits  ETHANOL - Abnormal; Notable for the following:    Alcohol, Ethyl (B) 105 (*)    All other components within normal limits  LIPASE, BLOOD    RADIOLOGY Images were viewed by me  Abdominal ultrasound IMPRESSION: 1. No gallstones are noted within gallbladder. No sonographic Murphy's sign. 2. There is mild dilatation of distal CBD up to 8.8 mm. 3. Diffuse increased echogenicity of the liver consistent with fatty infiltration. No focal hepatic mass. ____________________________________________  FINAL ASSESSMENT AND  PLAN  Abdominal pain, edema, anemia, Chronic alcoholism  Plan: Patient's labs and imaging were dictated above. Patient had presented for symptoms most indicative of chronic liver disease. She does have a history of alcoholism and is currently intoxicated. We will start her on 50 mg of spironolactone and 20 mg of Lasix. She will need close outpatient follow-up with GI and will be referred to same.   Earleen Newport, MD   Note: This note was generated in part or whole with voice recognition software. Voice recognition is usually quite accurate but there are transcription errors that can and very often do occur. I apologize for any typographical errors that were not detected and corrected.     Earleen Newport, MD 05/27/16 1537

## 2016-05-27 NOTE — ED Notes (Signed)
Pt ambulating around room. Alert, oriented. No distress noted.

## 2016-05-27 NOTE — ED Notes (Addendum)
Dr. Jimmye Norman performing a rectal exam. Negative for blood in stool. Pt has hemorrhoids.

## 2016-05-27 NOTE — ED Notes (Signed)
Pt sitting on side of bed, hooked to pulse ox and BP cuff. No distress noted.

## 2016-05-27 NOTE — ED Notes (Signed)
Pt taken to US via wheelchair

## 2017-01-27 ENCOUNTER — Other Ambulatory Visit: Payer: Self-pay

## 2017-01-27 ENCOUNTER — Encounter: Payer: Self-pay | Admitting: Emergency Medicine

## 2017-01-27 ENCOUNTER — Emergency Department
Admission: EM | Admit: 2017-01-27 | Discharge: 2017-01-27 | Disposition: A | Discharge status: Home / Self Care | Payer: Self-pay | Attending: Emergency Medicine | Admitting: Emergency Medicine

## 2017-01-27 ENCOUNTER — Emergency Department: Payer: Self-pay

## 2017-01-27 DIAGNOSIS — Y939 Activity, unspecified: Secondary | ICD-10-CM | POA: Insufficient documentation

## 2017-01-27 DIAGNOSIS — W009XXA Unspecified fall due to ice and snow, initial encounter: Secondary | ICD-10-CM | POA: Insufficient documentation

## 2017-01-27 DIAGNOSIS — Y929 Unspecified place or not applicable: Secondary | ICD-10-CM | POA: Insufficient documentation

## 2017-01-27 DIAGNOSIS — M87052 Idiopathic aseptic necrosis of left femur: Secondary | ICD-10-CM | POA: Insufficient documentation

## 2017-01-27 DIAGNOSIS — F17210 Nicotine dependence, cigarettes, uncomplicated: Secondary | ICD-10-CM | POA: Insufficient documentation

## 2017-01-27 DIAGNOSIS — S8012XA Contusion of left lower leg, initial encounter: Secondary | ICD-10-CM | POA: Insufficient documentation

## 2017-01-27 DIAGNOSIS — Y999 Unspecified external cause status: Secondary | ICD-10-CM | POA: Insufficient documentation

## 2017-01-27 DIAGNOSIS — S7002XA Contusion of left hip, initial encounter: Secondary | ICD-10-CM | POA: Insufficient documentation

## 2017-01-27 DIAGNOSIS — Z7982 Long term (current) use of aspirin: Secondary | ICD-10-CM | POA: Insufficient documentation

## 2017-01-27 DIAGNOSIS — Z79899 Other long term (current) drug therapy: Secondary | ICD-10-CM | POA: Insufficient documentation

## 2017-01-27 MED ORDER — TRAMADOL HCL 50 MG PO TABS: 50.0000 mg | ORAL_TABLET | Freq: Four times a day (QID) | ORAL | 0 refills | Status: AC | PRN

## 2017-01-27 NOTE — ED Triage Notes (Signed)
Presents s/p fall  Having pain to left hip,leg and ankle area

## 2017-01-27 NOTE — Discharge Instructions (Signed)
Follow-up with the orthopedist. Call and make an appointment.  Lower left hip shows changes suggestive of avascular necrosis and your orthopedist should know that. Ice and elevation as needed for pain. Also take tramadol 1 every 6 hours as needed for pain.

## 2017-01-27 NOTE — ED Provider Notes (Signed)
[facility] Emergency Department Provider Note  ____________________________________________   First MD Initiated Contact with Patient 01/27/17 1354     (approximate)  I have reviewed the triage vital signs and the nursing notes.   HISTORY  Chief Complaint Fall   HPI Claire Brown is a 56 y.o. female is here complaining of left hip, leg and ankle pain after falling on ice 2 days ago.  Patient denies any head injury or loss of consciousness. There is been no nausea vomiting. No visual changes. Patient has continued to be ambulatory but has been using her walker that she had when she had surgery on her left hip. She states her orthopedist is in [place].   Past Medical History:  Diagnosis Date  . Anemia   . Anxiety   . Arthritis   . Bipolar disorder ([place])   . Headache     Patient Active Problem List   Diagnosis Date Noted  . Shock ([place]) 03/29/2016  . S/P laparoscopic hysterectomy 10/14/2015  . Ovarian mass, right 09/24/2015    Past Surgical History:  Procedure Laterality Date  . CESAREAN SECTION    . CYSTOSCOPY N/A 10/14/2015   Procedure: CYSTOSCOPY;  Surgeon: Malachy Mood, MD;  Location: [facility] ORS;  Service: Gynecology;  Laterality: N/A;  . KNEE SURGERY Bilateral   . LAPAROSCOPIC BILATERAL SALPINGO OOPHERECTOMY  10/14/2015   Procedure: LAPAROSCOPIC BILATERAL SALPINGO OOPHORECTOMY;  Surgeon: Malachy Mood, MD;  Location: [facility] ORS;  Service: Gynecology;;  . LAPAROSCOPIC HYSTERECTOMY N/A 10/14/2015   Procedure: HYSTERECTOMY TOTAL LAPAROSCOPIC;  Surgeon: Malachy Mood, MD;  Location: [facility] ORS;  Service: Gynecology;  Laterality: N/A;    Prior to Admission medications   Medication Sig Start Date End Date Taking? Authorizing Provider  aspirin 325 MG tablet Take 325 mg by mouth daily.    [provider]  Calcium Carbonate-Vitamin D 600-400 MG-UNIT tablet Take 1 tablet by mouth 2 (two) times daily.    [provider]    diphenhydrAMINE (BENADRYL) 25 MG tablet Take 25 mg by mouth every 6 (six) hours as needed for allergies.    [provider]  ferrous gluconate (FERGON) 324 MG tablet Take 324 mg by mouth daily.    [provider]  furosemide (LASIX) 20 MG tablet Take 1 tablet (20 mg total) by mouth daily. 05/27/16 05/27/17  Earleen Newport, MD  Multiple Vitamin (MULTIVITAMIN WITH MINERALS) TABS tablet Take 1 tablet by mouth daily.    [provider]  traMADol (ULTRAM) 50 MG tablet Take 1 tablet (50 mg total) by mouth every 6 (six) hours as needed for moderate pain. 01/27/17 01/27/18  Johnn Hai, PA-C  Vitamin D, Ergocalciferol, (DRISDOL) 50000 units CAPS capsule Take 1 capsule by mouth once a week. Patient takes on Saturday 01/17/16   [provider]    Allergies Augmentin [amoxicillin-pot clavulanate]; Penicillins; Oxycodone; and Valium [diazepam]  Family History  Problem Relation Age of Onset  . Cancer Brother        loyphoma    Social History Social History   Tobacco Use  . Smoking status: Current Every Day Smoker    Packs/day: 1.00  . Smokeless tobacco: Never Used  Substance Use Topics  . Alcohol use: Yes    Comment: OCC  . Drug use: No    Review of Systems Constitutional: No fever/chills Eyes: No visual changes. ENT: no injury Cardiovascular: Denies chest pain. Respiratory: Denies shortness of breath. Gastrointestinal: No abdominal pain.  No nausea, no vomiting.  Musculoskeletal: Negative for back pain. Positive for left hip and left lower leg pain. Skin: Negative for laceration. Neurological: Negative for headaches, focal weakness or numbness. ____________________________________________   PHYSICAL EXAM:  VITAL SIGNS: ED Triage Vitals  Enc Vitals Group     BP 01/27/17 1243 123/74     Pulse Rate 01/27/17 1243 91     Resp 01/27/17 1243 17     Temp 01/27/17 1243 98.4 F (36.9 C)     Temp Source 01/27/17 1243 Oral     SpO2  01/27/17 1243 92 %     Weight 01/27/17 1244 117 lb (53.1 kg)     Height 01/27/17 1244 5' (1.524 m)     Head Circumference --      Peak Flow --      Pain Score 01/27/17 1225 6     Pain Loc --      Pain Edu? --      Excl. in [place]? --    Constitutional: Alert and oriented. Well appearing and in no acute distress. Eyes: Conjunctivae are normal. PERRL. EOMI. Head: Atraumatic. Nose: no trauma. Neck: No stridor.  No cervical tenderness on palpation posteriorly. Cardiovascular: Normal rate, regular rhythm. Grossly normal heart sounds.  Good peripheral circulation. Respiratory: Normal respiratory effort.  No retractions. Lungs CTAB. Gastrointestinal: Soft and nontender. No distention. Musculoskeletal: on examination of the left hip there is some tenderness on palpation anteriorly. No gross deformity is noted. Range of motion is slightly restricted secondary to discomfort.No soft tissue swelling is present. There is also tenderness around the patella without soft tissue swelling or effusion present. Range of motion is without restriction. There is some superficial abrasions to the anterior tib-fib midshaft area without soft tissue swelling.Area is tender to touch. Nontender on examination of the left ankle. No soft tissue swelling present in range of motion is within normal limits. No rotation or shortening of the limb is noted. Neurologic:  Normal speech and language. No gross focal neurologic deficits are appreciated.  Skin:  Skin is warm, dry and intact. No rash noted. Psychiatric: Mood and affect are normal. Speech and behavior are normal.  ____________________________________________   LABS (all labs ordered are listed, but only abnormal results are displayed)  Labs Reviewed - No data to display  RADIOLOGY  Dg Tibia/fibula Left  Result Date: 01/27/2017 CLINICAL DATA:  Left lower leg pain after fall 2 days ago. EXAM: LEFT TIBIA AND FIBULA - 2 VIEW COMPARISON:  None. FINDINGS: There is no  evidence of fracture or other focal bone lesions. Soft tissues are unremarkable. IMPRESSION: Normal left tibia and fibula. Electronically Signed   By: Marijo Conception, M.D.   On: 01/27/2017 14:42   Dg Hip Unilat W Or Wo Pelvis 2-3 Views Left  Result Date: 01/27/2017 CLINICAL DATA:  Left hip pain after fall. EXAM: DG HIP (WITH OR WITHOUT PELVIS) 2-3V LEFT COMPARISON:  None. FINDINGS: Status post surgical pinning of old proximal left femoral fracture. No acute fracture or dislocation is noted. No significant degenerative changes seen involving either hip joint. Possible sclerosis is seen in right femoral head. IMPRESSION: Postsurgical changes involving the left hip. No other abnormality seen in the left hip. Sclerosis seen projected over right femoral head suggesting avascular necrosis. Electronically Signed   By: Marijo Conception, M.D.   On: 01/27/2017 14:41    ____________________________________________   PROCEDURES  Procedure(s) performed: None  Procedures  Critical Care performed: No  ____________________________________________   INITIAL IMPRESSION / ASSESSMENT AND PLAN /  ED COURSE Results of x-rays were discussed with patient. She is unaware that she has any avascular necrosis of her left hip. She will call and make an appointment with her orthopedist in [place]  to be seen in follow-up for this.  Patient is to apply ice to the areas needed for pain. She is given a prescription for tramadol 1 every 6 hours as needed for pain. ____________________________________________   FINAL CLINICAL IMPRESSION(S) / ED DIAGNOSES  Final diagnoses:  Contusion of left hip, initial encounter  Contusion of left tibia  Avascular necrosis of left femoral head [facility])     ED Discharge Orders        Ordered    traMADol (ULTRAM) 50 MG tablet  Every 6 hours PRN     01/27/17 1502       Note:  This document was prepared using Dragon voice recognition software and may include unintentional  dictation errors.    Johnn Hai, PA-C 01/27/17 1545    Eula Listen, MD 01/28/17 [phone]

## 2017-01-27 NOTE — ED Notes (Signed)
Pt states fell on tues and has hip and shin pain with hx of hip, femur fx with repair

## 2017-05-11 ENCOUNTER — Emergency Department (HOSPITAL_COMMUNITY)
Admission: EM | Admit: 2017-05-11 | Discharge: 2017-05-11 | Disposition: A | Discharge status: Home / Self Care | Payer: Self-pay | Attending: Emergency Medicine | Admitting: Emergency Medicine

## 2017-05-11 ENCOUNTER — Emergency Department
Admission: EM | Admit: 2017-05-11 | Discharge: 2017-05-11 | Disposition: A | Discharge status: Home / Self Care | Payer: Self-pay | Attending: Emergency Medicine | Admitting: Emergency Medicine

## 2017-05-11 ENCOUNTER — Encounter (HOSPITAL_COMMUNITY): Payer: Self-pay | Admitting: *Deleted

## 2017-05-11 ENCOUNTER — Emergency Department (HOSPITAL_COMMUNITY): Payer: Self-pay

## 2017-05-11 ENCOUNTER — Other Ambulatory Visit: Payer: Self-pay

## 2017-05-11 DIAGNOSIS — F17210 Nicotine dependence, cigarettes, uncomplicated: Secondary | ICD-10-CM | POA: Insufficient documentation

## 2017-05-11 DIAGNOSIS — Z79899 Other long term (current) drug therapy: Secondary | ICD-10-CM | POA: Insufficient documentation

## 2017-05-11 DIAGNOSIS — Z5321 Procedure and treatment not carried out due to patient leaving prior to being seen by health care provider: Secondary | ICD-10-CM | POA: Insufficient documentation

## 2017-05-11 DIAGNOSIS — T792XXA Traumatic secondary and recurrent hemorrhage and seroma, initial encounter: Secondary | ICD-10-CM | POA: Insufficient documentation

## 2017-05-11 DIAGNOSIS — Z7982 Long term (current) use of aspirin: Secondary | ICD-10-CM | POA: Insufficient documentation

## 2017-05-11 DIAGNOSIS — W1830XA Fall on same level, unspecified, initial encounter: Secondary | ICD-10-CM | POA: Insufficient documentation

## 2017-05-11 DIAGNOSIS — Y92009 Unspecified place in unspecified non-institutional (private) residence as the place of occurrence of the external cause: Secondary | ICD-10-CM | POA: Insufficient documentation

## 2017-05-11 DIAGNOSIS — M25552 Pain in left hip: Secondary | ICD-10-CM | POA: Insufficient documentation

## 2017-05-11 DIAGNOSIS — Y999 Unspecified external cause status: Secondary | ICD-10-CM | POA: Insufficient documentation

## 2017-05-11 DIAGNOSIS — W19XXXA Unspecified fall, initial encounter: Secondary | ICD-10-CM

## 2017-05-11 DIAGNOSIS — Y939 Activity, unspecified: Secondary | ICD-10-CM | POA: Insufficient documentation

## 2017-05-11 NOTE — ED Provider Notes (Signed)
[facility] Emergency Department Provider Note ____________________________________________  Time seen: 1210  I have reviewed the triage vital signs and the nursing notes.  HISTORY  Chief Complaint  Hip Pain  HPI Claire Brown is a 57 y.o. female presents to the ED accompanied by family, for evaluation of left hip pain and swelling.  Patient describes a mechanical fall that occurred at home about 1 AM this morning.  She is 1-1/2 years status post left femoral neck fracture with pin fixture.  She was primarily concerned about disruption to her previous hip injury.  She was transported via EMS from home to [facility], ED.  She describes there she was taken back directly to have a x-ray images performed of her hip.  She was then placed back in the lobby and waiting around.  She describes she ultimately left after a protracted weight of more than 7 hours.  She presents here from home via personal vehicle for evaluation of her hip injury.  She denies any significant pain to the hip and notes she is used her single-point cane since the injury patient denies any head injury, loss of consciousness, chest pain, or shortness of breath.  She also notes some significant swelling to her left buttocks that is only been present since the fall.  Past Medical History:  Diagnosis Date  . Anemia   . Anxiety   . Arthritis   . Bipolar disorder ([place])   . Headache     Patient Active Problem List   Diagnosis Date Noted  . Shock ([place]) 03/29/2016  . S/P laparoscopic hysterectomy 10/14/2015  . Ovarian mass, right 09/24/2015    Past Surgical History:  Procedure Laterality Date  . CESAREAN SECTION    . CYSTOSCOPY N/A 10/14/2015   Procedure: CYSTOSCOPY;  Surgeon: Malachy Mood, MD;  Location: [facility] ORS;  Service: Gynecology;  Laterality: N/A;  . FRACTURE SURGERY Left 12/2015   Femoral Neck Pin Placement - Dr. Mariella Saa [facility])  . KNEE SURGERY Bilateral   . LAPAROSCOPIC  BILATERAL SALPINGO OOPHERECTOMY  10/14/2015   Procedure: LAPAROSCOPIC BILATERAL SALPINGO OOPHORECTOMY;  Surgeon: Malachy Mood, MD;  Location: [facility] ORS;  Service: Gynecology;;  . LAPAROSCOPIC HYSTERECTOMY N/A 10/14/2015   Procedure: HYSTERECTOMY TOTAL LAPAROSCOPIC;  Surgeon: Malachy Mood, MD;  Location: [facility] ORS;  Service: Gynecology;  Laterality: N/A;    Prior to Admission medications   Medication Sig Start Date End Date Taking? Authorizing Provider  aspirin 325 MG tablet Take 325 mg by mouth daily.    [provider]  Calcium Carbonate-Vitamin D 600-400 MG-UNIT tablet Take 1 tablet by mouth 2 (two) times daily.    [provider]  diphenhydrAMINE (BENADRYL) 25 MG tablet Take 25 mg by mouth every 6 (six) hours as needed for allergies.    [provider]  ferrous gluconate (FERGON) 324 MG tablet Take 324 mg by mouth daily.    [provider]  furosemide (LASIX) 20 MG tablet Take 1 tablet (20 mg total) by mouth daily. 05/27/16 05/27/17  Earleen Newport, MD  Multiple Vitamin (MULTIVITAMIN WITH MINERALS) TABS tablet Take 1 tablet by mouth daily.    [provider]  traMADol (ULTRAM) 50 MG tablet Take 1 tablet (50 mg total) by mouth every 6 (six) hours as needed for moderate pain. 01/27/17 01/27/18  Johnn Hai, PA-C  Vitamin D, Ergocalciferol, (DRISDOL) 50000 units CAPS capsule Take 1 capsule by mouth once a week. Patient takes on Saturday 01/17/16   [provider]  Allergies Augmentin [amoxicillin-pot clavulanate]; Penicillins; Oxycodone; and Valium [diazepam]  Family History  Problem Relation Age of Onset  . Cancer Brother        loyphoma    Social History Social History   Tobacco Use  . Smoking status: Current Every Day Smoker    Packs/day: 1.00  . Smokeless tobacco: Never Used  Substance Use Topics  . Alcohol use: Yes    Comment: OCC  . Drug use: No    Review of Systems  Constitutional: Negative for  fever. Eyes: Negative for visual changes. ENT: Negative for sore throat. Cardiovascular: Negative for chest pain. Respiratory: Negative for shortness of breath. Gastrointestinal: Negative for abdominal pain, vomiting and diarrhea. Genitourinary: Negative for dysuria. Musculoskeletal: Negative for back pain. Left hip pain Skin: Negative for rash. Neurological: Negative for headaches, focal weakness or numbness. ____________________________________________  PHYSICAL EXAM:  VITAL SIGNS: ED Triage Vitals  Enc Vitals Group     BP 05/11/17 1120 109/71     Pulse Rate 05/11/17 1120 85     Resp 05/11/17 1120 18     Temp 05/11/17 1120 98.1 F (36.7 C)     Temp Source 05/11/17 1120 Oral     SpO2 05/11/17 1120 100 %     Weight 05/11/17 1121 117 lb (53.1 kg)     Height 05/11/17 1121 5' (1.524 m)     Head Circumference --      Peak Flow --      Pain Score 05/11/17 1120 5     Pain Loc --      Pain Edu? --      Excl. in [place]? --     Constitutional: Alert and oriented. Well appearing and in no distress. Head: Normocephalic and atraumatic. Eyes: Conjunctivae are normal. Normal extraocular movements Cardiovascular: Normal rate, regular rhythm. Normal distal pulses. Respiratory: Normal respiratory effort. No wheezes/rales/rhonchi. Musculoskeletal: Left hip without obvious deformity or leg length discrepancy. Normal, active, full hip flexion and extension without laxity, crepitus, or hesitation. Left buttock with a large, firm, round, nontender, cystic formation consistent with probable traumatic seroma. No overlying erythema, warmth, or induration.  Nontender with normal range of motion in all other extremities.  Neurologic:  Normal gait without ataxia. Normal speech and language. No gross focal neurologic deficits are appreciated. Skin:  Skin is warm, dry and intact. No rash noted. Psychiatric: Mood and affect are normal. Patient exhibits appropriate insight and  judgment. ____________________________________________   RADIOLOGY  Left Hip w/ Pelvis IMPRESSION: No acute fracture or dislocation identified.  I, Jazmarie Biever, Dannielle Karvonen, personally viewed and evaluated these images (plain radiographs) as part of my medical decision making, as well as reviewing the written report by the radiologist. ____________________________________________  INITIAL IMPRESSION / [place] / ED COURSE  With ED evaluation of injury sustained following a mechanical fall.  Patient's concern was that she had disruption of previous left hip fracture with pin fixation.  She and her family are extremely pleased with the negative x-ray results.  She does have a large posttraumatic seroma to the left buttocks.  Patient otherwise is advised that this is self-limited and will likely resolve without intervention.  She is advised however that if last longer than she expects she should follow-up with Dr. Mariella Saa at [facility] for further management. ____________________________________________  FINAL CLINICAL IMPRESSION(S) / ED DIAGNOSES  Final diagnoses:  Fall, initial encounter  Left hip pain  Post-traumatic seroma ([place])      Dajahnae Vondra, Dannielle Karvonen, PA-C 05/11/17 1307  Earleen Newport, MD 05/11/17 1355

## 2017-05-11 NOTE — ED Triage Notes (Signed)
Pt arrived by EMS: pt was trying to get popcorn out of the microwave when her dog got excited and she fell over her dog; reports +LOC; c/o L hip pain, which she has had hip replacement on in the past. EMS gave 149mcg of fentanyl enroute.

## 2017-05-11 NOTE — ED Triage Notes (Signed)
Patient presents to the ED with left hip pain after her large dog, "clipped her" last night around 1am and caused her to fall.  Patient reports history of surgery to left hip.  Patient was seen at [facility] and waited in the waiting room for several hours before leaving and coming to this ED.  Patient is in no obvious distress at this time.

## 2017-05-11 NOTE — ED Notes (Signed)
Patient came up to nurse 1st and stated she was going to [facility] couldn't wait her any longer.

## 2017-05-11 NOTE — Discharge Instructions (Addendum)
Your x-ray is negative following your fall. You do appear to have developed a seroma from the fall. The large area of swelling should resolve on its own. If it does seem slow to resolve, follow-up with Dr. Mariella Saa for possible needle drainage.

## 2019-07-08 ENCOUNTER — Emergency Department: Payer: Self-pay

## 2019-07-08 ENCOUNTER — Encounter: Payer: Self-pay | Admitting: Emergency Medicine

## 2019-07-08 ENCOUNTER — Other Ambulatory Visit: Payer: Self-pay

## 2019-07-08 ENCOUNTER — Inpatient Hospital Stay
Admission: EM | Admit: 2019-07-08 | Discharge: 2019-07-10 | DRG: 641 | Disposition: A | Discharge status: Home / Self Care | Payer: Self-pay | Attending: Internal Medicine | Admitting: Internal Medicine

## 2019-07-08 DIAGNOSIS — Z20822 Contact with and (suspected) exposure to covid-19: Secondary | ICD-10-CM | POA: Diagnosis present

## 2019-07-08 DIAGNOSIS — R112 Nausea with vomiting, unspecified: Secondary | ICD-10-CM

## 2019-07-08 DIAGNOSIS — B9620 Unspecified Escherichia coli [E. coli] as the cause of diseases classified elsewhere: Secondary | ICD-10-CM | POA: Diagnosis present

## 2019-07-08 DIAGNOSIS — Y909 Presence of alcohol in blood, level not specified: Secondary | ICD-10-CM | POA: Diagnosis present

## 2019-07-08 DIAGNOSIS — Z79899 Other long term (current) drug therapy: Secondary | ICD-10-CM

## 2019-07-08 DIAGNOSIS — Z880 Allergy status to penicillin: Secondary | ICD-10-CM

## 2019-07-08 DIAGNOSIS — Z7982 Long term (current) use of aspirin: Secondary | ICD-10-CM

## 2019-07-08 DIAGNOSIS — F17210 Nicotine dependence, cigarettes, uncomplicated: Secondary | ICD-10-CM | POA: Diagnosis present

## 2019-07-08 DIAGNOSIS — F1010 Alcohol abuse, uncomplicated: Secondary | ICD-10-CM

## 2019-07-08 DIAGNOSIS — Z885 Allergy status to narcotic agent status: Secondary | ICD-10-CM

## 2019-07-08 DIAGNOSIS — R519 Headache, unspecified: Secondary | ICD-10-CM | POA: Diagnosis present

## 2019-07-08 DIAGNOSIS — K7581 Nonalcoholic steatohepatitis (NASH): Secondary | ICD-10-CM

## 2019-07-08 DIAGNOSIS — Z807 Family history of other malignant neoplasms of lymphoid, hematopoietic and related tissues: Secondary | ICD-10-CM

## 2019-07-08 DIAGNOSIS — N390 Urinary tract infection, site not specified: Secondary | ICD-10-CM

## 2019-07-08 DIAGNOSIS — F319 Bipolar disorder, unspecified: Secondary | ICD-10-CM | POA: Diagnosis present

## 2019-07-08 DIAGNOSIS — K7010 Alcoholic hepatitis without ascites: Secondary | ICD-10-CM | POA: Diagnosis present

## 2019-07-08 DIAGNOSIS — E872 Acidosis, unspecified: Secondary | ICD-10-CM

## 2019-07-08 DIAGNOSIS — K760 Fatty (change of) liver, not elsewhere classified: Secondary | ICD-10-CM | POA: Diagnosis present

## 2019-07-08 DIAGNOSIS — E8729 Other acidosis: Secondary | ICD-10-CM

## 2019-07-08 LAB — COMPREHENSIVE METABOLIC PANEL
ALT: 100 U/L — ABNORMAL HIGH (ref 0–44)
AST: 127 U/L — ABNORMAL HIGH (ref 15–41)
Albumin: 5.2 g/dL — ABNORMAL HIGH (ref 3.5–5.0)
Alkaline Phosphatase: 108 U/L (ref 38–126)
Anion gap: 24 — ABNORMAL HIGH (ref 5–15)
BUN: 11 mg/dL (ref 6–20)
CO2: 17 mmol/L — ABNORMAL LOW (ref 22–32)
Calcium: 9.6 mg/dL (ref 8.9–10.3)
Chloride: 99 mmol/L (ref 98–111)
Creatinine, Ser: 0.81 mg/dL (ref 0.44–1.00)
GFR calc Af Amer: >60 mL/min (ref >60)
GFR calc non Af Amer: >60 mL/min (ref >60)
Glucose, Bld: 71 mg/dL (ref 70–99)
Potassium: 4.1 mmol/L (ref 3.5–5.1)
Sodium: 140 mmol/L (ref 135–145)
Total Bilirubin: 2.1 mg/dL — ABNORMAL HIGH (ref 0.3–1.2)
Total Protein: 9 g/dL — ABNORMAL HIGH (ref 6.5–8.1)

## 2019-07-08 LAB — URINALYSIS, COMPLETE (UACMP) WITH MICROSCOPIC
Bilirubin Urine: NEGATIVE
Glucose, UA: NEGATIVE mg/dL
Ketones, ur: 80 mg/dL — AB
Leukocytes,Ua: NEGATIVE
Nitrite: POSITIVE — AB
Protein, ur: 100 mg/dL — AB
Specific Gravity, Urine: 1.019 (ref 1.005–1.030)
pH: 5 (ref 5.0–8.0)

## 2019-07-08 LAB — CBC
HCT: 45.6 % (ref 36.0–46.0)
Hemoglobin: 15.4 g/dL — ABNORMAL HIGH (ref 12.0–15.0)
MCH: 35.2 pg — ABNORMAL HIGH (ref 26.0–34.0)
MCHC: 33.8 g/dL (ref 30.0–36.0)
MCV: 104.3 fL — ABNORMAL HIGH (ref 80.0–100.0)
Platelets: 131 10*3/uL — ABNORMAL LOW (ref 150–400)
RBC: 4.37 MIL/uL (ref 3.87–5.11)
RDW: 14.8 % (ref 11.5–15.5)
WBC: 6.2 10*3/uL (ref 4.0–10.5)
nRBC: 0 % (ref 0.0–0.2)

## 2019-07-08 LAB — LACTIC ACID, PLASMA: Lactic Acid, Venous: 1.9 mmol/L (ref 0.5–1.9)

## 2019-07-08 LAB — MAGNESIUM: Magnesium: 2.1 mg/dL (ref 1.7–2.4)

## 2019-07-08 LAB — GLUCOSE, CAPILLARY
Glucose-Capillary: 120 mg/dL — ABNORMAL HIGH (ref 70–99)
Glucose-Capillary: 72 mg/dL (ref 70–99)

## 2019-07-08 MED ORDER — ONDANSETRON HCL 4 MG/2ML IJ SOLN
4.0000 mg | Freq: Once | INTRAMUSCULAR | Status: AC
  Administered 2019-07-08: 4 mg via INTRAVENOUS
  Filled 2019-07-08: qty 2

## 2019-07-08 MED ORDER — LORAZEPAM 2 MG/ML IJ SOLN
1.0000 mg | Freq: Once | INTRAMUSCULAR | Status: AC
  Administered 2019-07-09: 1 mg via INTRAVENOUS
  Filled 2019-07-08: qty 1

## 2019-07-08 MED ORDER — SODIUM CHLORIDE 0.9 % IV SOLN
1.0000 g | Freq: Once | INTRAVENOUS | Status: AC
  Administered 2019-07-08: 1 g via INTRAVENOUS
  Filled 2019-07-08: qty 10

## 2019-07-08 MED ORDER — ONDANSETRON HCL 4 MG/2ML IJ SOLN
4.0000 mg | Freq: Once | INTRAMUSCULAR | Status: AC | PRN
  Administered 2019-07-08: 4 mg via INTRAVENOUS
  Filled 2019-07-08: qty 2

## 2019-07-08 MED ORDER — LACTATED RINGERS IV BOLUS
1000.0000 mL | Freq: Once | INTRAVENOUS | Status: AC
  Administered 2019-07-08: 1000 mL via INTRAVENOUS

## 2019-07-08 NOTE — ED Notes (Signed)
Pt taken to CT.

## 2019-07-08 NOTE — ED Notes (Signed)
Pt given ice chips per her request 

## 2019-07-08 NOTE — ED Notes (Signed)
Discussed with dr Charna Archer. Orders placed.

## 2019-07-08 NOTE — ED Triage Notes (Addendum)
Pt here for headache and vomiting.  When asked if fallen recently pt admits did fall last week and hit head; stepped in hole while mowing yard.  Always has a numb feeling in left leg but 5 days ago right leg started feeling numb. Hit head on left side per pt.  No chest pain or abdominal pain. Hx of headaches in past.

## 2019-07-08 NOTE — ED Notes (Signed)
First Nurse Note: Pt to ED via [facility] from home for-Leg pain and numbness and 1 episode of vomiting. Per EMS pts CBG 74 on fire arrival- Pt was given Oral glucose- CBG rechecked at 311- rechecked again 85- CBG 70 per EMS machine upon arrival to ED- Pt given oral glucose- checked by charge RN 120  Pt is in NAD

## 2019-07-08 NOTE — ED Notes (Addendum)
Pt normally drinks a few beers a day.  Has not had a drink since yesterday.  Denies hx of DTs. Also now reporting has felt weak

## 2019-07-08 NOTE — ED Notes (Addendum)
Pt c/o continued nausea and headache (ongoing for a year). Pt reports she fell in yard and hit head last Saturday and has felt "like poop" since then and has just wanted to sleep since then. Pt reports she "couldn't keep her eyes open" yesterday, and she's also having cramping in her legs at night. Pt states she usually drinks wine on the weekends, denies drug use.

## 2019-07-08 NOTE — ED Provider Notes (Signed)
[facility] Emergency Department Provider Note   ____________________________________________   First MD Initiated Contact with Patient 07/08/19 2215     (approximate)  I have reviewed the triage vital signs and the nursing notes.   HISTORY  Chief Complaint Emesis    HPI Claire Brown is a 59 y.o. female with past medical history of bipolar disorder, anxiety, and arthritis who presents to the ED complaining of nausea and vomiting.  Patient reports that she has been dealing with nausea and vomiting from much of this week with difficulty tolerating any p.o. at home.  She denies any associated abdominal pain and has not had any diarrhea.  She also denies any fevers or urinary symptoms.  She does admit to significant alcohol consumption, drinks 4-5 beers "on most days", states that she used to drink more liquor but has not recently.  Her last drink was yesterday and she now feels somewhat shaky.  She denies any issues with alcohol withdrawal in the past and does not have any history of seizures.        Past Medical History:  Diagnosis Date  . Anemia   . Anxiety   . Arthritis   . Bipolar disorder ([place])   . Headache     Patient Active Problem List   Diagnosis Date Noted  . Nausea & vomiting 07/09/2019  . Paresthesia of both lower extremities 07/09/2019  . UTI (urinary tract infection) 07/09/2019  . Alcohol use disorder, mild, abuse 07/08/2019  . Hepatic steatosis 07/08/2019  . Alcoholic ketoacidosis [id number]  . Metabolic acidosis [id number]  . Shock ([place]) 03/29/2016  . S/P laparoscopic hysterectomy 10/14/2015  . Ovarian mass, right 09/24/2015    Past Surgical History:  Procedure Laterality Date  . CESAREAN SECTION    . CYSTOSCOPY N/A 10/14/2015   Procedure: CYSTOSCOPY;  Surgeon: Malachy Mood, MD;  Location: [facility] ORS;  Service: Gynecology;  Laterality: N/A;  . FRACTURE SURGERY Left 12/2015   Femoral Neck Pin Placement - Dr. Mariella Saa  [facility])  . KNEE SURGERY Bilateral   . LAPAROSCOPIC BILATERAL SALPINGO OOPHERECTOMY  10/14/2015   Procedure: LAPAROSCOPIC BILATERAL SALPINGO OOPHORECTOMY;  Surgeon: Malachy Mood, MD;  Location: [facility] ORS;  Service: Gynecology;;  . LAPAROSCOPIC HYSTERECTOMY N/A 10/14/2015   Procedure: HYSTERECTOMY TOTAL LAPAROSCOPIC;  Surgeon: Malachy Mood, MD;  Location: [facility] ORS;  Service: Gynecology;  Laterality: N/A;    Prior to Admission medications   Medication Sig Start Date End Date Taking? Authorizing Provider  aspirin 325 MG tablet Take 325 mg by mouth daily.    [provider]  Calcium Carbonate-Vitamin D 600-400 MG-UNIT tablet Take 1 tablet by mouth 2 (two) times daily.    [provider]  diphenhydrAMINE (BENADRYL) 25 MG tablet Take 25 mg by mouth every 6 (six) hours as needed for allergies.    [provider]  ferrous gluconate (FERGON) 324 MG tablet Take 324 mg by mouth daily.    [provider]  furosemide (LASIX) 20 MG tablet Take 1 tablet (20 mg total) by mouth daily. 05/27/16 05/27/17  Earleen Newport, MD  Multiple Vitamin (MULTIVITAMIN WITH MINERALS) TABS tablet Take 1 tablet by mouth daily.    [provider]  Vitamin D, Ergocalciferol, (DRISDOL) 50000 units CAPS capsule Take 1 capsule by mouth once a week. Patient takes on Saturday 01/17/16   [provider]    Allergies Augmentin [amoxicillin-pot clavulanate], Penicillins, Oxycodone, and Valium [diazepam]  Family History  Problem Relation Age of Onset  . Cancer  Brother        loyphoma    Social History Social History   Tobacco Use  . Smoking status: Current Every Day Smoker    Packs/day: 1.00  . Smokeless tobacco: Never Used  Substance Use Topics  . Alcohol use: Yes    Comment: OCC  . Drug use: No    Review of Systems  Constitutional: No fever/chills Eyes: No visual changes. ENT: No sore throat. Cardiovascular: Denies chest pain. Respiratory: Denies  shortness of breath. Gastrointestinal: No abdominal pain.  Positive for nausea and vomiting.  No diarrhea.  No constipation. Genitourinary: Negative for dysuria. Musculoskeletal: Negative for back pain. Skin: Negative for rash. Neurological: Negative for headaches, focal weakness or numbness.  ____________________________________________   PHYSICAL EXAM:  VITAL SIGNS: ED Triage Vitals  Enc Vitals Group     BP 07/08/19 1801 (!) 146/84     Pulse Rate 07/08/19 1801 95     Resp 07/08/19 1801 18     Temp 07/08/19 1801 98.4 F (36.9 C)     Temp Source 07/08/19 1801 Oral     SpO2 07/08/19 1801 98 %     Weight 07/08/19 1803 112 lb (50.8 kg)     Height 07/08/19 1803 5' (1.524 m)     Head Circumference --      Peak Flow --      Pain Score 07/08/19 1801 5     Pain Loc --      Pain Edu? --      Excl. in [place]? --     Constitutional: Alert and oriented.  Tremulous with tongue fasciculations noted. Eyes: Conjunctivae are normal. Head: Atraumatic. Nose: No congestion/rhinnorhea. Mouth/Throat: Mucous membranes are dry. Neck: Normal ROM Cardiovascular: Normal rate, regular rhythm. Grossly normal heart sounds. Respiratory: Normal respiratory effort.  No retractions. Lungs CTAB. Gastrointestinal: Soft and nontender. No distention. Genitourinary: deferred Musculoskeletal: No lower extremity tenderness nor edema. Neurologic:  Normal speech and language. No gross focal neurologic deficits are appreciated. Skin:  Skin is warm, dry and intact. No rash noted. Psychiatric: Mood and affect are normal. Speech and behavior are normal.  ____________________________________________   LABS (all labs ordered are listed, but only abnormal results are displayed)  Labs Reviewed  GLUCOSE, CAPILLARY - Abnormal; Notable for the following components:      Result Value   Glucose-Capillary 120 (*)    All other components within normal limits  CBC - Abnormal; Notable for the following components:    Hemoglobin 15.4 (*)    MCV 104.3 (*)    MCH 35.2 (*)    Platelets 131 (*)    All other components within normal limits  COMPREHENSIVE METABOLIC PANEL - Abnormal; Notable for the following components:   CO2 17 (*)    Total Protein 9.0 (*)    Albumin 5.2 (*)    AST 127 (*)    ALT 100 (*)    Total Bilirubin 2.1 (*)    Anion gap 24 (*)    All other components within normal limits  URINALYSIS, COMPLETE (UACMP) WITH MICROSCOPIC - Abnormal; Notable for the following components:   Color, Urine YELLOW (*)    APPearance CLEAR (*)    Hgb urine dipstick SMALL (*)    Ketones, ur 80 (*)    Protein, ur 100 (*)    Nitrite POSITIVE (*)    Bacteria, UA MANY (*)    All other components within normal limits  URINE CULTURE  SARS CORONAVIRUS 2 (TAT 6-24 HRS)  GLUCOSE,  CAPILLARY  MAGNESIUM  LACTIC ACID, PLASMA  LIPASE, BLOOD  URINE DRUG SCREEN, QUALITATIVE ([facility] ONLY)  ETHANOL  MAGNESIUM  PHOSPHORUS  HIV ANTIBODY (ROUTINE TESTING W REFLEX)  LACTIC ACID, PLASMA   ____________________________________________  EKG  ED ECG REPORT I, Blake Divine, the attending physician, personally viewed and interpreted this ECG.   Date: 07/09/2019  EKG Time: 19:16  Rate: 98  Rhythm: normal sinus rhythm  Axis: Normal  Intervals:none  ST&T Change: None   PROCEDURES  Procedure(s) performed (including Critical Care):  Procedures   ____________________________________________   INITIAL IMPRESSION / ASSESSMENT AND PLAN / ED COURSE       59 year old female presents to the ED complaining of persistent vomiting over the course of the past 4 to 5 days not associated with any abdominal pain.  Her abdominal exam is reassuring with no focal tenderness, right upper quadrant ultrasound is negative for acute process.  Lab work shows increased anion gap as well as acidosis, glucose within normal limits and this appears most consistent with an alcoholic ketoacidosis.  She also has mild elevation in her AST  and ALT as well as her bilirubin, likely secondary to her regular alcohol consumption.  Given her difficulty tolerating p.o. along with apparent alcoholic ketoacidosis, case was discussed with hospitalist for admission.      ____________________________________________   FINAL CLINICAL IMPRESSION(S) / ED DIAGNOSES  Final diagnoses:  Nausea and vomiting  Alcoholic ketoacidosis     ED Discharge Orders    None       Note:  This document was prepared using Dragon voice recognition software and may include unintentional dictation errors.   Blake Divine, MD 07/09/19 [phone]

## 2019-07-09 DIAGNOSIS — N390 Urinary tract infection, site not specified: Secondary | ICD-10-CM

## 2019-07-09 DIAGNOSIS — R112 Nausea with vomiting, unspecified: Secondary | ICD-10-CM

## 2019-07-09 DIAGNOSIS — E872 Acidosis: Principal | ICD-10-CM

## 2019-07-09 DIAGNOSIS — R519 Headache, unspecified: Secondary | ICD-10-CM | POA: Diagnosis present

## 2019-07-09 LAB — BASIC METABOLIC PANEL
Anion gap: 15 (ref 5–15)
BUN: 13 mg/dL (ref 6–20)
CO2: 20 mmol/L — ABNORMAL LOW (ref 22–32)
Calcium: 8.6 mg/dL — ABNORMAL LOW (ref 8.9–10.3)
Chloride: 102 mmol/L (ref 98–111)
Creatinine, Ser: 0.73 mg/dL (ref 0.44–1.00)
GFR calc Af Amer: >60 mL/min (ref >60)
GFR calc non Af Amer: >60 mL/min (ref >60)
Glucose, Bld: 58 mg/dL — ABNORMAL LOW (ref 70–99)
Potassium: 4.1 mmol/L (ref 3.5–5.1)
Sodium: 137 mmol/L (ref 135–145)

## 2019-07-09 LAB — LACTIC ACID, PLASMA: Lactic Acid, Venous: 1.9 mmol/L (ref 0.5–1.9)

## 2019-07-09 LAB — URINE DRUG SCREEN, QUALITATIVE (ARMC ONLY)
Amphetamines, Ur Screen: NOT DETECTED
Barbiturates, Ur Screen: NOT DETECTED
Benzodiazepine, Ur Scrn: NOT DETECTED
Cannabinoid 50 Ng, Ur ~~LOC~~: NOT DETECTED
Cocaine Metabolite,Ur ~~LOC~~: NOT DETECTED
MDMA (Ecstasy)Ur Screen: NOT DETECTED
Methadone Scn, Ur: NOT DETECTED
Opiate, Ur Screen: NOT DETECTED
Phencyclidine (PCP) Ur S: NOT DETECTED
Tricyclic, Ur Screen: NOT DETECTED

## 2019-07-09 LAB — GLUCOSE, CAPILLARY
Glucose-Capillary: 65 mg/dL — ABNORMAL LOW (ref 70–99)
Glucose-Capillary: 138 mg/dL — ABNORMAL HIGH (ref 70–99)

## 2019-07-09 LAB — PHOSPHORUS: Phosphorus: 2.8 mg/dL (ref 2.5–4.6)

## 2019-07-09 LAB — LIPASE, BLOOD: Lipase: 50 U/L (ref 11–51)

## 2019-07-09 LAB — SARS CORONAVIRUS 2 BY RT PCR (HOSPITAL ORDER, PERFORMED IN ~~LOC~~ HOSPITAL LAB): SARS Coronavirus 2: NEGATIVE

## 2019-07-09 LAB — ETHANOL: Alcohol, Ethyl (B): <10 mg/dL (ref <10)

## 2019-07-09 MED ORDER — LORAZEPAM 2 MG/ML IJ SOLN
0.0000 mg | Freq: Four times a day (QID) | INTRAMUSCULAR | Status: DC
  Administered 2019-07-09: 2 mg via INTRAVENOUS
  Filled 2019-07-09: qty 1

## 2019-07-09 MED ORDER — ENOXAPARIN SODIUM 40 MG/0.4ML ~~LOC~~ SOLN
40.0000 mg | SUBCUTANEOUS | Status: DC
  Administered 2019-07-09 – 2019-07-10 (×2): 40 mg via SUBCUTANEOUS
  Filled 2019-07-09 (×2): qty 0.4

## 2019-07-09 MED ORDER — ONDANSETRON HCL 4 MG PO TABS: 4.0000 mg | ORAL_TABLET | Freq: Four times a day (QID) | ORAL | Status: DC | PRN

## 2019-07-09 MED ORDER — THIAMINE HCL 100 MG/ML IJ SOLN: 100.0000 mg | Freq: Every day | INTRAMUSCULAR | Status: DC

## 2019-07-09 MED ORDER — SODIUM CHLORIDE 0.9 % IV SOLN
1.0000 g | INTRAVENOUS | Status: DC
  Filled 2019-07-09: qty 10

## 2019-07-09 MED ORDER — LORAZEPAM 1 MG PO TABS
1.0000 mg | ORAL_TABLET | ORAL | Status: DC | PRN
  Administered 2019-07-10: 1 mg via ORAL
  Filled 2019-07-09: qty 1

## 2019-07-09 MED ORDER — FOLIC ACID 1 MG PO TABS
1.0000 mg | ORAL_TABLET | Freq: Every day | ORAL | Status: DC
  Administered 2019-07-09 – 2019-07-10 (×2): 1 mg via ORAL
  Filled 2019-07-09 (×2): qty 1

## 2019-07-09 MED ORDER — ACETAMINOPHEN 325 MG PO TABS: 650.0000 mg | ORAL_TABLET | Freq: Four times a day (QID) | ORAL | Status: DC | PRN

## 2019-07-09 MED ORDER — LORAZEPAM 2 MG/ML IJ SOLN: 0.0000 mg | Freq: Two times a day (BID) | INTRAMUSCULAR | Status: DC

## 2019-07-09 MED ORDER — THIAMINE HCL 100 MG PO TABS
100.0000 mg | ORAL_TABLET | Freq: Every day | ORAL | Status: DC
  Administered 2019-07-09 – 2019-07-10 (×2): 100 mg via ORAL
  Filled 2019-07-09 (×2): qty 1

## 2019-07-09 MED ORDER — ADULT MULTIVITAMIN W/MINERALS CH
1.0000 | ORAL_TABLET | Freq: Every day | ORAL | Status: DC
  Administered 2019-07-09 – 2019-07-10 (×2): 1 via ORAL
  Filled 2019-07-09 (×2): qty 1

## 2019-07-09 MED ORDER — ACETAMINOPHEN 650 MG RE SUPP: 650.0000 mg | Freq: Four times a day (QID) | RECTAL | Status: DC | PRN

## 2019-07-09 MED ORDER — SODIUM CHLORIDE 0.9 % IV SOLN: INTRAVENOUS | Status: DC

## 2019-07-09 MED ORDER — ONDANSETRON HCL 4 MG/2ML IJ SOLN
4.0000 mg | Freq: Four times a day (QID) | INTRAMUSCULAR | Status: DC | PRN
  Administered 2019-07-09 (×2): 4 mg via INTRAVENOUS
  Filled 2019-07-09 (×2): qty 2

## 2019-07-09 MED ORDER — LORAZEPAM 2 MG/ML IJ SOLN: 1.0000 mg | INTRAMUSCULAR | Status: DC | PRN

## 2019-07-09 NOTE — ED Notes (Signed)
Attempted to call report to floor. Unable to speak to receiving RN. Number and name given to secretary for RN to contact this RN with any questions or concerns.

## 2019-07-09 NOTE — ED Notes (Signed)
Assigned bed @ [id number], spoke with RN's Megan/Rebecca

## 2019-07-09 NOTE — H&P (Signed)
History and Physical    Claire Brown [id number] DOB: December 28, 1960 DOA: 07/08/2019  PCP: [facility]   Patient coming from: Home  I have personally briefly reviewed patient's old medical records in [place]  Chief Complaint: Vomiting, generalized weakness  HPI: Claire Brown is a 59 y.o. female with medical history significant for alcohol overuse, 4-5 beers daily on the weekend with less during on the weekdays, who presents to the emergency room by EMS with 1 day history of vomiting x5.  Associated with worsening of a chronic headache .Her emesis is nonbloody, nonbilious and without coffee grounds.   She denies abdominal pain.  She denies cough fever or chills.  Denies change in bowel habits and dysuria.    ED Course: Work-up in the emergency room was significant for serum bicarb of 17 with an anion gap of 24, elevated AST of 127, ALT of 100 and lactic acid of 1.9.  Head CT showed no acute intracranial finding, right upper quadrant sono shows moderate steatosis.  Urinalysis showed positive nitrite though leukocyte esterase negative and many bacteria.  Started on Rocephin for UTI.  Hospitalist consulted for admission.  Review of Systems: As per HPI otherwise 10 point review of systems negative.    Past Medical History:  Diagnosis Date  . Anemia   . Anxiety   . Arthritis   . Bipolar disorder ([place])   . Headache     Past Surgical History:  Procedure Laterality Date  . CESAREAN SECTION    . CYSTOSCOPY N/A 10/14/2015   Procedure: CYSTOSCOPY;  Surgeon: Malachy Mood, MD;  Location: [facility] ORS;  Service: Gynecology;  Laterality: N/A;  . FRACTURE SURGERY Left 12/2015   Femoral Neck Pin Placement - Dr. Mariella Saa [facility])  . KNEE SURGERY Bilateral   . LAPAROSCOPIC BILATERAL SALPINGO OOPHERECTOMY  10/14/2015   Procedure: LAPAROSCOPIC BILATERAL SALPINGO OOPHORECTOMY;  Surgeon: Malachy Mood, MD;  Location: [facility] ORS;  Service: Gynecology;;    . LAPAROSCOPIC HYSTERECTOMY N/A 10/14/2015   Procedure: HYSTERECTOMY TOTAL LAPAROSCOPIC;  Surgeon: Malachy Mood, MD;  Location: [facility] ORS;  Service: Gynecology;  Laterality: N/A;     reports that she has been smoking. She has been smoking about 1.00 pack per day. She has never used smokeless tobacco. She reports current alcohol use. She reports that she does not use drugs.  Allergies  Allergen Reactions  . Augmentin [Amoxicillin-Pot Clavulanate] Nausea Only  . Penicillins Itching and Other (See Comments)    Patient cannot remember the specifics of the reaction. Has taken Amoxicillin since with no problem.  . Oxycodone Anxiety and Other (See Comments)    Reaction: jittery/ unable to sleep  . Valium [Diazepam] Anxiety and Other (See Comments)    Reaction: unable to sleep    Family History  Problem Relation Age of Onset  . Cancer Brother        loyphoma     Prior to Admission medications   Medication Sig Start Date End Date Taking? Authorizing Provider  aspirin 325 MG tablet Take 325 mg by mouth daily.    [provider]  Calcium Carbonate-Vitamin D 600-400 MG-UNIT tablet Take 1 tablet by mouth 2 (two) times daily.    [provider]  diphenhydrAMINE (BENADRYL) 25 MG tablet Take 25 mg by mouth every 6 (six) hours as needed for allergies.    [provider]  ferrous gluconate (FERGON) 324 MG tablet Take 324 mg by mouth daily.    [provider]  furosemide (  LASIX) 20 MG tablet Take 1 tablet (20 mg total) by mouth daily. 05/27/16 05/27/17  Earleen Newport, MD  Multiple Vitamin (MULTIVITAMIN WITH MINERALS) TABS tablet Take 1 tablet by mouth daily.    [provider]  Vitamin D, Ergocalciferol, (DRISDOL) 50000 units CAPS capsule Take 1 capsule by mouth once a week. Patient takes on Saturday 01/17/16   [provider]    Physical Exam: Vitals:   07/08/19 2222 07/08/19 2223 07/08/19 2230 07/08/19 2300  BP:  (!) 143/93 (!) 147/96  (!) 146/88  Pulse: 96  (!) 102 86  Resp:  15    Temp:      TempSrc:      SpO2: 99% 99% 94% 93%  Weight:      Height:         Vitals:   07/08/19 2222 07/08/19 2223 07/08/19 2230 07/08/19 2300  BP:  (!) 143/93 (!) 147/96 (!) 146/88  Pulse: 96  (!) 102 86  Resp:  15    Temp:      TempSrc:      SpO2: 99% 99% 94% 93%  Weight:      Height:        Constitutional: Alert and awake, oriented x3, not in any acute distress. Eyes: PERLA, EOMI, irises appear normal, anicteric sclera,  ENMT: external ears and nose appear normal, normal hearing             Lips appears normal, oropharynx mucosa, tongue, posterior pharynx appear normal  Neck: neck appears normal, no masses, normal ROM, no thyromegaly, no JVD  CVS: S1-S2 clear, no murmur rubs or gallops,  , no carotid bruits, pedal pulses palpable, No LE edema Respiratory:  clear to auscultation bilaterally, no wheezing, rales or rhonchi. Respiratory effort normal. No accessory muscle use.  Abdomen: soft nontender, nondistended, normal bowel sounds, no hepatosplenomegaly, no hernias Musculoskeletal: : no cyanosis, clubbing , no contractures or atrophy Neuro: Cranial nerves II-XII intact, sensation, reflexes normal, strength Psych: judgement and insight appear normal, stable mood and affect,  Skin: no rashes or lesions or ulcers, no induration or nodules   Labs on Admission: I have personally reviewed following labs and imaging studies  CBC: Recent Labs  Lab 07/08/19 1822  WBC 6.2  HGB 15.4*  HCT 45.6  MCV 104.3*  PLT [id number]*   Basic Metabolic Panel: Recent Labs  Lab 07/08/19 1822  NA 140  K 4.1  CL 99  CO2 17*  GLUCOSE 71  BUN 11  CREATININE 0.81  CALCIUM 9.6  MG 2.1   GFR: Estimated Creatinine Clearance: 55 mL/min (by C-G formula based on SCr of 0.81 mg/dL). Liver Function Tests: Recent Labs  Lab 07/08/19 1822  AST 127*  ALT 100*  ALKPHOS 108  BILITOT 2.1*  PROT 9.0*  ALBUMIN 5.2*   No results for input(s):  LIPASE, AMYLASE in the last 168 hours. No results for input(s): AMMONIA in the last 168 hours. Coagulation Profile: No results for input(s): INR, PROTIME in the last 168 hours. Cardiac Enzymes: No results for input(s): CKTOTAL, CKMB, CKMBINDEX, TROPONINI in the last 168 hours. BNP (last 3 results) No results for input(s): PROBNP in the last 8760 hours. HbA1C: No results for input(s): HGBA1C in the last 72 hours. CBG: Recent Labs  Lab 07/08/19 1644 07/08/19 1758  GLUCAP 120* 72   Lipid Profile: No results for input(s): CHOL, HDL, LDLCALC, TRIG, CHOLHDL, LDLDIRECT in the last 72 hours. Thyroid Function Tests: No results for input(s): TSH, T4TOTAL, FREET4, T3FREE,  THYROIDAB in the last 72 hours. Anemia Panel: No results for input(s): VITAMINB12, FOLATE, FERRITIN, TIBC, IRON, RETICCTPCT in the last 72 hours. Urine analysis:    Component Value Date/Time   COLORURINE YELLOW (A) 07/08/2019 1822   APPEARANCEUR CLEAR (A) 07/08/2019 1822   LABSPEC 1.019 07/08/2019 1822   PHURINE 5.0 07/08/2019 1822   GLUCOSEU NEGATIVE 07/08/2019 1822   HGBUR SMALL (A) 07/08/2019 1822   BILIRUBINUR NEGATIVE 07/08/2019 1822   KETONESUR 80 (A) 07/08/2019 1822   PROTEINUR 100 (A) 07/08/2019 1822   NITRITE POSITIVE (A) 07/08/2019 1822   LEUKOCYTESUR NEGATIVE 07/08/2019 1822    Radiological Exams on Admission: CT Head Wo Contrast  Result Date: 07/08/2019 CLINICAL DATA:  Headache and vomiting, recent fall EXAM: CT HEAD WITHOUT CONTRAST TECHNIQUE: Contiguous axial images were obtained from the base of the skull through the vertex without intravenous contrast. COMPARISON:  10/06/2015 FINDINGS: Brain: No acute infarct or hemorrhage. Lateral ventricles and midline structures are unremarkable. No acute extra-axial fluid collections. No mass effect. Vascular: No hyperdense vessel or unexpected calcification. Skull: Normal. Negative for fracture or focal lesion. Sinuses/Orbits: No acute finding. Other: None.  IMPRESSION: 1. No acute intracranial process. Electronically Signed   By: Randa Ngo M.D.   On: 07/08/2019 19:04   US Abdomen Limited RUQ  Result Date: 07/08/2019 CLINICAL DATA:  Nausea and vomiting EXAM: ULTRASOUND ABDOMEN LIMITED RIGHT UPPER QUADRANT COMPARISON:  Ultrasound dated 05/27/2016. FINDINGS: Gallbladder: No gallstones or wall thickening visualized. No sonographic Murphy sign noted by sonographer. Common bile duct: Diameter: 5 mm Liver: Diffuse increased echogenicity with slightly heterogeneous liver. Appearance typically secondary to fatty infiltration. Fibrosis secondary consideration. No secondary findings of cirrhosis noted. No focal hepatic lesion or intrahepatic biliary duct dilatation. Portal vein is patent on color Doppler imaging with normal direction of blood flow towards the liver. Other: None. IMPRESSION: 1. No evidence for cholelithiasis or acute cholecystitis. 2. Hepatic steatosis. Electronically Signed   By: Constance Holster M.D.   On: 07/08/2019 23:37    EKG: Independently reviewed.   Assessment/Plan Principal Problem:   Nausea & vomiting -Patient presenting with an episode of vomiting, headache and tingling and numbness left lower extremity -Possibly related to UTI, possible alcohol use. -Right upper quadrant sonogram showed hepatic steatosis with no cholelithiasis or cholecystitis -Head CT with no acute intracranial abnormality -IV hydration, IV antiemetics -Follow lipase -Treat UTI  UTI -Continue Rocephin    Metabolic acidosis   Alcoholic ketoacidosis -Serum bicarb of 17 with anion gap of 24.  No history of diabetes -Given history of alcohol use, elevated LFTs, hepatic steatosis on RUQ sonogram, suspect alcoholic ketoacidosis -IV hydration -Continue to monitor    Alcohol use disorder, mild, abuse -Counseled on abstinence -CIWA withdrawal protocol.  No drinking 24 hours    Hepatic steatosis -AST 127, ALT 100    DVT prophylaxis: Lovenox  Code  Status: full code  Family Communication:  none  Disposition Plan: Back to previous home environment Consults called: none  Status:obs    Athena Masse MD [facility]     07/09/2019, 12:02 AM

## 2019-07-09 NOTE — ED Notes (Signed)
Admitting MD bedside

## 2019-07-09 NOTE — ED Notes (Signed)
Attempted to call report to 1C.

## 2019-07-09 NOTE — Progress Notes (Signed)
Claire Brown    MR#:  [id number]  DATE OF BIRTH:  [birth year]  SUBJECTIVE:  came in after having acute on chronic headache and some vomiting. Sugars below this morning how were came up after drinking juice. Patient reports drinking alcohol for five cans of beer on the weekend and 1-2 during weekdays.  Denies any abdominal pain. Denies dysuria or fever. No vomiting today. REVIEW OF SYSTEMS:   Review of Systems  Constitutional: Negative for chills, fever and weight loss.  HENT: Negative for ear discharge, ear pain and nosebleeds.   Eyes: Negative for blurred vision, pain and discharge.  Respiratory: Negative for sputum production, shortness of breath, wheezing and stridor.   Cardiovascular: Negative for chest pain, palpitations, orthopnea and PND.  Gastrointestinal: Negative for abdominal pain, diarrhea, nausea and vomiting.  Genitourinary: Negative for frequency and urgency.  Musculoskeletal: Negative for back pain and joint pain.  Neurological: Positive for weakness. Negative for sensory change, speech change and focal weakness.  Psychiatric/Behavioral: Negative for depression and hallucinations. The patient is not nervous/anxious.    Tolerating Diet:yes Tolerating PT:   DRUG ALLERGIES:   Allergies  Allergen Reactions  . Augmentin [Amoxicillin-Pot Clavulanate] Nausea Only  . Penicillins Itching and Other (See Comments)    Patient cannot remember the specifics of the reaction. Has taken Amoxicillin since with no problem.  . Oxycodone Anxiety and Other (See Comments)    Reaction: jittery/ unable to sleep  . Valium [Diazepam] Anxiety and Other (See Comments)    Reaction: unable to sleep    VITALS:  Blood pressure 122/79, pulse 88, temperature 98.9 F (37.2 C), temperature source Oral, resp. rate 18, height 5' (1.524 m), weight 50.8 kg, SpO2 99 %.  PHYSICAL EXAMINATION:   Physical Exam  GENERAL:  59  y.o.-year-old patient lying in the bed with no acute distress.  EYES: Pupils equal, round, reactive to light and accommodation. No scleral icterus.   HEENT: Head atraumatic, normocephalic. Oropharynx and nasopharynx clear.  NECK:  Supple, no jugular venous distention. No thyroid enlargement, no tenderness.  LUNGS: Normal breath sounds bilaterally, no wheezing, rales, rhonchi. No use of accessory muscles of respiration.  CARDIOVASCULAR: S1, S2 normal. No murmurs, rubs, or gallops.  ABDOMEN: Soft, nontender, nondistended. Bowel sounds present. No organomegaly or mass.  EXTREMITIES: No cyanosis, clubbing or edema b/l.    NEUROLOGIC: Cranial nerves II through XII are intact. No focal Motor or sensory deficits b/l.   PSYCHIATRIC:  patient is alert and oriented x 3.  SKIN: No obvious rash, lesion, or ulcer.   LABORATORY PANEL:  CBC Recent Labs  Lab 07/08/19 1822  WBC 6.2  HGB 15.4*  HCT 45.6  PLT 131*    Chemistries  Recent Labs  Lab 07/08/19 1822 07/08/19 1822 07/09/19 0859  NA 140   < > 137  K 4.1   < > 4.1  CL 99   < > 102  CO2 17*   < > 20*  GLUCOSE 71   < > 58*  BUN 11   < > 13  CREATININE 0.81   < > 0.73  CALCIUM 9.6   < > 8.6*  MG 2.1  --   --   AST 127*  --   --   ALT 100*  --   --   ALKPHOS 108  --   --   BILITOT 2.1*  --   --    < > =  values in this interval not displayed.   Cardiac Enzymes No results for input(s): TROPONINI in the last 168 hours. RADIOLOGY:  CT Head Wo Contrast  Result Date: 07/08/2019 CLINICAL DATA:  Headache and vomiting, recent fall EXAM: CT HEAD WITHOUT CONTRAST TECHNIQUE: Contiguous axial images were obtained from the base of the skull through the vertex without intravenous contrast. COMPARISON:  10/06/2015 FINDINGS: Brain: No acute infarct or hemorrhage. Lateral ventricles and midline structures are unremarkable. No acute extra-axial fluid collections. No mass effect. Vascular: No hyperdense vessel or unexpected calcification. Skull:  Normal. Negative for fracture or focal lesion. Sinuses/Orbits: No acute finding. Other: None. IMPRESSION: 1. No acute intracranial process. Electronically Signed   By: Randa Ngo M.D.   On: 07/08/2019 19:04   US Abdomen Limited RUQ  Result Date: 07/08/2019 CLINICAL DATA:  Nausea and vomiting EXAM: ULTRASOUND ABDOMEN LIMITED RIGHT UPPER QUADRANT COMPARISON:  Ultrasound dated 05/27/2016. FINDINGS: Gallbladder: No gallstones or wall thickening visualized. No sonographic Murphy sign noted by sonographer. Common bile duct: Diameter: 5 mm Liver: Diffuse increased echogenicity with slightly heterogeneous liver. Appearance typically secondary to fatty infiltration. Fibrosis secondary consideration. No secondary findings of cirrhosis noted. No focal hepatic lesion or intrahepatic biliary duct dilatation. Portal vein is patent on color Doppler imaging with normal direction of blood flow towards the liver. Other: None. IMPRESSION: 1. No evidence for cholelithiasis or acute cholecystitis. 2. Hepatic steatosis. Electronically Signed   By: Constance Holster M.D.   On: 07/08/2019 23:37   ASSESSMENT AND PLAN:  Claire Brown is a 59 y.o. female with medical history significant for alcohol overuse, 4-5 beers daily on the weekend with less during on the weekdays, who presents to the emergency room by EMS with 1 day history of vomiting x5.  Associated with worsening of a chronic headache .Her emesis is nonbloody, nonbilious and without coffee grounds.    Nausea & vomiting -Patient presenting with an episode of vomiting, headache and tingling and numbness left lower extremity -Possibly related to alcohol use. -Right upper quadrant sonogram showed hepatic steatosis with no cholelithiasis or cholecystitis -Head CT with no acute intracranial abnormality -IV hydration, IV antiemetics -Follow lipase is wnl  Alcoholic hepatitis Pt advised to abstain from drinking USG liver--noted  Abnormal Ua -pt has no  dysuria, fever -received  Rocephin in ER--I will d/c abxs -wbc normal     Metabolic acidosis   Alcoholic ketoacidosis -Serum bicarb of 17 with anion gap of 24.  No history of diabetes -Given history of alcohol use, elevated LFTs, hepatic steatosis on RUQ sonogram, suspect alcoholic ketoacidosis -IV hydration--labs show much improvemnt    Alcohol use disorder, mild, abuse -Counseled on abstinence -CIWA withdrawal protocol.  No drinking 24 hours       DVT prophylaxis: Lovenox  Code Status: full code  Family Communication:  none  Disposition Plan: Back to previous home environment Consults called: none   Status is: Inpatient  Remains inpatient appropriate because:IV treatments appropriate due to intensity of illness or inability to take PO   Dispo: The patient is from: Home              Anticipated d/c is to: Home              Anticipated d/c date is: 1 day              Patient currently is not medically stable to d/c.  TOTAL TIME TAKING CARE OF THIS PATIENT: 30 minutes.  >50% time spent on counselling and  coordination of care  Note: This dictation was prepared with Dragon dictation along with smaller phrase technology. Any transcriptional errors that result from this process are unintentional.  Fritzi Mandes M.D    [facility]   CC: Primary care physician; [facility] FamilyPatient ID: Claire Brown, female   DOB: Sep 14, 1960, 59 y.o.   MRN: [id number]

## 2019-07-10 DIAGNOSIS — K760 Fatty (change of) liver, not elsewhere classified: Secondary | ICD-10-CM

## 2019-07-10 LAB — MAGNESIUM: Magnesium: 1.9 mg/dL (ref 1.7–2.4)

## 2019-07-10 MED ORDER — CEPHALEXIN 500 MG PO CAPS: 500.0000 mg | ORAL_CAPSULE | Freq: Three times a day (TID) | ORAL | 0 refills | Status: AC

## 2019-07-10 MED ORDER — CEPHALEXIN 500 MG PO CAPS: 500.0000 mg | ORAL_CAPSULE | Freq: Three times a day (TID) | ORAL | Status: DC

## 2019-07-10 NOTE — Discharge Summary (Signed)
[facility] NAME: Densie Zingsheim    MR#:  [id number]  DATE OF BIRTH:  1960/05/29  DATE OF ADMISSION:  07/08/2019 ADMITTING PHYSICIAN: Fritzi Mandes, MD  DATE OF DISCHARGE: 07/10/2019  PRIMARY CARE PHYSICIAN: Medicine, [facility]    ADMISSION DIAGNOSIS:  Alcoholic ketoacidosis 99991111 UTI (urinary tract infection) [N39.0] Nausea and vomiting [R11.2] Intractable headache [R51.9]  DISCHARGE DIAGNOSIS:  Ketoacidosis suspected Alcoholic UTI  SECONDARY DIAGNOSIS:   Past Medical History:  Diagnosis Date  . Anemia   . Anxiety   . Arthritis   . Bipolar disorder ([place])   . Headache     HOSPITAL COURSE:  GIANNAMARIE CIPRIAN a 59 y.o.femalewith medical history significant foralcohol overuse, 4-5 beersdaily on the weekend with less during on the weekdays, who presents to the emergency room by EMS with1 day history of vomiting x5.Associated with worsening of a chronic headache.Her emesis is nonbloody, nonbilious and without coffee grounds.   Nausea &vomiting--resolved -Possibly related to alcohol use. -Right upper quadrant sonogram showed hepatic steatosis with no cholelithiasis or cholecystitis -Head CT with no acute intracranial abnormality -received IV hydration - lipase is wnl -tolerating po diet  Alcoholic hepatitis Pt advised to abstain from drinking USG liver--noted  Abnormal Ua with UC GNR -pt has no dysuria, fever -received  Rocephin in ER---change to po keflex 5 days -wbc normal   Metabolic acidosis Alcoholic ketoacidosis -Serum bicarb of 17 with anion gap of 24. No history of diabetes -Given history of alcohol use, elevated LFTs, hepatic steatosis on RUQ sonogram, suspect alcoholic ketoacidosis -IV hydration--labs show much improvemnt  Alcohol use disorder, mild, abuse -Counseled on abstinence -CIWA withdrawal protocol--scoring zero    Overall improved--d/c  home, pt agreeable DVT prophylaxis:Lovenox  Code Status:full code  Family Communication:none  Disposition Plan:Back to previous home environment Consults called:none   Status is: Inpatient   Dispo: The patient is from: Home  Anticipated d/c is to: Home  Anticipated d/c date is: 07/10/2019  Patient currently is medically stable to d/c.  CONSULTS OBTAINED:    DRUG ALLERGIES:   Allergies  Allergen Reactions  . Augmentin [Amoxicillin-Pot Clavulanate] Nausea Only  . Penicillins Itching and Other (See Comments)    Patient cannot remember the specifics of the reaction. Has taken Amoxicillin since with no problem.  . Oxycodone Anxiety and Other (See Comments)    Reaction: jittery/ unable to sleep  . Valium [Diazepam] Anxiety and Other (See Comments)    Reaction: unable to sleep    DISCHARGE MEDICATIONS:   Allergies as of 07/10/2019      Reactions   Augmentin [amoxicillin-pot Clavulanate] Nausea Only   Penicillins Itching, Other (See Comments)   Patient cannot remember the specifics of the reaction. Has taken Amoxicillin since with no problem.   Oxycodone Anxiety, Other (See Comments)   Reaction: jittery/ unable to sleep   Valium [diazepam] Anxiety, Other (See Comments)   Reaction: unable to sleep      Medication List    STOP taking these medications   furosemide 20 MG tablet Commonly known as: Lasix     TAKE these medications   aspirin 325 MG tablet Take 325 mg by mouth daily as needed.   Calcium Carbonate-Vitamin D 600-400 MG-UNIT tablet Take 1 tablet by mouth 2 (two) times daily.   cephALEXin 500 MG capsule Commonly known as: KEFLEX Take 1 capsule (500 mg total) by mouth every 8 (eight) hours for 5 days.   diphenhydrAMINE 25 MG  tablet Commonly known as: BENADRYL Take 25 mg by mouth every 6 (six) hours as needed for allergies.   ferrous gluconate 324 MG tablet Commonly known as: FERGON Take 324 mg by mouth  daily.   multivitamin with minerals Tabs tablet Take 1 tablet by mouth daily.   Vitamin D (Ergocalciferol) 1.25 MG (50000 UNIT) Caps capsule Commonly known as: DRISDOL Take 1 capsule by mouth once a week. Patient takes on Saturday       If you experience worsening of your admission symptoms, develop shortness of breath, life threatening emergency, suicidal or homicidal thoughts you must seek medical attention immediately by calling 911 or calling your MD immediately  if symptoms less severe.  You Must read complete instructions/literature along with all the possible adverse reactions/side effects for all the Medicines you take and that have been prescribed to you. Take any new Medicines after you have completely understood and accept all the possible adverse reactions/side effects.   Please note  You were cared for by a hospitalist during your hospital stay. If you have any questions about your discharge medications or the care you received while you were in the hospital after you are discharged, you can call the unit and asked to speak with the hospitalist on call if the hospitalist that took care of you is not available. Once you are discharged, your primary care physician will handle any further medical issues. Please note that NO REFILLS for any discharge medications will be authorized once you are discharged, as it is imperative that you return to your primary care physician (or establish a relationship with a primary care physician if you do not have one) for your aftercare needs so that they can reassess your need for medications and monitor your lab values. Today   SUBJECTIVE   Feels a lot better. Tolerating po diet  VITAL SIGNS:  Blood pressure (!) 143/81, pulse 79, temperature 99.1 F (37.3 C), temperature source Oral, resp. rate 18, height 5' (1.524 m), weight 50.8 kg, SpO2 97 %.  I/O:    Intake/Output Summary (Last 24 hours) at 07/10/2019 1130 Last data filed at 07/10/2019  0800 Gross per 24 hour  Intake 2324.57 ml  Output --  Net 2324.57 ml    PHYSICAL EXAMINATION:  GENERAL:  59 y.o.-year-old patient lying in the bed with no acute distress.  EYES: Pupils equal, round, reactive to light and accommodation. No scleral icterus.  HEENT: Head atraumatic, normocephalic. Oropharynx and nasopharynx clear.  NECK:  Supple, no jugular venous distention. No thyroid enlargement, no tenderness.  LUNGS: Normal breath sounds bilaterally, no wheezing, rales,rhonchi or crepitation. No use of accessory muscles of respiration.  CARDIOVASCULAR: S1, S2 normal. No murmurs, rubs, or gallops.  ABDOMEN: Soft, non-tender, non-distended. Bowel sounds present. No organomegaly or mass.  EXTREMITIES: No pedal edema, cyanosis, or clubbing.  NEUROLOGIC: Cranial nerves II through XII are intact. Muscle strength 5/5 in all extremities. Sensation intact. Gait not checked.  PSYCHIATRIC: The patient is alert and oriented x 3.  SKIN: No obvious rash, lesion, or ulcer.   DATA REVIEW:   CBC  Recent Labs  Lab 07/08/19 1822  WBC 6.2  HGB 15.4*  HCT 45.6  PLT 131*    Chemistries  Recent Labs  Lab 07/08/19 1822 07/08/19 1822 07/09/19 0859  NA 140   < > 137  K 4.1   < > 4.1  CL 99   < > 102  CO2 17*   < > 20*  GLUCOSE 71   < >  58*  BUN 11   < > 13  CREATININE 0.81   < > 0.73  CALCIUM 9.6   < > 8.6*  MG 2.1   < > 1.9  AST 127*  --   --   ALT 100*  --   --   ALKPHOS 108  --   --   BILITOT 2.1*  --   --    < > = values in this interval not displayed.    Microbiology Results   Recent Results (from the past 240 hour(s))  Urine culture     Status: Abnormal (Preliminary result)   Collection Time: 07/08/19  6:22 PM   Specimen: Urine, Random  Result Value Ref Range Status   Specimen Description   Final    URINE, RANDOM Performed at [facility], [address], [place], [place] [address]    Special Requests   Final    NONE Performed at [facility], [address] [place], [place] [address]    Culture (A)  Final    >=100,000 COLONIES/mL ESCHERICHIA COLI SUSCEPTIBILITIES TO FOLLOW Performed at [facility], [place] [address] [place], [place] [address]    Report Status PENDING  Incomplete  SARS Coronavirus 2 by RT PCR (hospital order, performed in [place] hospital lab)     Status: None   Collection Time: 07/08/19 10:41 PM  Result Value Ref Range Status   SARS Coronavirus 2 NEGATIVE NEGATIVE Final    Comment: (NOTE) SARS-CoV-2 target nucleic acids are NOT DETECTED. The SARS-CoV-2 RNA is generally detectable in upper and lower respiratory specimens during the acute phase of infection. The lowest concentration of SARS-CoV-2 viral copies this assay can detect is 250 copies / mL. A negative result does not preclude SARS-CoV-2 infection and should not be used as the sole basis for treatment or other patient management decisions.  A negative result may occur with improper specimen collection / handling, submission of specimen other than nasopharyngeal swab, presence of viral mutation(s) within the areas targeted by this assay, and inadequate number of viral copies (<250 copies / mL). A negative result must be combined with clinical observations, patient history, and epidemiological information. Fact Sheet for Patients:   StrictlyIdeas.no Fact Sheet for Healthcare Providers: BankingDealers.co.za This test is not yet approved or cleared  by the [place] FDA and has been authorized for detection and/or diagnosis of SARS-CoV-2 by FDA under an Emergency Use Authorization (EUA).  This EUA will remain in effect (meaning this test can be used) for the duration of the COVID-19 declaration under Section 564(b)(1) of the Act, 21 U.S.C. section 360bbb-3(b)(1), unless the authorization is terminated or revoked sooner. Performed at [facility], [address] [place], [place]  [address]     RADIOLOGY:  CT Head Wo Contrast  Result Date: 07/08/2019 CLINICAL DATA:  Headache and vomiting, recent fall EXAM: CT HEAD WITHOUT CONTRAST TECHNIQUE: Contiguous axial images were obtained from the base of the skull through the vertex without intravenous contrast. COMPARISON:  10/06/2015 FINDINGS: Brain: No acute infarct or hemorrhage. Lateral ventricles and midline structures are unremarkable. No acute extra-axial fluid collections. No mass effect. Vascular: No hyperdense vessel or unexpected calcification. Skull: Normal. Negative for fracture or focal lesion. Sinuses/Orbits: No acute finding. Other: None. IMPRESSION: 1. No acute intracranial process. Electronically Signed   By: Randa Ngo M.D.   On: 07/08/2019 19:04   US Abdomen Limited RUQ  Result Date: 07/08/2019 CLINICAL DATA:  Nausea and vomiting  EXAM: ULTRASOUND ABDOMEN LIMITED RIGHT UPPER QUADRANT COMPARISON:  Ultrasound dated 05/27/2016. FINDINGS: Gallbladder: No gallstones or wall thickening visualized. No sonographic Murphy sign noted by sonographer. Common bile duct: Diameter: 5 mm Liver: Diffuse increased echogenicity with slightly heterogeneous liver. Appearance typically secondary to fatty infiltration. Fibrosis secondary consideration. No secondary findings of cirrhosis noted. No focal hepatic lesion or intrahepatic biliary duct dilatation. Portal vein is patent on color Doppler imaging with normal direction of blood flow towards the liver. Other: None. IMPRESSION: 1. No evidence for cholelithiasis or acute cholecystitis. 2. Hepatic steatosis. Electronically Signed   By: Constance Holster M.D.   On: 07/08/2019 23:37     CODE STATUS:     Code Status Orders  (From admission, onward)         Start     Ordered   07/09/19 0017  Full code  Continuous     07/09/19 0018        Code Status History    Date Active Date Inactive Code Status Order ID Comments User Context   03/29/2016 0355 03/29/2016 2158 Full Code  [id number]  Mikael Spray, NP ED   10/14/2015 1505 10/15/2015 1834 Full Code [id number]  Malachy Mood, MD Inpatient   Advance Care Planning Activity       TOTAL TIME TAKING CARE OF THIS PATIENT: *40* minutes.    Fritzi Mandes M.D  [facility]    CC: Primary care physician; Medicine, [place]

## 2019-07-10 NOTE — TOC Initial Note (Signed)
Transition of Care [facility]) - Initial/Assessment Note    Patient Details  Name: Claire Brown MRN: [id number] Date of Birth: 1960-03-02  Transition of [facility]) CM/SW Contact:    Shelbie Hutching, RN Phone Number: 07/10/2019, 12:43 PM  Clinical Narrative:                 Patient admitted to the hospital with nausea and vomiting, alcoholic ketoacidosis, and UTI.  Patient reports that she is from home where she lives alone and is independent in ADL's.  Patient lives in [place] but prefers to come to [place] instead of [place] for medical care.  Patient currently does not have any insurance but reports that she is in the process of getting disability and has a hearing coming up in June.  Patient also reports that she does not have a primary care doctor at this time.  This RNCM provided patient with resources for indigent health clinics- [facility], [facility], [facility]- patient has the information from the [employer] and Low cost healthcare in [place].  Patient also given information on [facility] and [facility] for substance abuse treatment.  Patient was given financial assistance information and a Good Rx card when she was in the emergency department.  Patient verbalizes understanding and has no questions at this time.  Patient is medically cleared for discharge home, a friend is coming to pick her up.   Expected Discharge Plan: Home/Self Care Barriers to Discharge: Barriers Resolved   Patient Goals and CMS Choice Patient states their goals for this hospitalization and ongoing recovery are:: Ready to get home      Expected Discharge Plan and Services Expected Discharge Plan: Home/Self Care   Discharge Planning Services: CM Consult, Medication Assistance, [facility]   Living arrangements for the past 2 months: Single Family Home Expected Discharge Date: 07/10/19                                     Prior Living Arrangements/Services Living arrangements for the past 2 months: Single Family Home Lives with:: Self Patient language and need for interpreter reviewed:: Yes Do you feel safe going back to the place where you live?: Yes      Need for Family Participation in Patient Care: No (Comment) Care giver support system in place?: Yes (comment)(good neighbors)   Criminal Activity/Legal Involvement Pertinent to Current Situation/Hospitalization: No - Comment as needed  Activities of Daily Living Home Assistive Devices/Equipment: Cane (specify quad or straight), Walker (specify type), Shower chair with back ADL Screening (condition at time of admission) Patient's cognitive ability adequate to safely complete daily activities?: Yes Is the patient deaf or have difficulty hearing?: No Does the patient have difficulty seeing, even when wearing glasses/contacts?: No Does the patient have difficulty concentrating, remembering, or making decisions?: No Patient able to express need for assistance with ADLs?: Yes Does the patient have difficulty dressing or bathing?: No Independently performs ADLs?: Yes (appropriate for developmental age) Does the patient have difficulty walking or climbing stairs?: Yes Weakness of Legs: Both Weakness of Arms/Hands: None  Permission Sought/Granted                  Emotional Assessment Appearance:: Appears older than stated age Attitude/Demeanor/Rapport: Engaged Affect (typically observed): Accepting, Pleasant Orientation: : Oriented to Self, Oriented to Place, Oriented to  Time, Oriented to Situation Alcohol /  Substance Use: Alcohol Use Psych Involvement: No (comment)  Admission diagnosis:  Alcoholic ketoacidosis 99991111 UTI (urinary tract infection) [N39.0] Nausea and vomiting [R11.2] Intractable headache [R51.9] Patient Active Problem List   Diagnosis Date Noted  . Nausea & vomiting 07/09/2019  . UTI (urinary tract infection) 07/09/2019   . Intractable headache 07/09/2019  . Alcohol use disorder, mild, abuse 07/08/2019  . Hepatic steatosis 07/08/2019  . Alcoholic ketoacidosis [id number]  . Metabolic acidosis [id number]  . Shock ([place]) 03/29/2016  . S/P laparoscopic hysterectomy 10/14/2015  . Ovarian mass, right 09/24/2015   PCP:  Medicine, [place]:   [facility] [place], [place], [id number], [place] [address] Phone: [phone] Fax: [phone]  [place] [id number], [place] [address] Phone: [phone] Fax: [phone]  [id number] [place], [place] [place] [address] Phone: [phone] Fax: [phone]     Social Determinants of Health (SDOH) Interventions    Readmission Risk Interventions No flowsheet data found.

## 2019-07-10 NOTE — Progress Notes (Signed)
Patient given discharge instructions and verbalizes understanding. Patient with no complaints at the current time. Will d/c via wc to friends car.

## 2019-07-10 NOTE — Discharge Instructions (Signed)
Abstain from drinking alcohol °

## 2019-07-11 LAB — URINE CULTURE: Culture: >=100000 — AB

## 2020-10-20 IMAGING — CT CT HEAD W/O CM
4 series · 15 of 47 positions shown, 17 images · non-contrast
Comparison: 10/06/2015

CLINICAL DATA: Headache and vomiting, recent fall

EXAM:
CT HEAD WITHOUT CONTRAST
TECHNIQUE: Contiguous axial images were obtained from the base of the skull
through the vertex without intravenous contrast.

[Series 2: head bone · axial · 0.47mm/px · z∈[-141,-127]mm · 2 of 72 slices shown]
[im 8/72  bone]
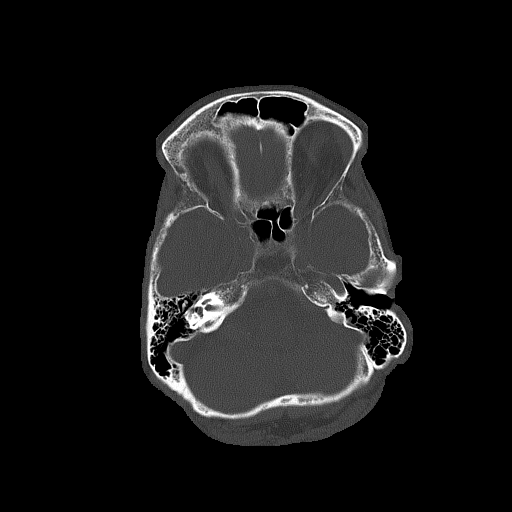
[im 15/72  bone]
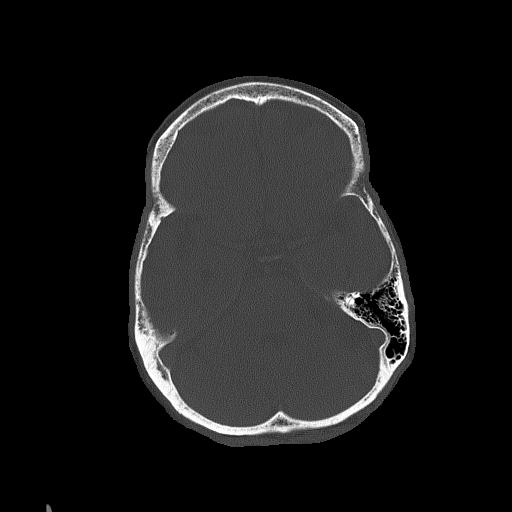

[Series 3: ax head wo · axial · 0.29mm/px · z∈[-142,-28]mm · 7 of 31 slices shown, 9 images]
[im 4/31  brain]
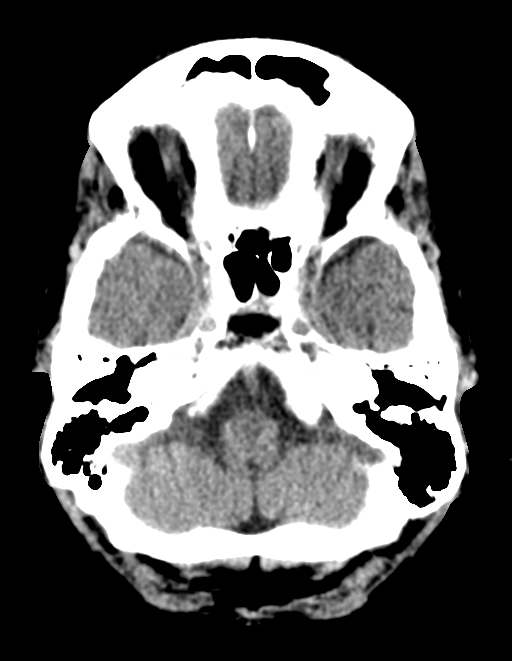
[im 4/31  bone]
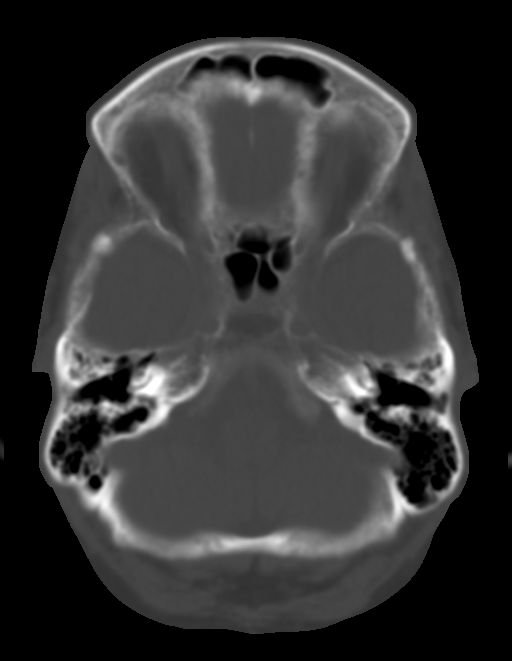
[im 8/31  brain]
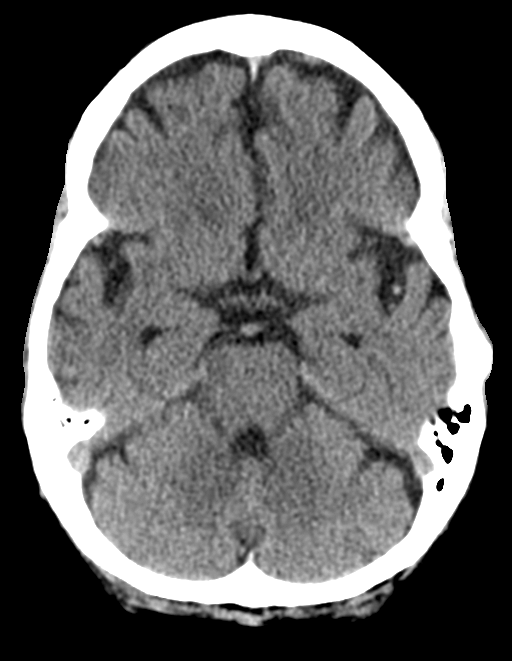
[im 12/31  brain]
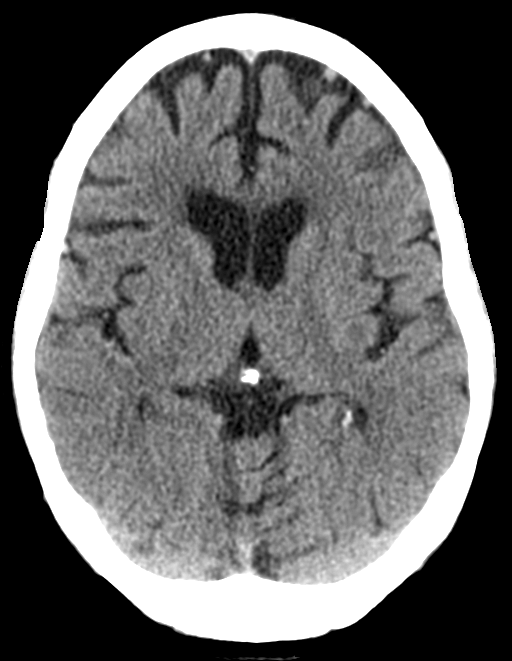
[im 16/31  brain]
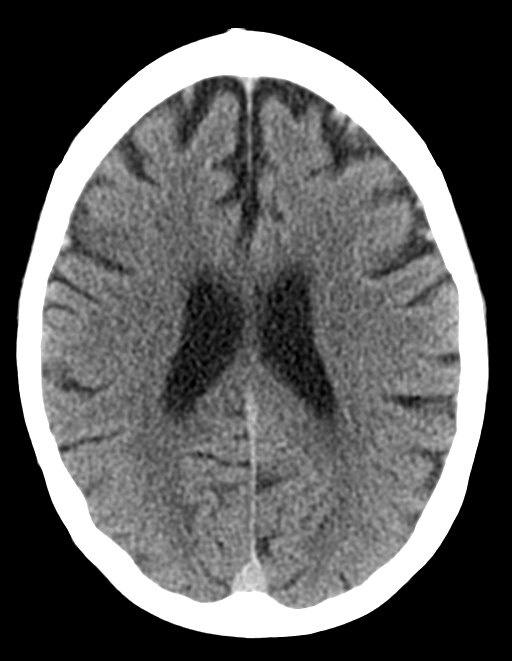
[im 19/31  brain]
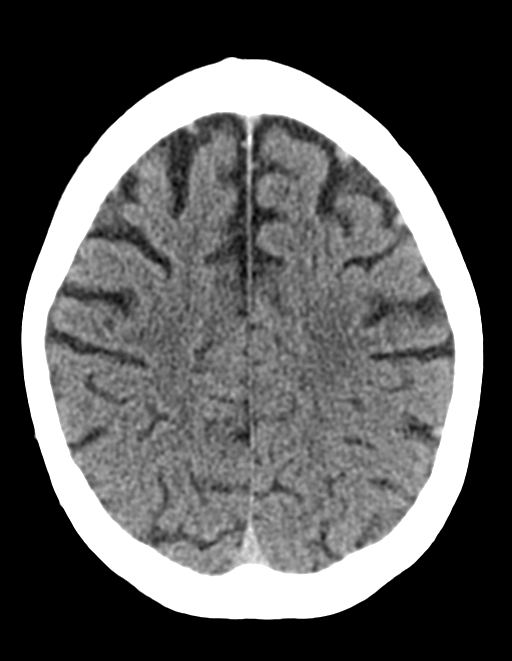
[im 19/31  bone]
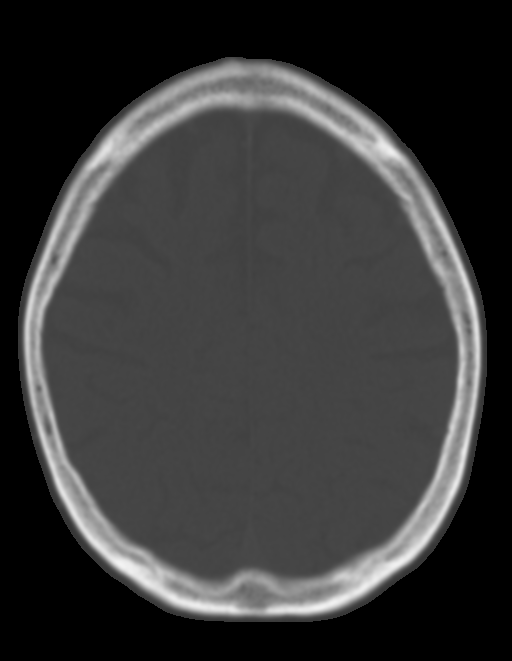
[im 23/31  brain]
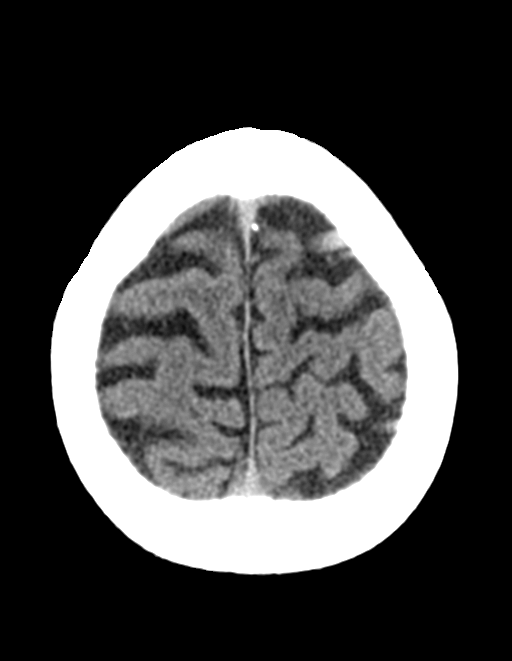
[im 27/31  brain]
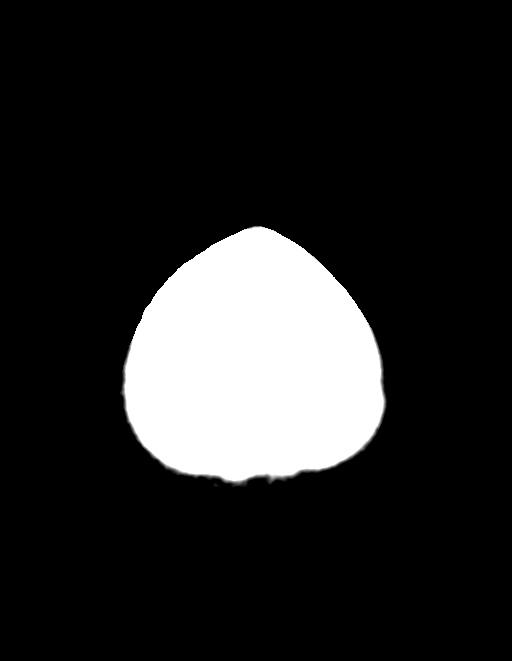

[Series 4: coronal soft tissue · coronal · 0.29mm/px · 3 of 64 slices shown]
[im 22/64  brain]
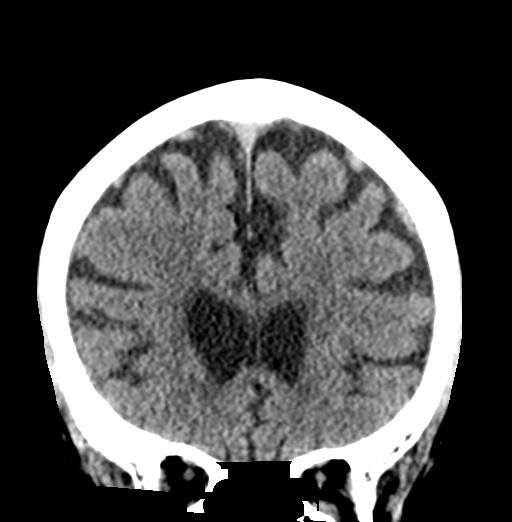
[im 29/64  brain]
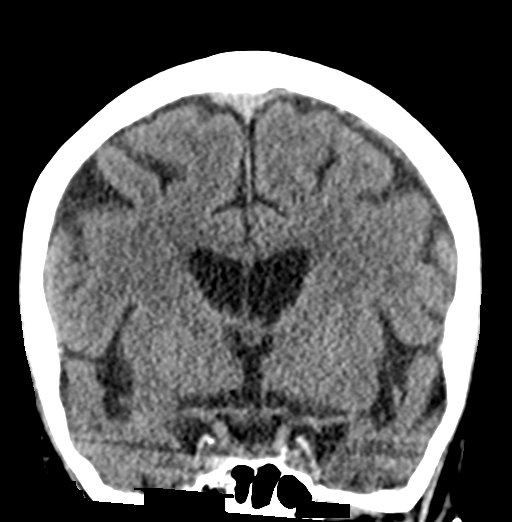
[im 36/64  brain]
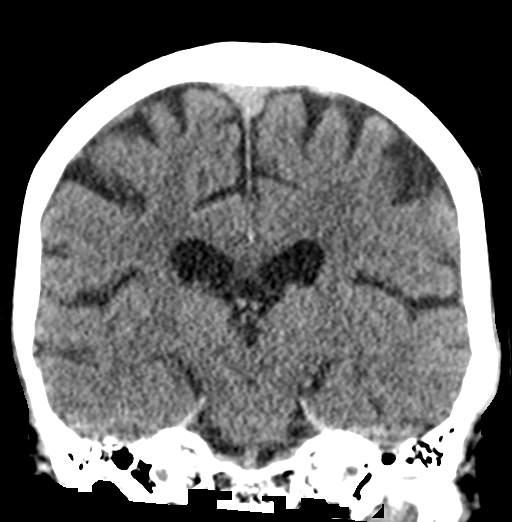

[Series 5: sagittal soft tissue · sagittal · 0.29mm/px · 3 of 50 slices shown]
[im 17/50  brain]
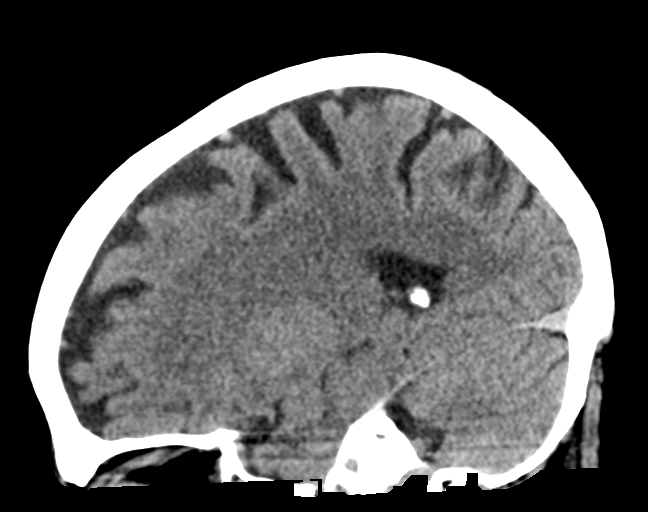
[im 25/50  brain]
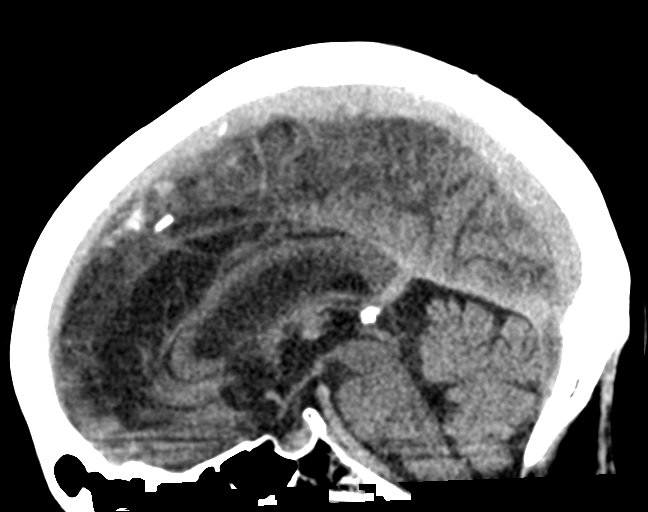
[im 33/50  brain]
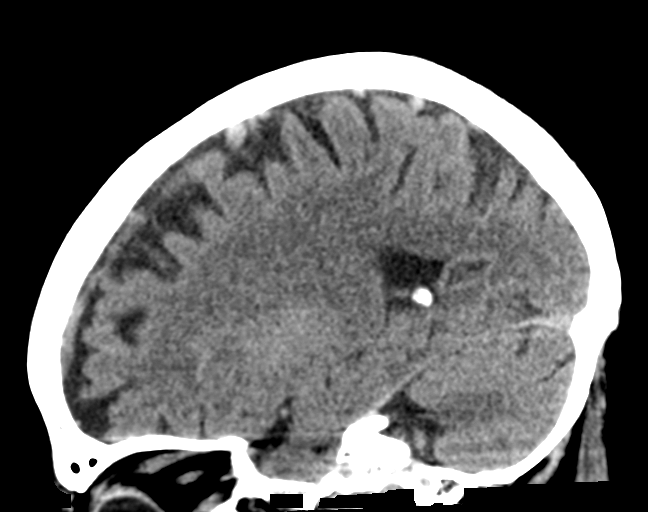

[15 of 47 positions shown; findings below may reference images not displayed]

FINDINGS: Brain: No acute infarct or hemorrhage. Lateral ventricles and
midline structures are unremarkable. No acute extra-axial fluid
collections. No mass effect.

Vascular: No hyperdense vessel or unexpected calcification.

Skull: Normal. Negative for fracture or focal lesion.

Sinuses/Orbits: No acute finding.

Other: None.
IMPRESSION: 1. No acute intracranial process.

## 2022-05-09 ENCOUNTER — Emergency Department
Admission: EM | Admit: 2022-05-09 | Discharge: 2022-05-09 | Disposition: A | Discharge status: Home / Self Care | Payer: Medicaid Other | Attending: Emergency Medicine | Admitting: Emergency Medicine

## 2022-05-09 ENCOUNTER — Emergency Department: Payer: Medicaid Other

## 2022-05-09 ENCOUNTER — Other Ambulatory Visit: Payer: Self-pay

## 2022-05-09 DIAGNOSIS — W19XXXA Unspecified fall, initial encounter: Secondary | ICD-10-CM

## 2022-05-09 DIAGNOSIS — S2241XA Multiple fractures of ribs, right side, initial encounter for closed fracture: Secondary | ICD-10-CM | POA: Diagnosis not present

## 2022-05-09 DIAGNOSIS — W010XXA Fall on same level from slipping, tripping and stumbling without subsequent striking against object, initial encounter: Secondary | ICD-10-CM | POA: Diagnosis not present

## 2022-05-09 DIAGNOSIS — R0781 Pleurodynia: Secondary | ICD-10-CM | POA: Diagnosis present

## 2022-05-09 MED ORDER — MELOXICAM 15 MG PO TABS: 15.0000 mg | ORAL_TABLET | Freq: Every day | ORAL | 0 refills | Status: AC

## 2022-05-09 MED ORDER — HYDROCODONE-ACETAMINOPHEN 5-325 MG PO TABS
1.0000 | ORAL_TABLET | Freq: Once | ORAL | Status: AC
  Administered 2022-05-09: 1 via ORAL
  Filled 2022-05-09: qty 1

## 2022-05-09 MED ORDER — MELOXICAM 7.5 MG PO TABS
15.0000 mg | ORAL_TABLET | Freq: Once | ORAL | Status: AC
  Administered 2022-05-09: 15 mg via ORAL
  Filled 2022-05-09: qty 2

## 2022-05-09 MED ORDER — HYDROCODONE-ACETAMINOPHEN 5-325 MG PO TABS: 1.0000 | ORAL_TABLET | ORAL | 0 refills | Status: DC | PRN

## 2022-05-09 NOTE — ED Triage Notes (Signed)
Pt to ED via POV from home. Pt reports fall last weekend and fell into recliner. Pt denies LOC and head trauma. Pt reports right sided rib pain since fall that has continued to get worse. Pt denies blood thinners.

## 2022-05-09 NOTE — ED Notes (Signed)
Pt states she drinks 2-3 beers daily. Pt states she has never gone through withdrawal and pt smokes cigarettes 1/2 pack daily.

## 2022-05-09 NOTE — ED Provider Notes (Signed)
[facility] Provider Note  Patient Contact: 4:09 PM (approximate)   History   Fall   HPI  Claire Brown is a 62 y.o. female who presents the emergency department for evaluation of rib pain.  Patient states that she had a mechanical fall where she tripped over back and forth, fell and landed with her right ribs against the edge of a chair.  Patient did not hit her head, did not sustain any other injury.  Initially she thought she bruised her ribs.  States that at first it was manageable with over-the-counter medications, however she sneezed and had severe sharp pain in her right ribs.  Patient presents for evaluation.  Denies substernal chest pain, shortness of breath.  No other complaints.     Physical Exam   Triage Vital Signs: ED Triage Vitals  Enc Vitals Group     BP 05/09/22 1415 134/85     Pulse Rate 05/09/22 1415 97     Resp 05/09/22 1415 18     Temp 05/09/22 1415 98.4 F (36.9 C)     Temp Source 05/09/22 1415 Oral     SpO2 05/09/22 1415 97 %     Weight --      Height --      Head Circumference --      Peak Flow --      Pain Score 05/09/22 1422 7     Pain Loc --      Pain Edu? --      Excl. in [place]? --     Most recent vital signs: Vitals:   05/09/22 1415  BP: 134/85  Pulse: 97  Resp: 18  Temp: 98.4 F (36.9 C)  SpO2: 97%     General: Alert and in no acute distress.  Cardiovascular:  Good peripheral perfusion Respiratory: Normal respiratory effort without tachypnea or retractions. Lungs CTAB. Good air entry to the bases with no decreased or absent breath sounds Gastrointestinal: Bowel sounds 4 quadrants. Soft and nontender to palpation. No guarding or rigidity. No palpable masses. No distention. No CVA tenderness. Musculoskeletal: Full range of motion to all extremities.  Visualization of the patient's chest wall reveals no open wounds, large areas of ecchymosis, paradoxical chest wall movement.  Tender along the lateral ribs  over ribs 6 through 10.  No palpable abnormality or crepitus.  No subcutaneous emphysema.  Good underlying breath sounds bilaterally. Neurologic:  No gross focal neurologic deficits are appreciated.  Skin:   No rash noted Other:   ED Results / Procedures / Treatments   Labs (all labs ordered are listed, but only abnormal results are displayed) Labs Reviewed - No data to display   EKG     RADIOLOGY  I personally viewed, evaluated, and interpreted these images as part of my medical decision making, as well as reviewing the written report by the radiologist.  ED Provider Interpretation: 2 nondisplaced rib fractures to the right chest, specifically ribs 8 and 9.  DG Ribs Unilateral W/Chest Right  Result Date: 05/09/2022 CLINICAL DATA:  Fall approximately 1 week ago. Worsening right-sided rib pain. EXAM: RIGHT RIBS AND CHEST - 3+ VIEW COMPARISON:  03/29/2016 chest radiograph FINDINGS: Nondisplaced fractures are seen involving the right lateral 8th and 9th ribs. There is no evidence of pneumothorax or pleural effusion. Both lungs are clear. Heart size and mediastinal contours are within normal limits. IMPRESSION: Nondisplaced fractures of right lateral 8th and 9th ribs. No evidence of pneumothorax or hemothorax. No active cardiopulmonary  disease. Electronically Signed   By: Marlaine Hind M.D.   On: 05/09/2022 15:19    PROCEDURES:  Critical Care performed: No  Procedures   MEDICATIONS ORDERED IN ED: Medications  meloxicam (MOBIC) tablet 15 mg (has no administration in time range)  HYDROcodone-acetaminophen (NORCO/VICODIN) 5-325 MG per tablet 1 tablet (has no administration in time range)     IMPRESSION / MDM / ASSESSMENT AND PLAN / ED COURSE  I reviewed the triage vital signs and the nursing notes.                                 Differential diagnosis includes, but is not limited to, rib fracture, rib contusion, pneumothorax  Patient's presentation is most consistent with  acute presentation with potential threat to life or bodily function.   Patient's diagnosis is consistent with rib fractures.  Patient presents emergency department with pain in the right ribs after mechanical fall.  She fell and hit her right chest wall.  Patient has tenderness over the lateral ribs.  No palpable abnormality.  X-ray revealed 2 nondisplaced rib fractures.  No evidence of underlying pneumothorax.  Patient will have anti-inflammatory and limited pain medication for symptom relief.  Follow-up with primary care as needed.  Patient is given ED precautions to return to the ED for any worsening or new symptoms.     FINAL CLINICAL IMPRESSION(S) / ED DIAGNOSES   Final diagnoses:  Fall, initial encounter  Closed fracture of multiple ribs of right side, initial encounter     Rx / DC Orders   ED Discharge Orders          Ordered    meloxicam (MOBIC) 15 MG tablet  Daily        05/09/22 1633    HYDROcodone-acetaminophen (NORCO/VICODIN) 5-325 MG tablet  Every 4 hours PRN        05/09/22 1633             Note:  This document was prepared using Dragon voice recognition software and may include unintentional dictation errors.   Darletta Moll, PA-C 05/09/22 1633    Naaman Plummer, MD 05/09/22 Karl Bales

## 2022-05-09 NOTE — ED Notes (Signed)
Pt states she has been taking tylenol extra strength for pain, last dose 0400.

## 2022-09-24 ENCOUNTER — Emergency Department: Payer: Medicaid Other

## 2022-09-24 ENCOUNTER — Emergency Department
Admission: EM | Admit: 2022-09-24 | Discharge: 2022-09-24 | Disposition: A | Discharge status: Home / Self Care | Payer: Medicaid Other | Source: Home / Self Care | Attending: Emergency Medicine | Admitting: Emergency Medicine

## 2022-09-24 ENCOUNTER — Other Ambulatory Visit: Payer: Self-pay

## 2022-09-24 DIAGNOSIS — M7989 Other specified soft tissue disorders: Secondary | ICD-10-CM | POA: Diagnosis not present

## 2022-09-24 DIAGNOSIS — S42291A Other displaced fracture of upper end of right humerus, initial encounter for closed fracture: Secondary | ICD-10-CM | POA: Diagnosis not present

## 2022-09-24 DIAGNOSIS — W010XXA Fall on same level from slipping, tripping and stumbling without subsequent striking against object, initial encounter: Secondary | ICD-10-CM | POA: Diagnosis not present

## 2022-09-24 DIAGNOSIS — Y92007 Garden or yard of unspecified non-institutional (private) residence as the place of occurrence of the external cause: Secondary | ICD-10-CM | POA: Diagnosis not present

## 2022-09-24 DIAGNOSIS — Y9301 Activity, walking, marching and hiking: Secondary | ICD-10-CM | POA: Diagnosis not present

## 2022-09-24 DIAGNOSIS — S4991XA Unspecified injury of right shoulder and upper arm, initial encounter: Secondary | ICD-10-CM | POA: Diagnosis present

## 2022-09-24 MED ORDER — HYDROCODONE-ACETAMINOPHEN 7.5-325 MG/15ML PO SOLN: 15.0000 mL | Freq: Four times a day (QID) | ORAL | 0 refills | Status: DC | PRN

## 2022-09-24 MED ORDER — HYDROCODONE-ACETAMINOPHEN 5-325 MG PO TABS: 1.0000 | ORAL_TABLET | ORAL | 0 refills | Status: DC | PRN

## 2022-09-24 MED ORDER — HYDROCODONE-ACETAMINOPHEN 5-325 MG PO TABS
1.0000 | ORAL_TABLET | Freq: Once | ORAL | Status: AC
  Administered 2022-09-24: 1 via ORAL
  Filled 2022-09-24: qty 1

## 2022-09-24 NOTE — ED Provider Notes (Signed)
[facility] Provider Note  Patient Contact: 5:33 PM (approximate)   History   Fall   HPI  Claire Brown is a 62 y.o. female who presents the department after tripping and falling.  Patient states that she has been walking through her yard, there are mole tunnels and she tripped and landed causing her to fall onto an outstretched arm.  Patient did not hit her head, lose consciousness, denies any headache, neck pain, lower extremity injury.  She is here only for right shoulder injury.  Unable to move the shoulder after the this injury.  No loss of sensation.  No open wounds.     Physical Exam   Triage Vital Signs: ED Triage Vitals [09/24/22 1424]  Encounter Vitals Group     BP 115/77     Systolic BP Percentile      Diastolic BP Percentile      Pulse Rate (!) 124     Resp 18     Temp 98 F (36.7 C)     Temp Source Oral     SpO2 94 %     Weight      Height      Head Circumference      Peak Flow      Pain Score 8     Pain Loc      Pain Education      Exclude from Growth Chart     Most recent vital signs: Vitals:   09/24/22 1424 09/24/22 1715  BP: 115/77 116/83  Pulse: (!) 124 (!) 117  Resp: 18   Temp: 98 F (36.7 C)   SpO2: 94% 91%     General: Alert and in no acute distress. Head: No acute traumatic findings  Neck: No stridor. No cervical spine tenderness to palpation.  Cardiovascular:  Good peripheral perfusion Respiratory: Normal respiratory effort without tachypnea or retractions. Lungs CTAB.  Musculoskeletal: Full range of motion to all extremities.  Neurologic:  No gross focal neurologic deficits are appreciated.  Skin:   No rash noted Other:   ED Results / Procedures / Treatments   Labs (all labs ordered are listed, but only abnormal results are displayed) Labs Reviewed - No data to display   EKG     RADIOLOGY  I personally viewed, evaluated, and interpreted these images as part of my medical decision making,  as well as reviewing the written report by the radiologist.  ED Provider Interpretation: Mildly displaced comminuted proximal right humeral head and neck fracture  DG Shoulder Right  Result Date: 09/24/2022 CLINICAL DATA:  Right shoulder pain after fall. EXAM: RIGHT SHOULDER - 2+ VIEW COMPARISON:  None Available. FINDINGS: Mildly displaced and comminuted proximal right humeral head and neck fracture is noted. Visualized ribs are unremarkable. No definite dislocation is noted. IMPRESSION: Mildly displaced and comminuted proximal right humeral head and neck fracture. Electronically Signed   By: Lupita Raider M.D.   On: 09/24/2022 15:49    PROCEDURES:  Critical Care performed: No  Procedures   MEDICATIONS ORDERED IN ED: Medications  HYDROcodone-acetaminophen (NORCO/VICODIN) 5-325 MG per tablet 1 tablet (has no administration in time range)     IMPRESSION / MDM / ASSESSMENT AND PLAN / ED COURSE  I reviewed the triage vital signs and the nursing notes.                                 Differential  diagnosis includes, but is not limited to, shoulder contusion, shoulder fracture, shoulder dislocation   Patient's presentation is most consistent with acute presentation with potential threat to life or bodily function.   Patient's diagnosis is consistent with shoulder fracture.  Patient presents the emergency department after mechanical fall.  Patient is having pain to the shoulder region.  X-ray confirms that she is a surgical neck fracture of the right proximal humerus..  Patiently placed in sling, given pain medications and recommend follow-up with orthopedics.  Patient is agreeable with this plan and will follow-up with Ortho.  Patient is given ED precautions to return to the ED for any worsening or new symptoms.     FINAL CLINICAL IMPRESSION(S) / ED DIAGNOSES   Final diagnoses:  Other closed displaced fracture of proximal end of right humerus, initial encounter     Rx / DC  Orders   ED Discharge Orders          Ordered    HYDROcodone-acetaminophen (NORCO/VICODIN) 5-325 MG tablet  Every 4 hours PRN        09/24/22 1751             Note:  This document was prepared using Dragon voice recognition software and may include unintentional dictation errors.   Racheal Patches, PA-C 09/24/22 1751    Janith Lima, MD 09/26/22 [phone]

## 2022-09-24 NOTE — ED Triage Notes (Addendum)
Pt slipped in wet yard and landed w/ right arm abducted/outstretched. Pt c/o right arm/shoulder pain. Obvious swelling and deformity of right shoulder noted, capillary refill <3 seconds, pt states sensation is decreased and ROM is limited. Pt AOX4, denies other pain/injury. No blood thinners.

## 2022-09-24 NOTE — ED Notes (Addendum)
Ice pack applied to right shoulder. 

## 2022-09-24 NOTE — ED Notes (Signed)
Pt A&O x4, no obvious distress noted, respirations regular/unlabored. Pt verbalizes understanding of discharge instructions. Pt able to ambulate from ED independently.   

## 2023-07-12 ENCOUNTER — Other Ambulatory Visit: Payer: Self-pay

## 2023-07-12 ENCOUNTER — Emergency Department

## 2023-07-12 ENCOUNTER — Observation Stay
Admission: EM | Admit: 2023-07-12 | Discharge: 2023-07-14 | Disposition: A | Discharge status: Home / Self Care | Attending: Internal Medicine | Admitting: Internal Medicine

## 2023-07-12 DIAGNOSIS — F17210 Nicotine dependence, cigarettes, uncomplicated: Secondary | ICD-10-CM | POA: Insufficient documentation

## 2023-07-12 DIAGNOSIS — E876 Hypokalemia: Secondary | ICD-10-CM | POA: Diagnosis not present

## 2023-07-12 DIAGNOSIS — I700 Atherosclerosis of aorta: Secondary | ICD-10-CM | POA: Insufficient documentation

## 2023-07-12 DIAGNOSIS — Z720 Tobacco use: Secondary | ICD-10-CM | POA: Diagnosis present

## 2023-07-12 DIAGNOSIS — N3000 Acute cystitis without hematuria: Secondary | ICD-10-CM

## 2023-07-12 DIAGNOSIS — R197 Diarrhea, unspecified: Secondary | ICD-10-CM | POA: Diagnosis not present

## 2023-07-12 DIAGNOSIS — R112 Nausea with vomiting, unspecified: Secondary | ICD-10-CM | POA: Diagnosis present

## 2023-07-12 DIAGNOSIS — N390 Urinary tract infection, site not specified: Principal | ICD-10-CM | POA: Diagnosis present

## 2023-07-12 DIAGNOSIS — F1020 Alcohol dependence, uncomplicated: Secondary | ICD-10-CM | POA: Insufficient documentation

## 2023-07-12 DIAGNOSIS — Z96653 Presence of artificial knee joint, bilateral: Secondary | ICD-10-CM | POA: Insufficient documentation

## 2023-07-12 DIAGNOSIS — F1010 Alcohol abuse, uncomplicated: Secondary | ICD-10-CM | POA: Diagnosis not present

## 2023-07-12 DIAGNOSIS — Z79899 Other long term (current) drug therapy: Secondary | ICD-10-CM | POA: Diagnosis not present

## 2023-07-12 DIAGNOSIS — Z7982 Long term (current) use of aspirin: Secondary | ICD-10-CM | POA: Insufficient documentation

## 2023-07-12 DIAGNOSIS — R109 Unspecified abdominal pain: Secondary | ICD-10-CM | POA: Diagnosis not present

## 2023-07-12 DIAGNOSIS — K529 Noninfective gastroenteritis and colitis, unspecified: Secondary | ICD-10-CM

## 2023-07-12 LAB — CBC WITH DIFFERENTIAL/PLATELET
Abs Immature Granulocytes: 0.02 10*3/uL (ref 0.00–0.07)
Basophils Absolute: 0 10*3/uL (ref 0.0–0.1)
Basophils Relative: 1 %
Eosinophils Absolute: 0.1 10*3/uL (ref 0.0–0.5)
Eosinophils Relative: 1 %
HCT: 42.6 % (ref 36.0–46.0)
Hemoglobin: 15 g/dL (ref 12.0–15.0)
Immature Granulocytes: 0 %
Lymphocytes Relative: 25 %
Lymphs Abs: 2.1 10*3/uL (ref 0.7–4.0)
MCH: 36.4 pg — ABNORMAL HIGH (ref 26.0–34.0)
MCHC: 35.2 g/dL (ref 30.0–36.0)
MCV: 103.4 fL — ABNORMAL HIGH (ref 80.0–100.0)
Monocytes Absolute: 0.6 10*3/uL (ref 0.1–1.0)
Monocytes Relative: 8 %
Neutro Abs: 5.6 10*3/uL (ref 1.7–7.7)
Neutrophils Relative %: 65 %
Platelets: 367 10*3/uL (ref 150–400)
RBC: 4.12 MIL/uL (ref 3.87–5.11)
RDW: 13.3 % (ref 11.5–15.5)
WBC: 8.4 10*3/uL (ref 4.0–10.5)
nRBC: 0 % (ref 0.0–0.2)

## 2023-07-12 LAB — URINALYSIS, ROUTINE W REFLEX MICROSCOPIC
Bilirubin Urine: NEGATIVE
Glucose, UA: NEGATIVE mg/dL
Hgb urine dipstick: NEGATIVE
Ketones, ur: NEGATIVE mg/dL
Nitrite: NEGATIVE
Protein, ur: NEGATIVE mg/dL
Specific Gravity, Urine: 1.018 (ref 1.005–1.030)
pH: 7 (ref 5.0–8.0)

## 2023-07-12 LAB — COMPREHENSIVE METABOLIC PANEL WITH GFR
ALT: 31 U/L (ref 0–44)
AST: 31 U/L (ref 15–41)
Albumin: 3.2 g/dL — ABNORMAL LOW (ref 3.5–5.0)
Alkaline Phosphatase: 133 U/L — ABNORMAL HIGH (ref 38–126)
Anion gap: 12 (ref 5–15)
BUN: 7 mg/dL — ABNORMAL LOW (ref 8–23)
CO2: 22 mmol/L (ref 22–32)
Calcium: 8.9 mg/dL (ref 8.9–10.3)
Chloride: 107 mmol/L (ref 98–111)
Creatinine, Ser: 0.76 mg/dL (ref 0.44–1.00)
GFR, Estimated: >60 mL/min (ref >60)
Glucose, Bld: 114 mg/dL — ABNORMAL HIGH (ref 70–99)
Potassium: 3 mmol/L — ABNORMAL LOW (ref 3.5–5.1)
Sodium: 141 mmol/L (ref 135–145)
Total Bilirubin: 1.3 mg/dL — ABNORMAL HIGH (ref 0.0–1.2)
Total Protein: 6.5 g/dL (ref 6.5–8.1)

## 2023-07-12 LAB — PHOSPHORUS: Phosphorus: 3 mg/dL (ref 2.5–4.6)

## 2023-07-12 LAB — LIPASE, BLOOD: Lipase: 34 U/L (ref 11–51)

## 2023-07-12 LAB — MAGNESIUM: Magnesium: 1.8 mg/dL (ref 1.7–2.4)

## 2023-07-12 MED ORDER — LORAZEPAM 2 MG/ML IJ SOLN: 1.0000 mg | INTRAMUSCULAR | Status: DC | PRN

## 2023-07-12 MED ORDER — ACETAMINOPHEN 325 MG PO TABS
650.0000 mg | ORAL_TABLET | Freq: Four times a day (QID) | ORAL | Status: DC | PRN
  Administered 2023-07-13 – 2023-07-14 (×4): 650 mg via ORAL
  Filled 2023-07-12 (×4): qty 2

## 2023-07-12 MED ORDER — SODIUM CHLORIDE 0.9 % IV SOLN: INTRAVENOUS | Status: DC

## 2023-07-12 MED ORDER — IOHEXOL 300 MG/ML  SOLN
100.0000 mL | Freq: Once | INTRAMUSCULAR | Status: AC | PRN
  Administered 2023-07-12: 100 mL via INTRAVENOUS

## 2023-07-12 MED ORDER — POTASSIUM CHLORIDE CRYS ER 20 MEQ PO TBCR: 40.0000 meq | EXTENDED_RELEASE_TABLET | Freq: Once | ORAL | Status: DC

## 2023-07-12 MED ORDER — THIAMINE HCL 100 MG/ML IJ SOLN: 100.0000 mg | Freq: Every day | INTRAMUSCULAR | Status: DC

## 2023-07-12 MED ORDER — SODIUM CHLORIDE 0.9 % IV SOLN
1.0000 g | INTRAVENOUS | Status: DC
  Administered 2023-07-13: 1 g via INTRAVENOUS
  Filled 2023-07-12: qty 10

## 2023-07-12 MED ORDER — DIPHENHYDRAMINE HCL 25 MG PO TABS: 25.0000 mg | ORAL_TABLET | Freq: Four times a day (QID) | ORAL | Status: DC | PRN

## 2023-07-12 MED ORDER — LORAZEPAM 1 MG PO TABS: 1.0000 mg | ORAL_TABLET | ORAL | Status: DC | PRN

## 2023-07-12 MED ORDER — SODIUM CHLORIDE 0.9 % IV SOLN
1.0000 g | Freq: Once | INTRAVENOUS | Status: AC
  Administered 2023-07-12: 1 g via INTRAVENOUS
  Filled 2023-07-12: qty 10

## 2023-07-12 MED ORDER — LORAZEPAM 2 MG/ML IJ SOLN: 0.0000 mg | Freq: Four times a day (QID) | INTRAMUSCULAR | Status: DC

## 2023-07-12 MED ORDER — FOLIC ACID 1 MG PO TABS
1.0000 mg | ORAL_TABLET | Freq: Every day | ORAL | Status: DC
  Administered 2023-07-13 – 2023-07-14 (×2): 1 mg via ORAL
  Filled 2023-07-12 (×2): qty 1

## 2023-07-12 MED ORDER — NICOTINE 21 MG/24HR TD PT24
21.0000 mg | MEDICATED_PATCH | Freq: Every day | TRANSDERMAL | Status: DC
  Administered 2023-07-13 – 2023-07-14 (×2): 21 mg via TRANSDERMAL
  Filled 2023-07-12 (×2): qty 1

## 2023-07-12 MED ORDER — DIPHENHYDRAMINE HCL 25 MG PO CAPS: 25.0000 mg | ORAL_CAPSULE | Freq: Four times a day (QID) | ORAL | Status: DC | PRN

## 2023-07-12 MED ORDER — LORAZEPAM 2 MG/ML IJ SOLN: 0.0000 mg | Freq: Two times a day (BID) | INTRAMUSCULAR | Status: DC

## 2023-07-12 MED ORDER — THIAMINE MONONITRATE 100 MG PO TABS
100.0000 mg | ORAL_TABLET | Freq: Every day | ORAL | Status: DC
  Administered 2023-07-13 – 2023-07-14 (×2): 100 mg via ORAL
  Filled 2023-07-12 (×2): qty 1

## 2023-07-12 MED ORDER — ENOXAPARIN SODIUM 40 MG/0.4ML IJ SOSY
40.0000 mg | PREFILLED_SYRINGE | INTRAMUSCULAR | Status: DC
  Administered 2023-07-13 – 2023-07-14 (×2): 40 mg via SUBCUTANEOUS
  Filled 2023-07-12 (×2): qty 0.4

## 2023-07-12 MED ORDER — ONDANSETRON HCL 4 MG/2ML IJ SOLN
4.0000 mg | Freq: Once | INTRAMUSCULAR | Status: AC
  Administered 2023-07-12: 4 mg via INTRAVENOUS
  Filled 2023-07-12: qty 2

## 2023-07-12 MED ORDER — SODIUM CHLORIDE 0.9 % IV BOLUS: 500.0000 mL | Freq: Once | INTRAVENOUS | Status: DC

## 2023-07-12 MED ORDER — SODIUM CHLORIDE 0.9 % IV BOLUS
1000.0000 mL | Freq: Once | INTRAVENOUS | Status: AC
  Administered 2023-07-12: 1000 mL via INTRAVENOUS

## 2023-07-12 MED ORDER — POTASSIUM CHLORIDE CRYS ER 20 MEQ PO TBCR
60.0000 meq | EXTENDED_RELEASE_TABLET | Freq: Once | ORAL | Status: AC
  Administered 2023-07-13: 60 meq via ORAL
  Filled 2023-07-12: qty 3

## 2023-07-12 MED ORDER — PANTOPRAZOLE SODIUM 40 MG PO TBEC
40.0000 mg | DELAYED_RELEASE_TABLET | Freq: Every day | ORAL | Status: DC
  Administered 2023-07-13 – 2023-07-14 (×3): 40 mg via ORAL
  Filled 2023-07-12 (×3): qty 1

## 2023-07-12 MED ORDER — MORPHINE SULFATE (PF) 2 MG/ML IV SOLN: 1.0000 mg | INTRAVENOUS | Status: DC | PRN

## 2023-07-12 MED ORDER — ONDANSETRON HCL 4 MG/2ML IJ SOLN
4.0000 mg | Freq: Three times a day (TID) | INTRAMUSCULAR | Status: DC | PRN
  Administered 2023-07-13 – 2023-07-14 (×2): 4 mg via INTRAVENOUS
  Filled 2023-07-12 (×2): qty 2

## 2023-07-12 MED ORDER — ADULT MULTIVITAMIN W/MINERALS CH
1.0000 | ORAL_TABLET | Freq: Every day | ORAL | Status: DC
  Administered 2023-07-13 – 2023-07-14 (×2): 1 via ORAL
  Filled 2023-07-12 (×2): qty 1

## 2023-07-12 MED ORDER — SODIUM CHLORIDE 0.9 % IV SOLN
12.5000 mg | Freq: Once | INTRAVENOUS | Status: AC
  Administered 2023-07-12: 12.5 mg via INTRAVENOUS
  Filled 2023-07-12: qty 0.5

## 2023-07-12 NOTE — ED Triage Notes (Signed)
 Pt states that she started vomiting yesterday and has cont to vomit today, states that she has vomited 4 times today

## 2023-07-12 NOTE — ED Provider Notes (Signed)
 [facility] Provider Note    Event Date/Time   First MD Initiated Contact with Patient 07/12/23 1722     (approximate)   History   Emesis   HPI  Claire Brown is a 63 y.o. female with a history of anemia, arthritis, and bipolar disorder who presents with nausea and vomiting since yesterday, multiple episodes, nonbloody, and associated with upper abdominal pain but no diarrhea.  The patient denies any fever or chills.  She denies any significant recent alcohol use; she states she had a beer 3 days ago.  I reviewed the past medical records; the patient was admitted to the hospitalist service in 2021 with alcoholic ketoacidosis and UTI.   Physical Exam   Triage Vital Signs: ED Triage Vitals  Encounter Vitals Group     BP 07/12/23 1718 (!) 111/95     Systolic BP Percentile --      Diastolic BP Percentile --      Pulse Rate 07/12/23 1718 (!) 121     Resp 07/12/23 1718 20     Temp 07/12/23 1718 98.5 F (36.9 C)     Temp Source 07/12/23 1718 Oral     SpO2 07/12/23 1718 100 %     Weight 07/12/23 1722 110 lb (49.9 kg)     Height 07/12/23 1722 5' (1.524 m)     Head Circumference --      Peak Flow --      Pain Score 07/12/23 1721 4     Pain Loc --      Pain Education --      Exclude from Growth Chart --     Most recent vital signs: Vitals:   07/12/23 2000 07/12/23 2200  BP: 105/75 111/71  Pulse: 91 79  Resp:  18  Temp:    SpO2: 100% 98%     General: Alert, no distress.  CV:  Good peripheral perfusion.  Resp:  Normal effort.  Abd:  Soft with mild diffuse discomfort to palpation.  No distention.  Other:  No jaundice or scleral icterus.  Dry mucous membranes.   ED Results / Procedures / Treatments   Labs (all labs ordered are listed, but only abnormal results are displayed) Labs Reviewed  COMPREHENSIVE METABOLIC PANEL WITH GFR - Abnormal; Notable for the following components:      Result Value   Potassium 3.0 (*)    Glucose, Bld  114 (*)    BUN 7 (*)    Albumin 3.2 (*)    Alkaline Phosphatase 133 (*)    Total Bilirubin 1.3 (*)    All other components within normal limits  CBC WITH DIFFERENTIAL/PLATELET - Abnormal; Notable for the following components:   MCV 103.4 (*)    MCH 36.4 (*)    All other components within normal limits  URINALYSIS, ROUTINE W REFLEX MICROSCOPIC - Abnormal; Notable for the following components:   Color, Urine YELLOW (*)    APPearance CLOUDY (*)    Leukocytes,Ua SMALL (*)    Bacteria, UA RARE (*)    All other components within normal limits  URINE CULTURE  LIPASE, BLOOD  MAGNESIUM  PHOSPHORUS  HIV ANTIBODY (ROUTINE TESTING W REFLEX)  BASIC METABOLIC PANEL WITH GFR  CBC     EKG     RADIOLOGY  CT abdomen/pelvis: I independently viewed and interpreted the images; there are no dilated bowel loops or any free air or free fluid.  Radiology report indicates the following:  IMPRESSION:  1. Fluid  throughout the colon with mild enhancement of the sigmoid  colon and equivocal wall thickening distally. Findings are  suggestive of infectious or inflammatory colitis.  2. Moderate wall thickening of the distal esophagus, can be seen  with esophagitis or reflux.  3. Mild diffuse bladder wall thickening, recommend correlation with  urinalysis to exclude cystitis.  4. Mild hepatic steatosis.    Aortic Atherosclerosis (ICD10-I70.0).    PROCEDURES:  Critical Care performed: No  Procedures   MEDICATIONS ORDERED IN ED: Medications  cefTRIAXone  (ROCEPHIN ) 1 g in sodium chloride  0.9 % 100 mL IVPB (1 g Intravenous New Bag/Given 07/12/23 2249)  cefTRIAXone  (ROCEPHIN ) 1 g in sodium chloride  0.9 % 100 mL IVPB (has no administration in time range)  ondansetron  (ZOFRAN ) injection 4 mg (has no administration in time range)  acetaminophen  (TYLENOL ) tablet 650 mg (has no administration in time range)  nicotine (NICODERM CQ - dosed in mg/24 hours) patch 21 mg (has no administration in time  range)  potassium chloride  SA (KLOR-CON  M) CR tablet 60 mEq (has no administration in time range)  pantoprazole (PROTONIX) EC tablet 40 mg (has no administration in time range)  LORazepam  (ATIVAN ) tablet 1-4 mg (has no administration in time range)    Or  LORazepam  (ATIVAN ) injection 1-4 mg (has no administration in time range)  thiamine  (VITAMIN B1) tablet 100 mg (has no administration in time range)    Or  thiamine  (VITAMIN B1) injection 100 mg (has no administration in time range)  folic acid  (FOLVITE ) tablet 1 mg (has no administration in time range)  multivitamin with minerals tablet 1 tablet (has no administration in time range)  LORazepam  (ATIVAN ) injection 0-4 mg (has no administration in time range)    Followed by  LORazepam  (ATIVAN ) injection 0-4 mg (has no administration in time range)  enoxaparin  (LOVENOX ) injection 40 mg (has no administration in time range)  ondansetron  (ZOFRAN ) injection 4 mg (4 mg Intravenous Given 07/12/23 1748)  sodium chloride  0.9 % bolus 1,000 mL (0 mLs Intravenous Stopped 07/12/23 2003)  iohexol  (OMNIPAQUE ) 300 MG/ML solution 100 mL (100 mLs Intravenous Contrast Given 07/12/23 1835)  promethazine  (PHENERGAN ) 12.5 mg in sodium chloride  0.9 % 50 mL IVPB (0 mg Intravenous Stopped 07/12/23 2213)     IMPRESSION / MDM / ASSESSMENT AND PLAN / ED COURSE  I reviewed the triage vital signs and the nursing notes.  63 year old female with PMH as noted above presents with nausea and vomiting since yesterday associated with diffuse abdominal pain.  On exam the patient is overall relatively well-appearing.  She is tachycardic with otherwise normal vital signs.  Mucous membranes are dry.  The abdomen is soft with mild diffuse tenderness.  Differential diagnosis includes, but is not limited to, gastroenteritis, foodborne illness, gastritis, gastritis, gastroparesis, pancreatitis, other hepatobiliary cause, less likely colitis, diverticulitis.  I doubt alcoholic  ketoacidosis or other alcohol complications the patient reports only a small amount of alcohol in the last several days.  We will obtain lab workup, CT abdomen/pelvis, give fluids, antiemetics, and reassess.  Patient's presentation is most consistent with acute complicated illness / injury requiring diagnostic workup.  The patient is on the cardiac monitor to evaluate for evidence of arrhythmia and/or significant heart rate changes.  ----------------------------------------- 11:05 PM on 07/12/2023 -----------------------------------------  CT shows evidence of colitis.  Urinalysis and CT are also consistent with UTI/cystitis.  Other lab workup is unremarkable.  CBC shows no leukocytosis.  CMP shows normal electrolytes.  However, the patient has had refractory nausea and  vomiting despite Zofran  and subsequently Phenergan .  She will need antibiotics for the UTI and currently is not able to tolerate p.o.  She will therefore need admission.  I consulted Dr. Rosalea Collin from the hospitalist service; based on our discussion he agrees to evaluate the patient for admission.    FINAL CLINICAL IMPRESSION(S) / ED DIAGNOSES   Final diagnoses:  Urinary tract infection without hematuria, site unspecified  Colitis  Nausea and vomiting, unspecified vomiting type     Rx / DC Orders   ED Discharge Orders     None        Note:  This document was prepared using Dragon voice recognition software and may include unintentional dictation errors.    Lind Repine, MD 07/12/23 2306

## 2023-07-12 NOTE — H&P (Signed)
 History and Physical    Claire Brown:[id number] DOB: 06/19/1960 DOA: 07/12/2023  Referring MD/NP/PA:   PCP: Pcp, No   Patient coming from:  The patient is coming from home.     Chief Complaint: Increased urinary frequency, nausea, vomiting, diarrhea, abdominal pain  HPI: Claire Brown is a 63 y.o. female with medical history significant of tobacco abuse, alcohol use, alcoholic ketoacidosis, anemia, anxiety, bipolar, who presents with increased urinary frequency, nausea, vomiting, diarrhea.  Patient states that her symptoms started yesterday, including nausea, vomiting, diarrhea and abdominal pain.  She also has increased urinary frequency, feeloing pressure in the suprapubic area when urinating.  No dysuria or burning with urination.  No fever or chills.  She has had multiple episodes of nonbilious nonbloody vomiting and 6 times of watery diarrhea.  She has mild middle abdominal pain, which is constant, aching, nonradiating, not aggravated or alleviated by any known factors.  No chest pain, cough, SOB.   Data reviewed independently and ED Course: pt was found to have UA (cloudy appearance, small amount leukocyte, rare bacteria, WBC 250, squamous cell 21-50), WBC 8.4, potassium 3.0, magnesium 1.8, phosphorus 3.0, GFR> 60, lipase 34.  Temperature normal, blood pressure 111/71, heart rate 121, RR 20, oxygen saturation 98% on room air.  Patient is placed in MedSurg bed for placement.  CT of abdomen/pelvis: 1. Fluid throughout the colon with mild enhancement of the sigmoid colon and equivocal wall thickening distally. Findings are suggestive of infectious or inflammatory colitis. 2. Moderate wall thickening of the distal esophagus, can be seen with esophagitis or reflux. 3. Mild diffuse bladder wall thickening, recommend correlation with urinalysis to exclude cystitis. 4. Mild hepatic steatosis.   Aortic Atherosclerosis (ICD10-I70.0).    EKG:  Not done in ED, will get one.     Review of Systems:   General: no fevers, chills, no body weight gain, has poor appetite, has fatigue HEENT: no blurry vision, hearing changes or sore throat Respiratory: no dyspnea, coughing, wheezing CV: no chest pain, no palpitations GI: has nausea, vomiting, abdominal pain, diarrhea GU: no dysuria, burning on urination, has increased urinary frequency, no hematuria  Ext: no leg edema Neuro: no unilateral weakness, numbness, or tingling, no vision change or hearing loss Skin: no rash, no skin tear. MSK: No muscle spasm, no deformity, no limitation of range of movement in spin Heme: No easy bruising.  Travel history: No recent long distant travel.   Allergy:  Allergies  Allergen Reactions   Amoxicillin-Pot Clavulanate Nausea Only and Nausea And Vomiting   Oxycodone  Anxiety, Other (See Comments) and Dermatitis    Reaction: jittery/ unable to sleep   Penicillins Itching, Other (See Comments) and Hives    Patient cannot remember the specifics of the reaction. Has taken Amoxicillin since with no problem.   Diazepam Anxiety, Other (See Comments) and Nausea Only    Reaction: unable to sleep    Past Medical History:  Diagnosis Date   Anemia    Anxiety    Arthritis    Bipolar disorder (HCC)    Headache     Past Surgical History:  Procedure Laterality Date   CESAREAN SECTION     CYSTOSCOPY N/A 10/14/2015   Procedure: CYSTOSCOPY;  Surgeon: Darl Edu, MD;  Location: [facility];  Service: Gynecology;  Laterality: N/A;   FRACTURE SURGERY Left 12/2015   Femoral Neck Pin Placement - Dr. Angel Kelch ([facility])   KNEE SURGERY Bilateral    LAPAROSCOPIC BILATERAL SALPINGO OOPHERECTOMY  10/14/2015  Procedure: LAPAROSCOPIC BILATERAL SALPINGO OOPHORECTOMY;  Surgeon: Darl Edu, MD;  Location: [facility];  Service: Gynecology;;   LAPAROSCOPIC HYSTERECTOMY N/A 10/14/2015   Procedure: HYSTERECTOMY TOTAL LAPAROSCOPIC;  Surgeon: Darl Edu, MD;  Location: [facility];  Service:  Gynecology;  Laterality: N/A;    Social History:  reports that she has been smoking. She has never used smokeless tobacco. She reports current alcohol use. She reports that she does not use drugs.  Family History:  Family History  Problem Relation Age of Onset   Cancer Brother        loyphoma     Prior to Admission medications   Medication Sig Start Date End Date Taking? Authorizing Provider  aspirin  325 MG tablet Take 325 mg by mouth daily as needed.     [provider]  Calcium Carbonate-Vitamin D 600-400 MG-UNIT tablet Take 1 tablet by mouth 2 (two) times daily.    [provider]  diphenhydrAMINE  (BENADRYL ) 25 MG tablet Take 25 mg by mouth every 6 (six) hours as needed for allergies.    [provider]  ferrous gluconate (FERGON) 324 MG tablet Take 324 mg by mouth daily.    [provider]  HYDROcodone -acetaminophen  (HYCET) 7.5-325 mg/15 ml solution Take 15 mLs by mouth 4 (four) times daily as needed for moderate pain. 09/24/22 09/24/23  Delsie Figures, PA-C  Multiple Vitamin (MULTIVITAMIN WITH MINERALS) TABS tablet Take 1 tablet by mouth daily.    [provider]  Vitamin D, Ergocalciferol, (DRISDOL) 50000 units CAPS capsule Take 1 capsule by mouth once a week. Patient takes on Saturday 01/17/16   [provider]    Physical Exam: Vitals:   07/13/23 0033 07/13/23 0039 07/13/23 0056 07/13/23 0100  BP:  113/84 106/78   Pulse:  78 83   Resp:   20   Temp: 98.5 F (36.9 C)  98.3 F (36.8 C)   TempSrc: Oral  Oral   SpO2:  100% 99% 99%  Weight:      Height:       General: Not in acute distress.  Dry mucous membrane HEENT:       Eyes: PERRL, EOMI, no jaundice       ENT: No discharge from the ears and nose, no pharynx injection, no tonsillar enlargement.        Neck: No JVD, no bruit, no mass felt. Heme: No neck lymph node enlargement. Cardiac: S1/S2, RRR, No murmurs, No gallops or rubs. Respiratory: No rales, wheezing, rhonchi  or rubs. GI: Soft, nondistended, has mild central abdominal tenderness, no rebound pain, no organomegaly, BS present. GU: No hematuria Ext: No pitting leg edema bilaterally. 1+DP/PT pulse bilaterally. Musculoskeletal: No joint deformities, No joint redness or warmth, no limitation of ROM in spin. Skin: No rashes.  Neuro: Alert, oriented X3, cranial nerves II-XII grossly intact, moves all extremities normally.  Psych: Patient is not psychotic, no suicidal or hemocidal ideation.  Labs on Admission: I have personally reviewed following labs and imaging studies  CBC: Recent Labs  Lab 07/12/23 1738  WBC 8.4  NEUTROABS 5.6  HGB 15.0  HCT 42.6  MCV 103.4*  PLT 367   Basic Metabolic Panel: Recent Labs  Lab 07/12/23 1738  NA 141  K 3.0*  CL 107  CO2 22  GLUCOSE 114*  BUN 7*  CREATININE 0.76  CALCIUM 8.9  MG 1.8  PHOS 3.0   GFR: Estimated Creatinine Clearance: 53 mL/min (by C-G formula based on SCr of 0.76 mg/dL). Liver Function  Tests: Recent Labs  Lab 07/12/23 1738  AST 31  ALT 31  ALKPHOS 133*  BILITOT 1.3*  PROT 6.5  ALBUMIN 3.2*   Recent Labs  Lab 07/12/23 1738  LIPASE 34   No results for input(s): "AMMONIA" in the last 168 hours. Coagulation Profile: No results for input(s): "INR", "PROTIME" in the last 168 hours. Cardiac Enzymes: No results for input(s): "CKTOTAL", "CKMB", "CKMBINDEX", "TROPONINI" in the last 168 hours. BNP (last 3 results) No results for input(s): "PROBNP" in the last 8760 hours. HbA1C: No results for input(s): "HGBA1C" in the last 72 hours. CBG: No results for input(s): "GLUCAP" in the last 168 hours. Lipid Profile: No results for input(s): "CHOL", "HDL", "LDLCALC", "TRIG", "CHOLHDL", "LDLDIRECT" in the last 72 hours. Thyroid Function Tests: No results for input(s): "TSH", "T4TOTAL", "FREET4", "T3FREE", "THYROIDAB" in the last 72 hours. Anemia Panel: No results for input(s): "VITAMINB12", "FOLATE", "FERRITIN", "TIBC", "IRON",  "RETICCTPCT" in the last 72 hours. Urine analysis:    Component Value Date/Time   COLORURINE YELLOW (A) 07/12/2023 1742   APPEARANCEUR CLOUDY (A) 07/12/2023 1742   LABSPEC 1.018 07/12/2023 1742   PHURINE 7.0 07/12/2023 1742   GLUCOSEU NEGATIVE 07/12/2023 1742   HGBUR NEGATIVE 07/12/2023 1742   BILIRUBINUR NEGATIVE 07/12/2023 1742   KETONESUR NEGATIVE 07/12/2023 1742   PROTEINUR NEGATIVE 07/12/2023 1742   NITRITE NEGATIVE 07/12/2023 1742   LEUKOCYTESUR SMALL (A) 07/12/2023 1742   Sepsis Labs: @LABRCNTIP (procalcitonin:4,lacticidven:4) )No results found for this or any previous visit (from the past 240 hours).   Radiological Exams on Admission:   Assessment/Plan Principal Problem:   UTI (urinary tract infection) Active Problems:   Nausea vomiting and diarrhea   Alcohol use disorder, mild, abuse   Tobacco abuse   Hypokalemia   Assessment and Plan:  UTI (urinary tract infection): -place in MedSurg bed for observation - IV Rocephin  - Follow-up urine culture - IV fluid: 1.5 L normal saline, then 75 cc/h  Nausea vomiting and diarrhea and abdominal pain: Possibly due to viral gastroenteritis.  CT scan showed possible colitis, but patient does not have fever or leukocytosis, low suspicions for bacterial colitis. -Follow-up C. difficile and GI pathogen panel. - Started Protonix empirically - IV fluid as above - As needed Zofran  and morphine   Tobacco abuse and alcohol use disorder, mild, abuse - Data counseling about importance of quitting smoking and alcohol use - Nicotine patch - CIWA protocol  Hypokalemia: Potassium 3.0.  Magnesium 1.8, phosphorus 3.0. - Repleted potassium.       DVT ppx: SQ Lovenox   Code Status: Full code    Family Communication:     not done, no family member is at bed side.     Disposition Plan:  Anticipate discharge back to previous environment  Consults called:  none  Admission status and Level of care: Med-Surg:    for obs as inpt         Dispo: The patient is from: Home              Anticipated d/c is to: Home              Anticipated d/c date is: 1 day              Patient currently is not medically stable to d/c.    Severity of Illness:  The appropriate patient status for this patient is OBSERVATION. Observation status is judged to be reasonable and necessary in order to provide the required intensity of service to ensure the patient's  safety. The patient's presenting symptoms, physical exam findings, and initial radiographic and laboratory data in the context of their medical condition is felt to place them at decreased risk for further clinical deterioration. Furthermore, it is anticipated that the patient will be medically stable for discharge from the hospital within 2 midnights of admission.        Date of Service 07/13/2023    Fidencio Hue [facility]   If 7PM-7AM, please contact night-coverage [email or web address] 07/13/2023, 1:24 AM

## 2023-07-13 ENCOUNTER — Encounter: Payer: Self-pay | Admitting: Internal Medicine

## 2023-07-13 DIAGNOSIS — N3000 Acute cystitis without hematuria: Secondary | ICD-10-CM | POA: Diagnosis not present

## 2023-07-13 DIAGNOSIS — E876 Hypokalemia: Secondary | ICD-10-CM | POA: Diagnosis not present

## 2023-07-13 DIAGNOSIS — R197 Diarrhea, unspecified: Secondary | ICD-10-CM | POA: Diagnosis not present

## 2023-07-13 DIAGNOSIS — Z720 Tobacco use: Secondary | ICD-10-CM | POA: Diagnosis present

## 2023-07-13 DIAGNOSIS — R112 Nausea with vomiting, unspecified: Secondary | ICD-10-CM | POA: Diagnosis not present

## 2023-07-13 LAB — CBC
HCT: 34.2 % — ABNORMAL LOW (ref 36.0–46.0)
Hemoglobin: 12.3 g/dL (ref 12.0–15.0)
MCH: 36.9 pg — ABNORMAL HIGH (ref 26.0–34.0)
MCHC: 36 g/dL (ref 30.0–36.0)
MCV: 102.7 fL — ABNORMAL HIGH (ref 80.0–100.0)
Platelets: 279 10*3/uL (ref 150–400)
RBC: 3.33 MIL/uL — ABNORMAL LOW (ref 3.87–5.11)
RDW: 13.4 % (ref 11.5–15.5)
WBC: 7 10*3/uL (ref 4.0–10.5)
nRBC: 0 % (ref 0.0–0.2)

## 2023-07-13 LAB — MAGNESIUM: Magnesium: 1.6 mg/dL — ABNORMAL LOW (ref 1.7–2.4)

## 2023-07-13 LAB — BASIC METABOLIC PANEL WITH GFR
Anion gap: 9 (ref 5–15)
BUN: 6 mg/dL — ABNORMAL LOW (ref 8–23)
CO2: 21 mmol/L — ABNORMAL LOW (ref 22–32)
Calcium: 7.8 mg/dL — ABNORMAL LOW (ref 8.9–10.3)
Chloride: 111 mmol/L (ref 98–111)
Creatinine, Ser: 0.63 mg/dL (ref 0.44–1.00)
GFR, Estimated: >60 mL/min (ref >60)
Glucose, Bld: 84 mg/dL (ref 70–99)
Potassium: 2.8 mmol/L — ABNORMAL LOW (ref 3.5–5.1)
Sodium: 141 mmol/L (ref 135–145)

## 2023-07-13 LAB — PHOSPHORUS: Phosphorus: 3 mg/dL (ref 2.5–4.6)

## 2023-07-13 LAB — HIV ANTIBODY (ROUTINE TESTING W REFLEX): HIV Screen 4th Generation wRfx: NONREACTIVE

## 2023-07-13 LAB — TSH: TSH: 0.982 u[IU]/mL (ref 0.350–4.500)

## 2023-07-13 MED ORDER — MAGNESIUM SULFATE 2 GM/50ML IV SOLN
2.0000 g | Freq: Once | INTRAVENOUS | Status: AC
  Administered 2023-07-13: 2 g via INTRAVENOUS
  Filled 2023-07-13: qty 50

## 2023-07-13 MED ORDER — POTASSIUM CHLORIDE CRYS ER 20 MEQ PO TBCR
40.0000 meq | EXTENDED_RELEASE_TABLET | ORAL | Status: AC
  Administered 2023-07-13 (×2): 40 meq via ORAL
  Filled 2023-07-13 (×2): qty 2

## 2023-07-13 MED ORDER — POTASSIUM CHLORIDE 10 MEQ/100ML IV SOLN
10.0000 meq | INTRAVENOUS | Status: DC
  Administered 2023-07-13: 10 meq via INTRAVENOUS
  Filled 2023-07-13: qty 100

## 2023-07-13 NOTE — Plan of Care (Signed)
   Problem: Education: Goal: Knowledge of General Education information will improve Description Including pain rating scale, medication(s)/side effects and non-pharmacologic comfort measures Outcome: Progressing

## 2023-07-13 NOTE — Plan of Care (Signed)

## 2023-07-13 NOTE — Hospital Course (Signed)
 Claire Brown is a 63 y.o. female with medical history significant of tobacco abuse, alcohol use, alcoholic ketoacidosis, anemia, anxiety, bipolar, who presents with increased urinary frequency, nausea, vomiting, diarrhea.  Placed on Rocephin  for UTI, urine culture sent out.

## 2023-07-13 NOTE — Progress Notes (Signed)
  Progress Note   Patient: Claire Brown JYN:[id number] DOB: 07/04/1960 DOA: 07/12/2023     0 DOS: the patient was seen and examined on 07/13/2023   Brief hospital course: MARGIE URBANOWICZ is a 63 y.o. female with medical history significant of tobacco abuse, alcohol use, alcoholic ketoacidosis, anemia, anxiety, bipolar, who presents with increased urinary frequency, nausea, vomiting, diarrhea.  Placed on Rocephin  for UTI, urine culture sent out.   Principal Problem:   UTI (urinary tract infection) Active Problems:   Nausea vomiting and diarrhea   Alcohol use disorder, mild, abuse   Tobacco abuse   Hypokalemia   Hypomagnesemia   Assessment and Plan: UTI (urinary tract infection): Patient received IV fluids, does not meet criteria for sepsis. Continue Rocephin  while pending urine culture results.   Nausea vomiting and diarrhea and abdominal pain Hypokalemia. Hypomagnesemia. : Possibly due to viral gastroenteritis.  CT scan showed possible colitis, but patient does not have fever or leukocytosis, low suspicions for bacterial colitis. Diarrhea has resolved today, not able to send out stool study.  Will discontinue precautions. Potassium dropped down to 2.8, patient not able to tolerate IV potassium, will give oral KCl 74M EQ x 4.  Also replete magnesium   Tobacco abuse and alcohol use disorder, mild, abuse Patient has no evidence of alcohol withdrawal.  Continue to follow.         Subjective:  Patient still has some occasional nausea but has good appetite. Diarrhea resolved.  Physical Exam: Vitals:   07/13/23 0039 07/13/23 0056 07/13/23 0100 07/13/23 0812  BP: 113/84 106/78  115/83  Pulse: 78 83  77  Resp:  20  16  Temp:  98.3 F (36.8 C)  98.6 F (37 C)  TempSrc:  Oral    SpO2: 100% 99% 99% 97%  Weight:      Height:       General exam: Appears calm and comfortable  Respiratory system: Clear to auscultation. Respiratory effort normal. Cardiovascular system:  S1 & S2 heard, RRR. No JVD, murmurs, rubs, gallops or clicks. No pedal edema. Gastrointestinal system: Abdomen is nondistended, soft and nontender. No organomegaly or masses felt. Normal bowel sounds heard. Central nervous system: Alert and oriented. No focal neurological deficits. Extremities: Symmetric 5 x 5 power. Skin: No rashes, lesions or ulcers Psychiatry: Judgement and insight appear normal. Mood & affect appropriate.    Data Reviewed:  CT scan results and lab results reviewed.  Family Communication: None  Disposition: Status is: Observation      Time spent: 35 minutes  Author: Donaciano Frizzle, MD 07/13/2023 12:30 PM  For on call review [email or web address].

## 2023-07-13 NOTE — Progress Notes (Signed)
 Patient arrived on unit  in stable condition, alert and verbally responsive. Ambulating with little assistance to bathroom and in room. Complained of pain to right shoulder PRN Tylenol  given. Skin intact.

## 2023-07-14 DIAGNOSIS — N3000 Acute cystitis without hematuria: Secondary | ICD-10-CM | POA: Diagnosis not present

## 2023-07-14 DIAGNOSIS — R197 Diarrhea, unspecified: Secondary | ICD-10-CM | POA: Diagnosis not present

## 2023-07-14 DIAGNOSIS — R112 Nausea with vomiting, unspecified: Secondary | ICD-10-CM | POA: Diagnosis not present

## 2023-07-14 DIAGNOSIS — E876 Hypokalemia: Secondary | ICD-10-CM | POA: Diagnosis not present

## 2023-07-14 LAB — BASIC METABOLIC PANEL WITH GFR
Anion gap: 5 (ref 5–15)
BUN: 6 mg/dL — ABNORMAL LOW (ref 8–23)
CO2: 23 mmol/L (ref 22–32)
Calcium: 8.1 mg/dL — ABNORMAL LOW (ref 8.9–10.3)
Chloride: 112 mmol/L — ABNORMAL HIGH (ref 98–111)
Creatinine, Ser: 0.55 mg/dL (ref 0.44–1.00)
GFR, Estimated: >60 mL/min (ref >60)
Glucose, Bld: 80 mg/dL (ref 70–99)
Potassium: 2.9 mmol/L — ABNORMAL LOW (ref 3.5–5.1)
Sodium: 140 mmol/L (ref 135–145)

## 2023-07-14 LAB — POTASSIUM: Potassium: 4.7 mmol/L (ref 3.5–5.1)

## 2023-07-14 LAB — MAGNESIUM: Magnesium: 2.1 mg/dL (ref 1.7–2.4)

## 2023-07-14 MED ORDER — POTASSIUM CHLORIDE CRYS ER 20 MEQ PO TBCR
40.0000 meq | EXTENDED_RELEASE_TABLET | ORAL | Status: AC
  Administered 2023-07-14 (×4): 40 meq via ORAL
  Filled 2023-07-14 (×4): qty 2

## 2023-07-14 MED ORDER — FOSFOMYCIN TROMETHAMINE 3 G PO PACK
3.0000 g | PACK | Freq: Once | ORAL | Status: AC
  Administered 2023-07-14: 3 g via ORAL
  Filled 2023-07-14: qty 3

## 2023-07-14 NOTE — Discharge Summary (Signed)
 Physician Discharge Summary   Patient: Claire Brown MRN: [id number] DOB: 1960/10/17  Admit date:     07/12/2023  Discharge date: 07/14/23  Discharge Physician: Donaciano Frizzle   PCP: Pcp, No   Recommendations at discharge:   Follow-up with PCP in 1 week, TOC to set up.  Discharge Diagnoses: Principal Problem:   UTI (urinary tract infection) Active Problems:   Nausea vomiting and diarrhea   Alcohol use disorder, mild, abuse   Tobacco abuse   Hypokalemia   Hypomagnesemia  Resolved Problems:   * No resolved hospital problems. *  Hospital Course: Claire Brown is a 63 y.o. female with medical history significant of tobacco abuse, alcohol use, alcoholic ketoacidosis, anemia, anxiety, bipolar, who presents with increased urinary frequency, nausea, vomiting, diarrhea.  Placed on Rocephin  for UTI, urine culture sent out, grew E Coli. Patient does not have sepsis, will give a dose of fosfomycin to complete course.   Assessment and Plan: UTI (urinary tract infection) secondary to UTI. Patient received IV fluids, does not meet criteria for sepsis. Patient is treated with Rocephin , urine culture grew E. coli.  Will complete the course with a dose of fosfomycin before discharge.   Nausea vomiting and diarrhea and abdominal pain Hypokalemia. Hypomagnesemia. : Possibly due to viral gastroenteritis.  CT scan showed possible colitis, but patient does not have fever or leukocytosis, low suspicions for bacterial colitis. Diarrhea has resolved today, not able to send out stool study.  Potassium dropped down to 2.8, received multiple doses of potassium, potassium had normalized afterwards.  She also received magnesium, level has normalized.   Tobacco abuse and alcohol use disorder, mild, abuse Patient has no evidence of alcohol withdrawal.  Advised to quit.          Consultants: None Procedures performed: None  Disposition: Home Diet recommendation:  Discharge Diet Orders (From  admission, onward)     Start     Ordered   07/14/23 0000  Diet - low sodium heart healthy        07/14/23 1602           Cardiac diet DISCHARGE MEDICATION: Allergies as of 07/14/2023       Reactions   Amoxicillin-pot Clavulanate Nausea Only, Nausea And Vomiting   Oxycodone  Anxiety, Other (See Comments), Dermatitis   Reaction: jittery/ unable to sleep   Penicillins Itching, Other (See Comments), Hives   Patient cannot remember the specifics of the reaction. Has taken Amoxicillin since with no problem.   Diazepam Anxiety, Other (See Comments), Nausea Only   Reaction: unable to sleep        Medication List     TAKE these medications    dimenhyDRINATE 50 MG tablet Commonly known as: DRAMAMINE Take 50 mg by mouth every 8 (eight) hours as needed.   diphenhydrAMINE  25 MG tablet Commonly known as: BENADRYL  Take 25 mg by mouth every 6 (six) hours as needed for allergies.   ibuprofen  200 MG tablet Commonly known as: ADVIL  Take 400 mg by mouth every 6 (six) hours as needed for headache or mild pain (pain score 1-3).        Discharge Exam: Filed Weights   07/12/23 1722  Weight: 49.9 kg   General exam: Appears calm and comfortable  Respiratory system: Clear to auscultation. Respiratory effort normal. Cardiovascular system: S1 & S2 heard, RRR. No JVD, murmurs, rubs, gallops or clicks. No pedal edema. Gastrointestinal system: Abdomen is nondistended, soft and nontender. No organomegaly or masses felt. Normal bowel sounds  heard. Central nervous system: Alert and oriented. No focal neurological deficits. Extremities: Symmetric 5 x 5 power. Skin: No rashes, lesions or ulcers Psychiatry: Judgement and insight appear normal. Mood & affect appropriate.    Condition at discharge: good  The results of significant diagnostics from this hospitalization (including imaging, microbiology, ancillary and laboratory) are listed below for reference.   Imaging Studies: CT ABDOMEN  PELVIS W CONTRAST Result Date: 07/12/2023 CLINICAL DATA:  Acute abdominal pain.  Vomiting. EXAM: CT ABDOMEN AND PELVIS WITH CONTRAST TECHNIQUE: Multidetector CT imaging of the abdomen and pelvis was performed using the standard protocol following bolus administration of intravenous contrast. RADIATION DOSE REDUCTION: This exam was performed according to the departmental dose-optimization program which includes automated exposure control, adjustment of the mA and/or kV according to patient size and/or use of iterative reconstruction technique. CONTRAST:  OMNIPAQUE  IOHEXOL  300 MG/ML  SOLN COMPARISON:  CT 09/03/2015 FINDINGS: Lower chest: Subsegmental right lung base atelectasis. No pleural effusion. Hepatobiliary: Mild diffuse hepatic steatosis. No evidence of focal liver lesion. Gallbladder physiologically distended, no calcified stone. No biliary dilatation. Pancreas: Mild parenchymal atrophy. No ductal dilatation or inflammation. Spleen: Normal in size without focal abnormality. Adrenals/Urinary Tract: No adrenal nodule. No hydronephrosis or perinephric edema. Homogeneous renal enhancement with symmetric excretion on delayed phase imaging. No suspicious renal abnormality. Mild diffuse bladder wall thickening. Stomach/Bowel: Moderate wall thickening of the distal esophagus. No small bowel dilatation or evidence of obstruction. Scattered fluid within nondilated small bowel in the pelvis. The appendix is normal. There is fluid throughout the colon. Mild enhancement of the sigmoid colon with equivocal wall thickening distally. Mild sigmoid pericolonic edema. Vascular/Lymphatic: Aortic atherosclerosis without aneurysm. The portal vein is patent. No acute vascular findings. No enlarged lymph nodes in the abdomen or pelvis. Reproductive: Status post hysterectomy. No adnexal masses. Other: No free air or ascites. Diminutive fat containing umbilical hernia. Musculoskeletal: 2 screws traverse the left proximal femur.  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 This could be further assessed with nonemergent MRI as clinically indicated. Scoliosis and degenerative change in the lumbar spine. Remote right rib fractures. IMPRESSION: 1. Fluid throughout the colon with mild enhancement of the sigmoid colon and equivocal wall thickening distally. Findings are suggestive of infectious or inflammatory colitis. 2. Moderate wall thickening of the distal esophagus, can be seen with esophagitis or reflux. 3. Mild diffuse bladder wall thickening, recommend correlation with urinalysis to exclude cystitis. 4. Mild hepatic steatosis. Aortic Atherosclerosis (ICD10-I70.0). Electronically Signed   By: Chadwick Colonel M.D.   On: 07/12/2023 19:25    Microbiology: Results for orders placed or performed during the hospital encounter of 07/12/23  Urine Culture (for pregnant, neutropenic or urologic patients or patients with an indwelling urinary catheter)     Status: Abnormal (Preliminary result)   Collection Time: 07/12/23  5:42 PM   Specimen: Urine, Random  Result Value Ref Range Status   Specimen Description   Final    URINE, RANDOM Performed at [facility], [address] [place], [place] [address]    Special Requests   Final    NONE Performed at [facility], [address], [place], [place] [address]    Culture (A)  Final    >=100,000 COLONIES/mL ESCHERICHIA COLI SUSCEPTIBILITIES TO FOLLOW Performed at [facility], [address], [place], [place] [address]    Report Status PENDING  Incomplete    Labs: CBC: Recent Labs  Lab 07/12/23 1738 07/13/23 0516  WBC 8.4 7.0  NEUTROABS  5.6  --   HGB 15.0 12.3  HCT 42.6 34.2*  MCV 103.4* 102.7*  PLT 367 279   Basic Metabolic Panel: Recent Labs  Lab 07/12/23 1738 07/13/23 0516 07/14/23 0516 07/14/23 1432  NA 141 141 140  --   K 3.0* 2.8* 2.9* 4.7  CL 107 111 112*  --   CO2 22 21* 23  --   GLUCOSE 114* 84 80  --   BUN 7* 6*  6*  --   CREATININE 0.76 0.63 0.55  --   CALCIUM 8.9 7.8* 8.1*  --   MG 1.8 1.6* 2.1  --   PHOS 3.0 3.0  --   --    Liver Function Tests: Recent Labs  Lab 07/12/23 1738  AST 31  ALT 31  ALKPHOS 133*  BILITOT 1.3*  PROT 6.5  ALBUMIN 3.2*   CBG: No results for input(s): "GLUCAP" in the last 168 hours.  Discharge time spent: greater than 30 minutes.  Signed: Donaciano Frizzle, MD [facility] 07/14/2023

## 2023-07-14 NOTE — TOC Initial Note (Signed)
 Transition of Care [facility]) - Initial/Assessment Note    Patient Details  Name: Claire Brown MRN: [id number] Date of Birth: [birth year]  Transition [facility] Care [facility]) CM/SW Contact:    Odilia Bennett, LCSW Phone Number: 07/14/2023, 2:07 PM  Clinical Narrative:  CSW met with patient. No family at bedside. CSW introduced role and inquired about interest in SA resources. Patient declined. She declined and usually only has a few drinks on the weekends. She does not view this as an issue she cannot control. Patient confirmed she does not have a PCP. She will call her Medicaid worker to get assigned to a provider. No further concerns. CSW will continue to follow patient for support and facilitate return home when stable. Her sister or neighbor will transport her home at discharge.           Expected Discharge Plan: Home/Self Care Barriers to Discharge: Continued Medical Work up   Patient Goals and CMS Choice            Expected Discharge Plan and Services     Post Acute Care Choice: NA Living arrangements for the past 2 months: Single Family Home                                      Prior Living Arrangements/Services Living arrangements for the past 2 months: Single Family Home   Patient language and need for interpreter reviewed:: Yes Do you feel safe going back to the place where you live?: Yes      Need for Family Participation in Patient Care: Yes (Comment) Care giver support system in place?: Yes (comment)   Criminal Activity/Legal Involvement Pertinent to Current Situation/Hospitalization: No - Comment as needed  Activities of Daily Living   ADL Screening (condition at time of admission) Independently performs ADLs?: Yes (appropriate for developmental age) Is the patient deaf or have difficulty hearing?: No Does the patient have difficulty seeing, even when wearing glasses/contacts?: No Does the patient have difficulty concentrating, remembering, or making  decisions?: No  Permission Sought/Granted                  Emotional Assessment Appearance:: Appears stated age Attitude/Demeanor/Rapport: Engaged, Gracious Affect (typically observed): Accepting, Appropriate, Calm, Pleasant Orientation: : Oriented to Self, Oriented to Place, Oriented to  Time, Oriented to Situation Alcohol / Substance Use: Alcohol Use Psych Involvement: No (comment)  Admission diagnosis:  Colitis [K52.9] UTI (urinary tract infection) [N39.0] Urinary tract infection without hematuria, site unspecified [N39.0] Nausea and vomiting, unspecified vomiting type [R11.2] Patient Active Problem List   Diagnosis Date Noted   Tobacco abuse 07/13/2023   Hypomagnesemia 07/13/2023   Nausea vomiting and diarrhea 07/12/2023   Hypokalemia 07/12/2023   Nausea & vomiting 07/09/2019   UTI (urinary tract infection) 07/09/2019   Intractable headache 07/09/2019   Alcohol use disorder, mild, abuse 07/08/2019   Hepatic steatosis 07/08/2019   Alcoholic ketoacidosis 07/08/2019   Metabolic acidosis 07/08/2019   Shock (HCC) 03/29/2016   S/P laparoscopic hysterectomy 10/14/2015   Ovarian mass, right 09/24/2015   PCP:  Pcp, No Pharmacy:   [id number] [place], [place] - [address] AT [facility] OF [address] [place] [address] Phone: [phone] Fax: [phone]  [id number] - [address] [place] - [address] [place] [address] Phone: [phone] Fax: [phone]  Social Drivers of Health (SDOH) Social History: SDOH Screenings   Food Insecurity: No Food Insecurity (07/13/2023)  Housing: Low Risk  (07/13/2023)  Transportation Needs: No Transportation Needs (07/13/2023)  Utilities: Not At Risk (07/13/2023)  Social Connections: Socially Isolated (07/13/2023)  Tobacco Use: High Risk (09/24/2022)   SDOH Interventions:     Readmission Risk Interventions     No data to display

## 2023-07-14 NOTE — TOC Transition Note (Signed)
 Transition of Care [facility]) - Discharge Note   Patient Details  Name: Claire Brown MRN: [id number] Date of Birth: 08-27-1960  [facility] CM/SW Contact:  Odilia Bennett, LCSW Phone Number: 07/14/2023, 4:25 PM   Clinical Narrative: Patient has orders to discharge home today. No further concerns. CSW signing off.    Final next level of care: Home/Self Care Barriers to Discharge: Barriers Resolved   Patient Goals and CMS Choice            Discharge Placement                Patient to be transferred to facility by: Sister or neighbor   Patient and family notified of of transfer: 07/14/23  Discharge Plan and Services Additional resources added to the After Visit Summary for       Post Acute Care Choice: NA                               Social Drivers of Health (SDOH) Interventions SDOH Screenings   Food Insecurity: No Food Insecurity (07/13/2023)  Housing: Low Risk  (07/13/2023)  Transportation Needs: No Transportation Needs (07/13/2023)  Utilities: Not At Risk (07/13/2023)  Social Connections: Socially Isolated (07/13/2023)  Tobacco Use: High Risk (09/24/2022)     Readmission Risk Interventions     No data to display

## 2023-07-14 NOTE — Plan of Care (Signed)

## 2023-07-14 NOTE — Discharge Instructions (Signed)

## 2023-07-16 LAB — URINE CULTURE: Culture: >=100000 — AB

## 2024-01-22 ENCOUNTER — Emergency Department

## 2024-01-22 ENCOUNTER — Inpatient Hospital Stay
Admission: EM | Admit: 2024-01-22 | Discharge: 2024-02-16 | DRG: 432 | Disposition: E | Attending: Internal Medicine | Admitting: Internal Medicine

## 2024-01-22 ENCOUNTER — Other Ambulatory Visit: Payer: Self-pay

## 2024-01-22 DIAGNOSIS — D649 Anemia, unspecified: Secondary | ICD-10-CM | POA: Diagnosis present

## 2024-01-22 DIAGNOSIS — J690 Pneumonitis due to inhalation of food and vomit: Secondary | ICD-10-CM | POA: Diagnosis not present

## 2024-01-22 DIAGNOSIS — E559 Vitamin D deficiency, unspecified: Secondary | ICD-10-CM | POA: Diagnosis present

## 2024-01-22 DIAGNOSIS — B179 Acute viral hepatitis, unspecified: Secondary | ICD-10-CM | POA: Insufficient documentation

## 2024-01-22 DIAGNOSIS — F17210 Nicotine dependence, cigarettes, uncomplicated: Secondary | ICD-10-CM | POA: Diagnosis present

## 2024-01-22 DIAGNOSIS — R748 Abnormal levels of other serum enzymes: Secondary | ICD-10-CM | POA: Diagnosis not present

## 2024-01-22 DIAGNOSIS — Z1152 Encounter for screening for COVID-19: Secondary | ICD-10-CM | POA: Diagnosis not present

## 2024-01-22 DIAGNOSIS — E162 Hypoglycemia, unspecified: Secondary | ICD-10-CM | POA: Diagnosis present

## 2024-01-22 DIAGNOSIS — R9431 Abnormal electrocardiogram [ECG] [EKG]: Secondary | ICD-10-CM | POA: Diagnosis present

## 2024-01-22 DIAGNOSIS — K7581 Nonalcoholic steatohepatitis (NASH): Secondary | ICD-10-CM | POA: Diagnosis not present

## 2024-01-22 DIAGNOSIS — E861 Hypovolemia: Secondary | ICD-10-CM | POA: Diagnosis present

## 2024-01-22 DIAGNOSIS — J9602 Acute respiratory failure with hypercapnia: Secondary | ICD-10-CM | POA: Diagnosis not present

## 2024-01-22 DIAGNOSIS — J9601 Acute respiratory failure with hypoxia: Secondary | ICD-10-CM | POA: Diagnosis not present

## 2024-01-22 DIAGNOSIS — I959 Hypotension, unspecified: Secondary | ICD-10-CM | POA: Diagnosis not present

## 2024-01-22 DIAGNOSIS — J189 Pneumonia, unspecified organism: Secondary | ICD-10-CM | POA: Diagnosis not present

## 2024-01-22 DIAGNOSIS — R569 Unspecified convulsions: Secondary | ICD-10-CM | POA: Diagnosis not present

## 2024-01-22 DIAGNOSIS — R6521 Severe sepsis with septic shock: Secondary | ICD-10-CM | POA: Diagnosis not present

## 2024-01-22 DIAGNOSIS — F319 Bipolar disorder, unspecified: Secondary | ICD-10-CM | POA: Diagnosis present

## 2024-01-22 DIAGNOSIS — E8809 Other disorders of plasma-protein metabolism, not elsewhere classified: Secondary | ICD-10-CM | POA: Diagnosis present

## 2024-01-22 DIAGNOSIS — Z66 Do not resuscitate: Secondary | ICD-10-CM | POA: Diagnosis present

## 2024-01-22 DIAGNOSIS — K7210 Chronic hepatic failure without coma: Secondary | ICD-10-CM | POA: Diagnosis present

## 2024-01-22 DIAGNOSIS — D696 Thrombocytopenia, unspecified: Secondary | ICD-10-CM | POA: Diagnosis not present

## 2024-01-22 DIAGNOSIS — E876 Hypokalemia: Secondary | ICD-10-CM | POA: Diagnosis present

## 2024-01-22 DIAGNOSIS — K7031 Alcoholic cirrhosis of liver with ascites: Secondary | ICD-10-CM | POA: Diagnosis present

## 2024-01-22 DIAGNOSIS — E872 Acidosis, unspecified: Secondary | ICD-10-CM | POA: Diagnosis present

## 2024-01-22 DIAGNOSIS — K7030 Alcoholic cirrhosis of liver without ascites: Secondary | ICD-10-CM | POA: Diagnosis not present

## 2024-01-22 DIAGNOSIS — F1010 Alcohol abuse, uncomplicated: Secondary | ICD-10-CM | POA: Diagnosis present

## 2024-01-22 DIAGNOSIS — R739 Hyperglycemia, unspecified: Secondary | ICD-10-CM | POA: Diagnosis not present

## 2024-01-22 DIAGNOSIS — A419 Sepsis, unspecified organism: Secondary | ICD-10-CM | POA: Diagnosis not present

## 2024-01-22 DIAGNOSIS — D6959 Other secondary thrombocytopenia: Secondary | ICD-10-CM | POA: Diagnosis present

## 2024-01-22 DIAGNOSIS — R579 Shock, unspecified: Secondary | ICD-10-CM | POA: Diagnosis not present

## 2024-01-22 DIAGNOSIS — K529 Noninfective gastroenteritis and colitis, unspecified: Secondary | ICD-10-CM | POA: Diagnosis not present

## 2024-01-22 DIAGNOSIS — Z90710 Acquired absence of both cervix and uterus: Secondary | ICD-10-CM

## 2024-01-22 DIAGNOSIS — Z711 Person with feared health complaint in whom no diagnosis is made: Secondary | ICD-10-CM | POA: Diagnosis not present

## 2024-01-22 DIAGNOSIS — Z515 Encounter for palliative care: Secondary | ICD-10-CM | POA: Diagnosis not present

## 2024-01-22 DIAGNOSIS — K7011 Alcoholic hepatitis with ascites: Principal | ICD-10-CM | POA: Diagnosis present

## 2024-01-22 DIAGNOSIS — F1090 Alcohol use, unspecified, uncomplicated: Secondary | ICD-10-CM | POA: Diagnosis not present

## 2024-01-22 DIAGNOSIS — G9389 Other specified disorders of brain: Secondary | ICD-10-CM | POA: Diagnosis not present

## 2024-01-22 DIAGNOSIS — E440 Moderate protein-calorie malnutrition: Secondary | ICD-10-CM | POA: Diagnosis present

## 2024-01-22 DIAGNOSIS — Z6822 Body mass index (BMI) 22.0-22.9, adult: Secondary | ICD-10-CM

## 2024-01-22 DIAGNOSIS — F419 Anxiety disorder, unspecified: Secondary | ICD-10-CM | POA: Diagnosis present

## 2024-01-22 DIAGNOSIS — G9341 Metabolic encephalopathy: Secondary | ICD-10-CM | POA: Diagnosis not present

## 2024-01-22 DIAGNOSIS — J9600 Acute respiratory failure, unspecified whether with hypoxia or hypercapnia: Secondary | ICD-10-CM | POA: Diagnosis not present

## 2024-01-22 DIAGNOSIS — K7682 Hepatic encephalopathy: Secondary | ICD-10-CM | POA: Diagnosis not present

## 2024-01-22 DIAGNOSIS — G9340 Encephalopathy, unspecified: Secondary | ICD-10-CM | POA: Diagnosis not present

## 2024-01-22 DIAGNOSIS — Z880 Allergy status to penicillin: Secondary | ICD-10-CM

## 2024-01-22 DIAGNOSIS — Z888 Allergy status to other drugs, medicaments and biological substances status: Secondary | ICD-10-CM

## 2024-01-22 DIAGNOSIS — N179 Acute kidney failure, unspecified: Secondary | ICD-10-CM | POA: Diagnosis present

## 2024-01-22 DIAGNOSIS — R4182 Altered mental status, unspecified: Secondary | ICD-10-CM | POA: Diagnosis not present

## 2024-01-22 DIAGNOSIS — Z638 Other specified problems related to primary support group: Secondary | ICD-10-CM

## 2024-01-22 DIAGNOSIS — Z79899 Other long term (current) drug therapy: Secondary | ICD-10-CM

## 2024-01-22 DIAGNOSIS — R627 Adult failure to thrive: Secondary | ICD-10-CM | POA: Diagnosis present

## 2024-01-22 DIAGNOSIS — R112 Nausea with vomiting, unspecified: Secondary | ICD-10-CM | POA: Diagnosis present

## 2024-01-22 DIAGNOSIS — J9690 Respiratory failure, unspecified, unspecified whether with hypoxia or hypercapnia: Secondary | ICD-10-CM | POA: Diagnosis not present

## 2024-01-22 DIAGNOSIS — K7010 Alcoholic hepatitis without ascites: Secondary | ICD-10-CM | POA: Diagnosis not present

## 2024-01-22 DIAGNOSIS — R1310 Dysphagia, unspecified: Secondary | ICD-10-CM | POA: Diagnosis present

## 2024-01-22 DIAGNOSIS — Z7189 Other specified counseling: Secondary | ICD-10-CM | POA: Diagnosis not present

## 2024-01-22 DIAGNOSIS — E538 Deficiency of other specified B group vitamins: Secondary | ICD-10-CM | POA: Diagnosis present

## 2024-01-22 LAB — COMPREHENSIVE METABOLIC PANEL WITH GFR
ALT: 256 U/L — ABNORMAL HIGH (ref 0–44)
AST: 1290 U/L — ABNORMAL HIGH (ref 15–41)
Albumin: 1.9 g/dL — ABNORMAL LOW (ref 3.5–5.0)
Alkaline Phosphatase: 712 U/L — ABNORMAL HIGH (ref 38–126)
Anion gap: 15 (ref 5–15)
BUN: 9 mg/dL (ref 8–23)
CO2: 30 mmol/L (ref 22–32)
Calcium: 7.9 mg/dL — ABNORMAL LOW (ref 8.9–10.3)
Chloride: 94 mmol/L — ABNORMAL LOW (ref 98–111)
Creatinine, Ser: 0.93 mg/dL (ref 0.44–1.00)
GFR, Estimated: >60 mL/min (ref >60)
Glucose, Bld: 110 mg/dL — ABNORMAL HIGH (ref 70–99)
Potassium: 2.5 mmol/L — CL (ref 3.5–5.1)
Sodium: 139 mmol/L (ref 135–145)
Total Bilirubin: 2.7 mg/dL — ABNORMAL HIGH (ref 0.0–1.2)
Total Protein: 4.3 g/dL — ABNORMAL LOW (ref 6.5–8.1)

## 2024-01-22 LAB — CBC WITH DIFFERENTIAL/PLATELET
Abs Immature Granulocytes: 0.05 K/uL (ref 0.00–0.07)
Basophils Absolute: 0 K/uL (ref 0.0–0.1)
Basophils Relative: 0 %
Eosinophils Absolute: 0.2 K/uL (ref 0.0–0.5)
Eosinophils Relative: 2 %
HCT: 29.4 % — ABNORMAL LOW (ref 36.0–46.0)
Hemoglobin: 10.3 g/dL — ABNORMAL LOW (ref 12.0–15.0)
Immature Granulocytes: 1 %
Lymphocytes Relative: 11 %
Lymphs Abs: 0.9 K/uL (ref 0.7–4.0)
MCH: 39.6 pg — ABNORMAL HIGH (ref 26.0–34.0)
MCHC: 35 g/dL (ref 30.0–36.0)
MCV: 113.1 fL — ABNORMAL HIGH (ref 80.0–100.0)
Monocytes Absolute: 0.4 K/uL (ref 0.1–1.0)
Monocytes Relative: 5 %
Neutro Abs: 6.4 K/uL (ref 1.7–7.7)
Neutrophils Relative %: 81 %
Platelets: 143 K/uL — ABNORMAL LOW (ref 150–400)
RBC: 2.6 MIL/uL — ABNORMAL LOW (ref 3.87–5.11)
RDW: 18.3 % — ABNORMAL HIGH (ref 11.5–15.5)
WBC: 8 K/uL (ref 4.0–10.5)
nRBC: 0.2 % (ref 0.0–0.2)

## 2024-01-22 LAB — CBG MONITORING, ED
Glucose-Capillary: 571 mg/dL (ref 70–99)
Glucose-Capillary: 44 mg/dL — CL (ref 70–99)
Glucose-Capillary: 51 mg/dL — ABNORMAL LOW (ref 70–99)
Glucose-Capillary: 134 mg/dL — ABNORMAL HIGH (ref 70–99)
Glucose-Capillary: 35 mg/dL — CL (ref 70–99)
Glucose-Capillary: 155 mg/dL — ABNORMAL HIGH (ref 70–99)

## 2024-01-22 LAB — TROPONIN T, HIGH SENSITIVITY
Troponin T High Sensitivity: 15 ng/L (ref 0–19)
Troponin T High Sensitivity: <15 ng/L (ref 0–19)

## 2024-01-22 LAB — ETHANOL: Alcohol, Ethyl (B): <15 mg/dL (ref <15)

## 2024-01-22 LAB — TSH: TSH: 1.17 u[IU]/mL (ref 0.350–4.500)

## 2024-01-22 LAB — RESP PANEL BY RT-PCR (RSV, FLU A&B, COVID)  RVPGX2
Influenza A by PCR: NEGATIVE
Influenza B by PCR: NEGATIVE
Resp Syncytial Virus by PCR: NEGATIVE
SARS Coronavirus 2 by RT PCR: NEGATIVE

## 2024-01-22 LAB — MAGNESIUM: Magnesium: 2.2 mg/dL (ref 1.7–2.4)

## 2024-01-22 LAB — CK: Total CK: 122 U/L (ref 38–234)

## 2024-01-22 LAB — BETA-HYDROXYBUTYRIC ACID: Beta-Hydroxybutyric Acid: 0.07 mmol/L (ref 0.05–0.27)

## 2024-01-22 LAB — T4, FREE: Free T4: 0.7 ng/dL (ref 0.61–1.12)

## 2024-01-22 LAB — ACETAMINOPHEN LEVEL: Acetaminophen (Tylenol), Serum: 15 ug/mL (ref 10–30)

## 2024-01-22 LAB — SALICYLATE LEVEL: Salicylate Lvl: <7 mg/dL — ABNORMAL LOW (ref 7.0–30.0)

## 2024-01-22 MED ORDER — POTASSIUM CHLORIDE 20 MEQ PO PACK
40.0000 meq | PACK | Freq: Every day | ORAL | Status: DC
  Administered 2024-01-22 – 2024-01-23 (×2): 40 meq via ORAL
  Filled 2024-01-22 (×2): qty 2

## 2024-01-22 MED ORDER — POTASSIUM CHLORIDE 10 MEQ/100ML IV SOLN
10.0000 meq | INTRAVENOUS | Status: AC
  Administered 2024-01-22: 10 meq via INTRAVENOUS
  Filled 2024-01-22 (×3): qty 100

## 2024-01-22 MED ORDER — DEXTROSE 10 % IV SOLN: INTRAVENOUS | Status: AC

## 2024-01-22 MED ORDER — SODIUM CHLORIDE 0.9 % IV BOLUS
1000.0000 mL | Freq: Once | INTRAVENOUS | Status: AC
  Administered 2024-01-22: 1000 mL via INTRAVENOUS

## 2024-01-22 MED ORDER — ONDANSETRON HCL 4 MG/2ML IJ SOLN
4.0000 mg | Freq: Once | INTRAMUSCULAR | Status: AC
  Administered 2024-01-22: 4 mg via INTRAVENOUS
  Filled 2024-01-22: qty 2

## 2024-01-22 MED ORDER — POTASSIUM CHLORIDE 10 MEQ/100ML IV SOLN
10.0000 meq | INTRAVENOUS | Status: AC
  Administered 2024-01-23 (×3): 10 meq via INTRAVENOUS
  Filled 2024-01-22 (×3): qty 100

## 2024-01-22 MED ORDER — DEXTROSE 50 % IV SOLN
1.0000 | Freq: Once | INTRAVENOUS | Status: AC
  Administered 2024-01-22: 50 mL via INTRAVENOUS
  Filled 2024-01-22: qty 50

## 2024-01-22 MED ORDER — POTASSIUM CHLORIDE 10 MEQ/100ML IV SOLN
10.0000 meq | INTRAVENOUS | Status: AC
  Administered 2024-01-22: 10 meq via INTRAVENOUS

## 2024-01-22 MED ORDER — IOHEXOL 300 MG/ML  SOLN
100.0000 mL | Freq: Once | INTRAMUSCULAR | Status: AC | PRN
  Administered 2024-01-22: 100 mL via INTRAVENOUS

## 2024-01-22 MED ORDER — PANTOPRAZOLE SODIUM 40 MG IV SOLR
40.0000 mg | INTRAVENOUS | Status: DC
  Administered 2024-01-22 – 2024-01-24 (×3): 40 mg via INTRAVENOUS
  Filled 2024-01-22 (×3): qty 10

## 2024-01-22 MED ORDER — PROCHLORPERAZINE EDISYLATE 10 MG/2ML IJ SOLN
10.0000 mg | INTRAMUSCULAR | Status: DC | PRN
  Administered 2024-01-23: 10 mg via INTRAVENOUS
  Filled 2024-01-22 (×2): qty 2

## 2024-01-22 NOTE — H&P (Signed)
 History and Physical    Patient: Claire Brown DOB: 1960/04/30 DOA: 01/22/2024 DOS: the patient was seen and examined on 01/22/2024 PCP: Pcp, No  Patient coming from: Home  Chief Complaint:  Chief Complaint  Patient presents with   Failure To Thrive    HPI: Claire Brown is a 63 y.o. female with medical history significant for Bipolar mood disorder, alcohol and tobacco use disorder, history of admission in 2021 for alcoholic ketoacidosis, being admitted with severe steatohepatitis, AKI, hypoglycemia and electrolyte derangement.  She presented with a 1 week history of vomiting and inability to hold anything down, and difficulty swallowing.  She had 2 soft stools with the last one being about 5 days ago.  States started feeling weak about 2 weeks ago and has occasional right upper quadrant pain.  States that she no longer drinks heavily and drinks a glass of wine once a week, no beer and no liquor  With EMS, she was hypotensive and hypoglycemic and given an amp of D50 and started on D10.  No history of diabetes.  Of note, several years prior in 2017, patient had a somewhat similar presentation with hypotension/hypoglycemia (blood glucose as low as 20),hypothermia, hypokalemia/hypomagnesemia.  She had a negative insulinoma workup(c-peptide and pro-insulin levels elevated) and negative sulfonylurea quantitative screen.  Ultimately the etiology of her hypoglycemia was unknown.    In the ED she was persistently hypotensive with SBP in the mid 80s, pulse in the 80s vitals otherwise normal at Labs notable for normal WBC, mild anemia of 10.3 mild thrombocytopenia of 143.  Baseline hemoglobin is 12.3 and baseline platelets 279. Markedly abnormal LFTs with AST/ALT 1290/256, alk phos 712 and total bilirubin 2.7 Creatinine 0.93 up from baseline of 0.55 Potassium 2.5, magnesium  2.2 Alcohol, acetaminophen  and salicylate levels undetectable CK, TSH and troponin normal Respiratory viral  panel negative UA and UDS pending. EKG showed sinus at 82 with low voltage CT soft tissue neck reflecting possible pharyngitis CT abdomen and pelvis: Possible inflammatory or infectious colitis and severe hepatic steatosis, possible gastritis RUQ ultrasound nonspecific gallbladder wall thickening  Patient started on oral and IV potassium repletion given an amp of D50 and continued on D10 given NS boluses  Admission requested      Past Medical History:  Diagnosis Date   Anemia    Anxiety    Arthritis    Bipolar disorder (HCC)    Headache    Past Surgical History:  Procedure Laterality Date   CESAREAN SECTION     CYSTOSCOPY N/A 10/14/2015   Procedure: CYSTOSCOPY;  Surgeon: Glory High, MD;  Location: [facility];  Service: Gynecology;  Laterality: N/A;   FRACTURE SURGERY Left 12/2015   Femoral Neck Pin Placement - Dr. Madelynn ([facility])   KNEE SURGERY Bilateral    LAPAROSCOPIC BILATERAL SALPINGO OOPHERECTOMY  10/14/2015   Procedure: LAPAROSCOPIC BILATERAL SALPINGO OOPHORECTOMY;  Surgeon: Glory High, MD;  Location: [facility];  Service: Gynecology;;   LAPAROSCOPIC HYSTERECTOMY N/A 10/14/2015   Procedure: HYSTERECTOMY TOTAL LAPAROSCOPIC;  Surgeon: Glory High, MD;  Location: [facility];  Service: Gynecology;  Laterality: N/A;   Social History:  reports that she has been smoking cigarettes. She has never used smokeless tobacco. She reports current alcohol use. She reports that she does not use drugs.  Allergies  Allergen Reactions   Amoxicillin-Pot Clavulanate Nausea Only and Nausea And Vomiting   Oxycodone  Anxiety, Other (See Comments) and Dermatitis    Reaction: jittery/ unable to sleep   Penicillins Itching, Other (See  Comments) and Hives    Patient cannot remember the specifics of the reaction. Has taken Amoxicillin since with no problem.   Diazepam Anxiety, Other (See Comments) and Nausea Only    Reaction: unable to sleep    Family History  Problem Relation Age  of Onset   Cancer Brother        loyphoma    Prior to Admission medications   Medication Sig Start Date End Date Taking? Authorizing Provider  acetaminophen  (TYLENOL ) 325 MG tablet Take 650 mg by mouth every 12 (twelve) hours as needed for mild pain (pain score 1-3) or moderate pain (pain score 4-6).   Yes [provider]  dimenhyDRINATE (DRAMAMINE) 50 MG tablet Take 50 mg by mouth every 8 (eight) hours as needed.   Yes [provider]  diphenhydrAMINE  (BENADRYL ) 25 MG tablet Take 25 mg by mouth every 6 (six) hours as needed for allergies.   Yes [provider]  Multiple Vitamin (MULTIVITAMIN WITH MINERALS) TABS tablet Take 1 tablet by mouth daily.   Yes [provider]  Potassium Gluconate 550 MG TABS Take 550 mg by mouth every morning.   Yes [provider]    Physical Exam: Vitals:   01/22/24 2100 01/22/24 2130 01/22/24 2200 01/22/24 2230  BP: (!) 84/71 (!) 80/62 (!) 83/72 (!) 84/63  Pulse: 88 85 90 94  Resp: (!) 21 (!) 26 (!) 21 (!) 25  Temp:      TempSrc:      SpO2: 100% (!) 74% 100% 97%  Weight:      Height:       Physical Exam Vitals and nursing note reviewed.  Constitutional:      General: She is not in acute distress. HENT:     Head: Normocephalic and atraumatic.  Cardiovascular:     Rate and Rhythm: Normal rate and regular rhythm.     Heart sounds: Normal heart sounds.  Pulmonary:     Effort: Pulmonary effort is normal.     Breath sounds: Normal breath sounds.  Abdominal:     Palpations: Abdomen is soft.     Tenderness: There is no abdominal tenderness.  Neurological:     Mental Status: Mental status is at baseline.     Labs on Admission: I have personally reviewed following labs and imaging studies  CBC: Recent Labs  Lab 01/22/24 1428  WBC 8.0  NEUTROABS 6.4  HGB 10.3*  HCT 29.4*  MCV 113.1*  PLT 143*   Basic Metabolic Panel: Recent Labs  Lab 01/22/24 1428  NA 139  K 2.5*  CL 94*  CO2 30   GLUCOSE 110*  BUN 9  CREATININE 0.93  CALCIUM 7.9*  MG 2.2   GFR: Estimated Creatinine Clearance: 45.1 mL/min (by C-G formula based on SCr of 0.93 mg/dL). Liver Function Tests: Recent Labs  Lab 01/22/24 1428  AST 1,290*  ALT 256*  ALKPHOS 712*  BILITOT 2.7*  PROT 4.3*  ALBUMIN  1.9*   No results for input(s): LIPASE, AMYLASE in the last 168 hours. No results for input(s): AMMONIA in the last 168 hours. Coagulation Profile: No results for input(s): INR, PROTIME in the last 168 hours. Cardiac Enzymes: Recent Labs  Lab 01/22/24 1917  CKTOTAL 122   BNP (last 3 results) No results for input(s): PROBNP in the last 8760 hours. HbA1C: No results for input(s): HGBA1C in the last 72 hours. CBG: Recent Labs  Lab 01/22/24 1241 01/22/24 1245 01/22/24 1425 01/22/24 2147 01/22/24 2311  GLUCAP 44*  51* 134* 35* 155*   Lipid Profile: No results for input(s): CHOL, HDL, LDLCALC, TRIG, CHOLHDL, LDLDIRECT in the last 72 hours. Thyroid  Function Tests: Recent Labs    01/22/24 1428  TSH 1.170  FREET4 0.70   Anemia Panel: No results for input(s): VITAMINB12, FOLATE, FERRITIN, TIBC, IRON, RETICCTPCT in the last 72 hours. Urine analysis:    Component Value Date/Time   COLORURINE YELLOW (A) 07/12/2023 1742   APPEARANCEUR CLOUDY (A) 07/12/2023 1742   LABSPEC 1.018 07/12/2023 1742   PHURINE 7.0 07/12/2023 1742   GLUCOSEU NEGATIVE 07/12/2023 1742   HGBUR NEGATIVE 07/12/2023 1742   BILIRUBINUR NEGATIVE 07/12/2023 1742   KETONESUR NEGATIVE 07/12/2023 1742   PROTEINUR NEGATIVE 07/12/2023 1742   NITRITE NEGATIVE 07/12/2023 1742   LEUKOCYTESUR SMALL (A) 07/12/2023 1742    Radiological Exams on Admission: US  ABDOMEN LIMITED RUQ (LIVER/GB) Result Date: 01/22/2024 EXAM: Right Upper Quadrant Abdominal Ultrasound TECHNIQUE: Real-time ultrasonography of the right upper quadrant of the abdomen was performed. COMPARISON: US  Abdomen Limited  07/09/2019. CLINICAL HISTORY: Abdomen pain, elevated liver function tests. FINDINGS: LIVER: Nodular hepatic contour. Increased hepatic echogenicity. No intrahepatic biliary ductal dilatation. No mass. BILIARY SYSTEM: Gallbladder wall thickening measures 4.2 mm. No evidence of pericholecystic fluid. No cholelithiasis. The common bile duct measures 2.3 mm. RIGHT KIDNEY: The right kidney is grossly unremarkable in appearances without evidence of hydronephrosis, echogenic calculi or worrisome mass lesions. PANCREAS: Visualized portions of the pancreas are unremarkable. OTHER: Small volume simple free fluid ascites. IMPRESSION: 1. Cirrhosis and hepatic steatosis. No focal liver lesions identified. Please note that liver protocol enhanced MR and CT are the most sensitive tests for the screening detection of hepatocellular carcinoma in the high risk setting of cirrhosis. 2. Nonspecific gallbladder wall thickening. This can be seen in the setting of chronic liver disease. 3. Small-volume simple ascites. Electronically signed by: Kate Plummer MD 01/22/2024 07:21 PM EST RP Workstation: [id number]   CT ABDOMEN PELVIS W CONTRAST Result Date: 01/22/2024 CLINICAL DATA:  Abdominal pain, diarrhea EXAM: CT ABDOMEN AND PELVIS WITH CONTRAST TECHNIQUE: Multidetector CT imaging of the abdomen and pelvis was performed using the standard protocol following bolus administration of intravenous contrast. RADIATION DOSE REDUCTION: This exam was performed according to the departmental dose-optimization program which includes automated exposure control, adjustment of the mA and/or kV according to patient size and/or use of iterative reconstruction technique. CONTRAST:  OMNIPAQUE  IOHEXOL  300 MG/ML  SOLN COMPARISON:  07/12/2023 FINDINGS: Lower chest: Trace bilateral pleural effusions and minimal dependent lower lobe atelectasis. Small hiatal hernia. Hepatobiliary: Marked hepatic steatosis. No focal liver abnormality. Gallbladder is  moderately distended, with no evidence of cholelithiasis or cholecystitis. Pancreas: Unremarkable. No pancreatic ductal dilatation or surrounding inflammatory changes. Spleen: Normal in size without focal abnormality. Adrenals/Urinary Tract: Kidneys enhance normally and symmetrically. No urinary tract calculi or obstruction. The adrenals and bladder are unremarkable. Stomach/Bowel: No bowel obstruction or ileus. There is diffuse colonic wall thickening compatible with inflammatory or infectious colitis. Additionally, there is thickening of the gastric rugal folds which could reflect gastritis. Vascular/Lymphatic: Aortic atherosclerosis. No enlarged abdominal or pelvic lymph nodes. Reproductive: Status post hysterectomy. No adnexal masses. Other: Small volume ascites, most pronounced in the lower pelvis. No free intraperitoneal gas. No abdominal wall hernia. Musculoskeletal: Stable postsurgical changes of the left hip. No acute or destructive bony abnormalities. Reconstructed images demonstrate no additional findings. IMPRESSION: 1. Diffuse colonic wall thickening compatible with inflammatory or infectious colitis. 2. Thickening of the gastric rugal folds which may reflect gastritis. 3. Severe  hepatic steatosis. 4. Small volume ascites. 5. Trace bilateral pleural effusions. 6. Small hiatal hernia. 7.  Aortic Atherosclerosis (ICD10-I70.0). Electronically Signed   By: Ozell Daring M.D.   On: 01/22/2024 16:53   CT C-SPINE NO CHARGE Result Date: 01/22/2024 EXAM: CT CERVICAL SPINE WITHOUT CONTRAST 01/22/2024 04:35:27 PM TECHNIQUE: CT of the cervical spine was performed without the administration of intravenous contrast. Multiplanar reformatted images are provided for review. Automated exposure control, iterative reconstruction, and/or weight based adjustment of the mA/kV was utilized to reduce the radiation dose to as low as reasonably achievable. COMPARISON: None available. CLINICAL HISTORY: FINDINGS: CERVICAL  SPINE: BONES AND ALIGNMENT: Straightening of the normal cervical lordosis is present. Slight degenerative retrolisthesis is present at C5-C6. No acute fracture or traumatic malalignment. DEGENERATIVE CHANGES: Slight degenerative retrolisthesis is present at C5-C6. SOFT TISSUES: Atherosclerotic changes are present at the carotid bifurcations without significant stenosis. No prevertebral soft tissue swelling. LUNGS: Centrilobular emphysematous changes are noted. IMPRESSION: 1. No acute abnormality of the cervical spine. 2. Straightening of the normal cervical lordosis and slight degenerative retrolisthesis at C5-C6. 3. Centrilobular emphysematous changes are noted; consider evaluation for eligibility for low-dose CT lung cancer screening program given emphysema as an independent risk factor for lung cancer in appropriate age groups. Electronically signed by: Lonni Necessary MD 01/22/2024 04:51 PM EST RP Workstation: [id number]   CT Soft Tissue Neck W Contrast Result Date: 01/22/2024 EXAM: CT NECK WITH CONTRAST 01/22/2024 04:35:27 PM TECHNIQUE: CT of the neck was performed with the administration of 100 mL of iohexol  (OMNIPAQUE ) 300 MG/ML solution. Multiplanar reformatted images are provided for review. Automated exposure control, iterative reconstruction, and/or weight based adjustment of the mA/kV was utilized to reduce the radiation dose to as low as reasonably achievable. COMPARISON: None available. CLINICAL HISTORY: Recurrent postprandial emphysema. Right sided dysphagia. Question foreign body. FINDINGS: AERODIGESTIVE TRACT: Mucosal enhancement in the oropharynx may reflect pharyngitis. No discrete mass lesion or foreign body is present. No edema. SALIVARY GLANDS: The parotid and submandibular glands are unremarkable. THYROID : Unremarkable. LYMPH NODES: No suspicious cervical lymphadenopathy. SOFT TISSUES: No mass or fluid collection. BRAIN, ORBITS, SINUSES AND MASTOIDS: No acute abnormality. LUNGS AND  MEDIASTINUM: Centrilobular emphysematous changes are present in both lungs. Mild dependent atelectasis is present. Atherosclerotic calcifications are present at the aortic arch and great vessel origins. BONES: No focal bone abnormality. VASCULATURE: Atherosclerotic calcifications are present at the carotid bifurcations bilaterally. No focal stenosis is present. IMPRESSION: 1. Mucosal enhancement in the oropharynx, possibly reflecting pharyngitis. No discrete mass lesion or foreign body identified. 2. Centrilobular emphysema. Consider evaluation for a low-dose CT lung cancer screening program given emphysema as an independent risk factor. 3. Atherosclerotic changes as detailed above, without significant stenosis. Electronically signed by: Lonni Necessary MD 01/22/2024 04:47 PM EST RP Workstation: [id number]   CT HEAD WO CONTRAST ( ) Result Date: 01/22/2024 EXAM: CT HEAD WITHOUT CONTRAST 01/22/2024 04:35:27 PM TECHNIQUE: CT of the head was performed without the administration of intravenous contrast. Automated exposure control, iterative reconstruction, and/or weight based adjustment of the mA/kV was utilized to reduce the radiation dose to as low as reasonably achievable. COMPARISON: CT head without contrast 07/08/2019. CLINICAL HISTORY: Headache, new onset (Age >= 51y). Fall 1 week ago. FINDINGS: BRAIN AND VENTRICLES: No acute hemorrhage. No evidence of acute infarct. Periventricular white matter decreased attenuation consistent with small vessel ischemic changes. Prominent ventricles, sulci and cisterns consistent with age-related involutional changes. Atrophy and white matter changes are moderately advanced for age with some progression since the prior  exam. No hydrocephalus. No extra-axial collection. No mass effect or midline shift. ORBITS: No acute abnormality. SINUSES: No acute abnormality. SOFT TISSUES AND SKULL: No acute soft tissue abnormality. No skull fracture. IMPRESSION: 1. No acute  intracranial abnormality. 2. Moderately advanced age-related involutional changes and small vessel ischemic changes with some progression since the prior exam. Electronically signed by: Lonni Necessary MD 01/22/2024 04:40 PM EST RP Workstation: [id number]   DG Chest 2 View Result Date: 01/22/2024 CLINICAL DATA:  Poor oral intake. EXAM: DG CHEST 2V COMPARISON:  05/09/2022. FINDINGS: The heart size and mediastinal contours are within normal limits. No consolidation is seen. There are likely small bilateral pleural effusions with a associated atelectasis or infiltrate on the lateral view. Degenerative changes are present in the thoracic spine. Old rib fractures are present in the ribs on the right. No acute osseous abnormality is seen. IMPRESSION: Small bilateral pleural effusions with atelectasis or infiltrate. Electronically Signed   By: Leita Birmingham M.D.   On: 01/22/2024 16:01   Data Reviewed for HPI: Relevant notes from primary care and specialist visits, past discharge summaries as available in EHR, including Care Everywhere. Prior diagnostic testing as pertinent to current admission diagnoses Updated medications and problem lists for reconciliation ED course, including vitals, labs, imaging, treatment and response to treatment Triage notes, nursing and pharmacy notes and ED provider's notes Notable results as noted above in HPI      Assessment and Plan: * Steatohepatitis, severe Endorses only occasional alcohol use, previous heavy drinker, stopped in May 2025 Possibly triggered by acute gastritis/colitis Presents with vomiting, elevated AST/ALT, alk phos, bilirubin, INR, anemia thrombocytopenia CT abdomen and pelvis showing severe hepatic steatosis, possible colitis, possible gastritis IV hydration IV Protonix , IV antiemetics Dietary consult GI consult for additional recommendations Monitor LFTs  Hypokalemia due to excessive gastrointestinal loss of potassium Secondary to  intractable vomiting Received IV and oral repletion in the ED Pharmacy consulted for electrolyte management Magnesium  was normal Continue to monitor both and correct as needed  Hypotension Suspect ATN secondary to renal hypoperfusion from dehydration and hypotension Continue IV fluid resuscitation  Hypoglycemia Suspect nutritional Similar presentation back in 2017 had a negative insulinoma workup Continue hydration with D10 with frequent CBG checks and as needed D50  AKI (acute kidney injury) Creatinine 0.93 up from baseline of 0.55.  GFR still above 60 Possible hepatorenal syndrome IV hydration Monitor renal function  Hypoalbuminemia Suspect nutritional/related to liver disease Dietary consulted  Acute gastroenteritis Gastritis and colitis seen on CT Patient unable to hold anything down x 5 days, had 2 loose BMs since resolved IV Protonix  IV fluids, IV antiemetics and supportive care GI PCR panel if diarrhea returns  Alcohol use disorder History of alcohol use disorder Patient not quantifying use EtOH level undetectable CIWA withdrawal protocol  Anemia Thrombocytopenia, mild Hemoglobin 10.3 down from 12.3 and platelets 143 down from 279  Suspect related to alcohol use Continue to monitor    DVT prophylaxis: Lovenox   Consults: GI  Advance Care Planning:   Code Status: Prior   Family Communication: none  Disposition Plan: Back to previous home environment  Severity of Illness: The appropriate patient status for this patient is OBSERVATION. Observation status is judged to be reasonable and necessary in order to provide the required intensity of service to ensure the patient's safety. The patient's presenting symptoms, physical exam findings, and initial radiographic and laboratory data in the context of their medical condition is felt to place them at decreased risk for further clinical  deterioration. Furthermore, it is anticipated that the patient will be  medically stable for discharge from the hospital within 2 midnights of admission.   Author: Delayne LULLA Solian, MD 01/22/2024 11:46 PM  For on call review [email or web address].

## 2024-01-22 NOTE — H&P (Incomplete)
 History and Physical    Patient: [name]:[id number] DOB: 1960-06-10 DOA: 01/22/2024 DOS: the patient was seen and examined on 01/22/2024 PCP: Pcp, No  Patient coming from: Home  Chief Complaint:  Chief Complaint  Patient presents with  . Failure To Thrive    HPI: Claire Brown is a 63 y.o. female with medical history significant for Bipolar mood disorder, alcohol and tobacco use disorder, history of admission in 2021 for alcoholic ketoacidosis, being admitted with severe steatohepatitis suspect severe associated with AKI, hypoglycemia and electrolyte derangement.  She presented with a several week history of vomiting, inability to hold anything down, and difficulty swallowing.  . Of note, patient had a somewhat similar presentation at [facility] back in 2017 with hypotension/hypoglycemia (blood glucose as low as 20),hypothermia, hypokalemia/hypomagnesemia.  She had a negative insulinoma workup(c-peptide and pro-insulin levels elevated), and negative sulfonylurea quantitative screen.  Ultimately the etiology of her hypoglycemia was unknown.  She did not however present with vomiting at that time. In the ED she was persistently hypotensive with SBP in the mid 80s, pulse in the 80s vitals otherwise normal at Labs notable for normal WBC, mild anemia of 10.3 mild thrombocytopenia of 143.  Baseline hemoglobin is 12.3 and baseline platelets 279. Markedly abnormal LFTs with AST/ALT 1290/256, alk phos 712 and total bilirubin 2.7 Creatinine 0.93 up from baseline of 0.55 Potassium 2.5, magnesium  2.2 Alcohol, acetaminophen  and salicylate levels undetectable CK, TSH and troponin normal Respiratory viral panel negative UA and UDS pending. EKG showed sinus at 82 with low voltage CT soft tissue neck reflecting possible pharyngitis CT abdomen and pelvis: Possible inflammatory or infectious colitis and severe hepatic steatosis, possible gastritis RUQ ultrasound nonspecific gallbladder wall  thickening  Patient started on oral and IV potassium repletion given an amp of D50 and continued on D10 given NS boluses  Admission requested      Past Medical History:  Diagnosis Date  . Anemia   . Anxiety   . Arthritis   . Bipolar disorder (HCC)   . Headache    Past Surgical History:  Procedure Laterality Date  . CESAREAN SECTION    . CYSTOSCOPY N/A 10/14/2015   Procedure: CYSTOSCOPY;  Surgeon: Glory High, MD;  Location: [facility] ORS;  Service: Gynecology;  Laterality: N/A;  . FRACTURE SURGERY Left 12/2015   Femoral Neck Pin Placement - [facility])  . KNEE SURGERY Bilateral   . LAPAROSCOPIC BILATERAL SALPINGO OOPHERECTOMY  10/14/2015   Procedure: LAPAROSCOPIC BILATERAL SALPINGO OOPHORECTOMY;  Surgeon: Glory High, MD;  Location: [facility] ORS;  Service: Gynecology;;  . LAPAROSCOPIC HYSTERECTOMY N/A 10/14/2015   Procedure: HYSTERECTOMY TOTAL LAPAROSCOPIC;  Surgeon: Glory High, MD;  Location: [facility] ORS;  Service: Gynecology;  Laterality: N/A;   Social History:  reports that she has been smoking cigarettes. She has never used smokeless tobacco. She reports current alcohol use. She reports that she does not use drugs.  Allergies  Allergen Reactions  . Amoxicillin-Pot Clavulanate Nausea Only and Nausea And Vomiting  . Oxycodone  Anxiety, Other (See Comments) and Dermatitis    Reaction: jittery/ unable to sleep  . Penicillins Itching, Other (See Comments) and Hives    Patient cannot remember the specifics of the reaction. Has taken Amoxicillin since with no problem.  . Diazepam Anxiety, Other (See Comments) and Nausea Only    Reaction: unable to sleep    Family History  Problem Relation Age of Onset  . Cancer Brother        loyphoma  Prior to Admission medications   Medication Sig Start Date End Date Taking? Authorizing Provider  acetaminophen  (TYLENOL ) 325 MG tablet Take 650 mg by mouth every 12 (twelve) hours as needed for mild pain (pain score 1-3)  or moderate pain (pain score 4-6).   Yes [provider]  dimenhyDRINATE (DRAMAMINE) 50 MG tablet Take 50 mg by mouth every 8 (eight) hours as needed.   Yes [provider]  diphenhydrAMINE  (BENADRYL ) 25 MG tablet Take 25 mg by mouth every 6 (six) hours as needed for allergies.   Yes [provider]  Multiple Vitamin (MULTIVITAMIN WITH MINERALS) TABS tablet Take 1 tablet by mouth daily.   Yes [provider]  Potassium Gluconate 550 MG TABS Take 550 mg by mouth every morning.   Yes [provider]    Physical Exam: Vitals:   01/22/24 2100 01/22/24 2130 01/22/24 2200 01/22/24 2230  BP: (!) 84/71 (!) 80/62 (!) 83/72 (!) 84/63  Pulse: 88 85 90 94  Resp: (!) 21 (!) 26 (!) 21 (!) 25  Temp:      TempSrc:      SpO2: 100% (!) 74% 100% 97%  Weight:      Height:       Physical Exam  Labs on Admission: I have personally reviewed following labs and imaging studies  CBC: Recent Labs  Lab 01/22/24 1428  WBC 8.0  NEUTROABS 6.4  HGB 10.3*  HCT 29.4*  MCV 113.1*  PLT 143*   Basic Metabolic Panel: Recent Labs  Lab 01/22/24 1428  NA 139  K 2.5*  CL 94*  CO2 30  GLUCOSE 110*  BUN 9  CREATININE 0.93  CALCIUM 7.9*  MG 2.2   GFR: Estimated Creatinine Clearance: 45.1 mL/min (by C-G formula based on SCr of 0.93 mg/dL). Liver Function Tests: Recent Labs  Lab 01/22/24 1428  AST 1,290*  ALT 256*  ALKPHOS 712*  BILITOT 2.7*  PROT 4.3*  ALBUMIN  1.9*   No results for input(s): LIPASE, AMYLASE in the last 168 hours. No results for input(s): AMMONIA in the last 168 hours. Coagulation Profile: No results for input(s): INR, PROTIME in the last 168 hours. Cardiac Enzymes: Recent Labs  Lab 01/22/24 1917  CKTOTAL 122   BNP (last 3 results) No results for input(s): PROBNP in the last 8760 hours. HbA1C: No results for input(s): HGBA1C in the last 72 hours. CBG: Recent Labs  Lab 01/22/24 1241 01/22/24 1245 01/22/24 1425  01/22/24 2147 01/22/24 2311  GLUCAP 44* 51* 134* 35* 155*   Lipid Profile: No results for input(s): CHOL, HDL, LDLCALC, TRIG, CHOLHDL, LDLDIRECT in the last 72 hours. Thyroid  Function Tests: Recent Labs    01/22/24 1428  TSH 1.170  FREET4 0.70   Anemia Panel: No results for input(s): VITAMINB12, FOLATE, FERRITIN, TIBC, IRON, RETICCTPCT in the last 72 hours. Urine analysis:    Component Value Date/Time   COLORURINE YELLOW (A) 07/12/2023 1742   APPEARANCEUR CLOUDY (A) 07/12/2023 1742   LABSPEC 1.018 07/12/2023 1742   PHURINE 7.0 07/12/2023 1742   GLUCOSEU NEGATIVE 07/12/2023 1742   HGBUR NEGATIVE 07/12/2023 1742   BILIRUBINUR NEGATIVE 07/12/2023 1742   KETONESUR NEGATIVE 07/12/2023 1742   PROTEINUR NEGATIVE 07/12/2023 1742   NITRITE NEGATIVE 07/12/2023 1742   LEUKOCYTESUR SMALL (A) 07/12/2023 1742    Radiological Exams on Admission: US  ABDOMEN LIMITED RUQ (LIVER/GB) Result Date: 01/22/2024 EXAM: Right Upper Quadrant Abdominal Ultrasound TECHNIQUE: Real-time ultrasonography of the right upper quadrant of the abdomen was performed. COMPARISON: US   Abdomen Limited 07/09/2019. CLINICAL HISTORY: Abdomen pain, elevated liver function tests. FINDINGS: LIVER: Nodular hepatic contour. Increased hepatic echogenicity. No intrahepatic biliary ductal dilatation. No mass. BILIARY SYSTEM: Gallbladder wall thickening measures 4.2 mm. No evidence of pericholecystic fluid. No cholelithiasis. The common bile duct measures 2.3 mm. RIGHT KIDNEY: The right kidney is grossly unremarkable in appearances without evidence of hydronephrosis, echogenic calculi or worrisome mass lesions. PANCREAS: Visualized portions of the pancreas are unremarkable. OTHER: Small volume simple free fluid ascites. IMPRESSION: 1. Cirrhosis and hepatic steatosis. No focal liver lesions identified. Please note that liver protocol enhanced MR and CT are the most sensitive tests for the screening detection of  hepatocellular carcinoma in the high risk setting of cirrhosis. 2. Nonspecific gallbladder wall thickening. This can be seen in the setting of chronic liver disease. 3. Small-volume simple ascites. Electronically signed by: Kate Plummer MD 01/22/2024 07:21 PM EST RP Workstation: [id number]   CT ABDOMEN PELVIS W CONTRAST Result Date: 01/22/2024 CLINICAL DATA:  Abdominal pain, diarrhea EXAM: CT ABDOMEN AND PELVIS WITH CONTRAST TECHNIQUE: Multidetector CT imaging of the abdomen and pelvis was performed using the standard protocol following bolus administration of intravenous contrast. RADIATION DOSE REDUCTION: This exam was performed according to the departmental dose-optimization program which includes automated exposure control, adjustment of the mA and/or kV according to patient size and/or use of iterative reconstruction technique. CONTRAST:  OMNIPAQUE  IOHEXOL  300 MG/ML  SOLN COMPARISON:  07/12/2023 FINDINGS: Lower chest: Trace bilateral pleural effusions and minimal dependent lower lobe atelectasis. Small hiatal hernia. Hepatobiliary: Marked hepatic steatosis. No focal liver abnormality. Gallbladder is moderately distended, with no evidence of cholelithiasis or cholecystitis. Pancreas: Unremarkable. No pancreatic ductal dilatation or surrounding inflammatory changes. Spleen: Normal in size without focal abnormality. Adrenals/Urinary Tract: Kidneys enhance normally and symmetrically. No urinary tract calculi or obstruction. The adrenals and bladder are unremarkable. Stomach/Bowel: No bowel obstruction or ileus. There is diffuse colonic wall thickening compatible with inflammatory or infectious colitis. Additionally, there is thickening of the gastric rugal folds which could reflect gastritis. Vascular/Lymphatic: Aortic atherosclerosis. No enlarged abdominal or pelvic lymph nodes. Reproductive: Status post hysterectomy. No adnexal masses. Other: Small volume ascites, most pronounced in the lower pelvis.  No free intraperitoneal gas. No abdominal wall hernia. Musculoskeletal: Stable postsurgical changes of the left hip. No acute or destructive bony abnormalities. Reconstructed images demonstrate no additional findings. IMPRESSION: 1. Diffuse colonic wall thickening compatible with inflammatory or infectious colitis. 2. Thickening of the gastric rugal folds which may reflect gastritis. 3. Severe hepatic steatosis. 4. Small volume ascites. 5. Trace bilateral pleural effusions. 6. Small hiatal hernia. 7.  Aortic Atherosclerosis (ICD10-I70.0). Electronically Signed   By: Ozell Daring M.D.   On: 01/22/2024 16:53   CT C-SPINE NO CHARGE Result Date: 01/22/2024 EXAM: CT CERVICAL SPINE WITHOUT CONTRAST 01/22/2024 04:35:27 PM TECHNIQUE: CT of the cervical spine was performed without the administration of intravenous contrast. Multiplanar reformatted images are provided for review. Automated exposure control, iterative reconstruction, and/or weight based adjustment of the mA/kV was utilized to reduce the radiation dose to as low as reasonably achievable. COMPARISON: None available. CLINICAL HISTORY: FINDINGS: CERVICAL SPINE: BONES AND ALIGNMENT: Straightening of the normal cervical lordosis is present. Slight degenerative retrolisthesis is present at C5-C6. No acute fracture or traumatic malalignment. DEGENERATIVE CHANGES: Slight degenerative retrolisthesis is present at C5-C6. SOFT TISSUES: Atherosclerotic changes are present at the carotid bifurcations without significant stenosis. No prevertebral soft tissue swelling. LUNGS: Centrilobular emphysematous changes are noted. IMPRESSION: 1. No acute abnormality of the cervical  spine. 2. Straightening of the normal cervical lordosis and slight degenerative retrolisthesis at C5-C6. 3. Centrilobular emphysematous changes are noted; consider evaluation for eligibility for low-dose CT lung cancer screening program given emphysema as an independent risk factor for lung cancer in  appropriate age groups. Electronically signed by: Lonni Necessary MD 01/22/2024 04:51 PM EST RP Workstation: [id number]   CT Soft Tissue Neck W Contrast Result Date: 01/22/2024 EXAM: CT NECK WITH CONTRAST 01/22/2024 04:35:27 PM TECHNIQUE: CT of the neck was performed with the administration of 100 mL of iohexol  (OMNIPAQUE ) 300 MG/ML solution. Multiplanar reformatted images are provided for review. Automated exposure control, iterative reconstruction, and/or weight based adjustment of the mA/kV was utilized to reduce the radiation dose to as low as reasonably achievable. COMPARISON: None available. CLINICAL HISTORY: Recurrent postprandial emphysema. Right sided dysphagia. Question foreign body. FINDINGS: AERODIGESTIVE TRACT: Mucosal enhancement in the oropharynx may reflect pharyngitis. No discrete mass lesion or foreign body is present. No edema. SALIVARY GLANDS: The parotid and submandibular glands are unremarkable. THYROID : Unremarkable. LYMPH NODES: No suspicious cervical lymphadenopathy. SOFT TISSUES: No mass or fluid collection. BRAIN, ORBITS, SINUSES AND MASTOIDS: No acute abnormality. LUNGS AND MEDIASTINUM: Centrilobular emphysematous changes are present in both lungs. Mild dependent atelectasis is present. Atherosclerotic calcifications are present at the aortic arch and great vessel origins. BONES: No focal bone abnormality. VASCULATURE: Atherosclerotic calcifications are present at the carotid bifurcations bilaterally. No focal stenosis is present. IMPRESSION: 1. Mucosal enhancement in the oropharynx, possibly reflecting pharyngitis. No discrete mass lesion or foreign body identified. 2. Centrilobular emphysema. Consider evaluation for a low-dose CT lung cancer screening program given emphysema as an independent risk factor. 3. Atherosclerotic changes as detailed above, without significant stenosis. Electronically signed by: Lonni Necessary MD 01/22/2024 04:47 PM EST RP Workstation: [id number]    CT HEAD WO CONTRAST ( ) Result Date: 01/22/2024 EXAM: CT HEAD WITHOUT CONTRAST 01/22/2024 04:35:27 PM TECHNIQUE: CT of the head was performed without the administration of intravenous contrast. Automated exposure control, iterative reconstruction, and/or weight based adjustment of the mA/kV was utilized to reduce the radiation dose to as low as reasonably achievable. COMPARISON: CT head without contrast 07/08/2019. CLINICAL HISTORY: Headache, new onset (Age >= 51y). Fall 1 week ago. FINDINGS: BRAIN AND VENTRICLES: No acute hemorrhage. No evidence of acute infarct. Periventricular white matter decreased attenuation consistent with small vessel ischemic changes. Prominent ventricles, sulci and cisterns consistent with age-related involutional changes. Atrophy and white matter changes are moderately advanced for age with some progression since the prior exam. No hydrocephalus. No extra-axial collection. No mass effect or midline shift. ORBITS: No acute abnormality. SINUSES: No acute abnormality. SOFT TISSUES AND SKULL: No acute soft tissue abnormality. No skull fracture. IMPRESSION: 1. No acute intracranial abnormality. 2. Moderately advanced age-related involutional changes and small vessel ischemic changes with some progression since the prior exam. Electronically signed by: Lonni Necessary MD 01/22/2024 04:40 PM EST RP Workstation: [id number]   DG Chest 2 View Result Date: 01/22/2024 CLINICAL DATA:  Poor oral intake. EXAM: DG CHEST 2V COMPARISON:  05/09/2022. FINDINGS: The heart size and mediastinal contours are within normal limits. No consolidation is seen. There are likely small bilateral pleural effusions with a associated atelectasis or infiltrate on the lateral view. Degenerative changes are present in the thoracic spine. Old rib fractures are present in the ribs on the right. No acute osseous abnormality is seen. IMPRESSION: Small bilateral pleural effusions with atelectasis or infiltrate.  Electronically Signed   By: Leita Birmingham M.D.   On: 01/22/2024  16:01   Data Reviewed for HPI: Relevant notes from primary care and specialist visits, past discharge summaries as available in EHR, including Care Everywhere. Prior diagnostic testing as pertinent to current admission diagnoses Updated medications and problem lists for reconciliation ED course, including vitals, labs, imaging, treatment and response to treatment Triage notes, nursing and pharmacy notes and ED provider's notes Notable results as noted above in HPI      Assessment and Plan: No notes have been filed under this hospital service. Service: Hospitalist       DVT prophylaxis: Lovenox ***  Consults: none***  Advance Care Planning:   Code Status: Prior ***  Family Communication: none***  Disposition Plan: Back to previous home environment  Severity of Illness: {Observation/Inpatient:21159}  Author: Delayne LULLA Solian, MD 01/22/2024 11:46 PM  For on call review [email or web address].

## 2024-01-22 NOTE — Hospital Course (Signed)
 SABRA

## 2024-01-22 NOTE — ED Notes (Addendum)
 BG 44 was from R middle finger  BG 51 is from L middle finger The previous BG 571 was from blood from IV where D10 was given

## 2024-01-22 NOTE — ED Provider Notes (Addendum)
 Procedures  ----------------------------------------- 9:54 PM on 01/22/2024 -----------------------------------------  Labs reveal elevated LFTs.  CT was overall negative for acute findings other than colitis and esophagitis.SABRA  Ultrasound was obtained of right upper quadrant which does show distended gallbladder but no gallstones, no frank signs of cholecystitis.  Patient does have mild right upper quadrant tenderness on exam currently.  May need HIDA scan tomorrow.  Also has persistent hypoglycemia.  Blood sugar 35.  Will give D50, start D10 infusion, will admit.   ----------------------------------------- 10:39 PM on 01/22/2024 ----------------------------------------- Case d/w hospitalist    Final diagnoses:  Hypotension due to hypovolemia  Hypoglycemia  QT prolongation  Hypokalemia        Viviann Pastor, MD 01/22/24 2155    Viviann Pastor, MD 01/22/24 2239

## 2024-01-22 NOTE — ED Triage Notes (Signed)
 Pt coming from home via EMS. Called EMS for poor oral intake since Tuesday. Pt reports having diarrhea twice since Thursday and vomiting since Tuesday whenever she attempts to eat solids. Pt reports being able to keep down some fluids. EMS reports pt not on meds, only vitamins. Pt does smoke.   EMS vitals BP 82/60 HR 100 BG 62  EMS meds 100 g of D10 100 mL of NS

## 2024-01-22 NOTE — ED Provider Notes (Addendum)
 [facility] Provider Note    Event Date/Time   First MD Initiated Contact with Patient 01/22/24 1223     (approximate)   History   Failure To Thrive   HPI  Claire Brown is a 63 y.o. female with history of alcohol use, anemia, anxiety, bipolar who comes in with concerns for failure to thrive.  Patient reports multiple episodes of nausea, vomiting.  She denies any known falls but does report that every time she tries to eat that she vomits up.  This can occur right after eating and sometimes a few hours after eating.  She denies any chest pain, shortness of breath.  Does report a history of some issues with her electrolytes requiring potassium and magnesium  supplementation.  She reports having some new right sided weakness but states has been ongoing for some time now.  Patient does report having a fall about a week ago.  States that she did hit her head.  She reports some pain with trying to swallow with some discomfort on the right side.  She reports feeling as if there might be something stuck in there but then she states that she still able to swallow soups and fluids.  Only when she tries to follow swallow foods that she has a difficulty.  She denies any chest pain, shortness of breath  I reviewed the note where patient was admitted in May 2021 with alcohol ketoacidosis  Physical Exam   Triage Vital Signs: ED Triage Vitals  Encounter Vitals Group     BP 01/22/24 1230 (!) 84/68     Girls Systolic BP Percentile --      Girls Diastolic BP Percentile --      Boys Systolic BP Percentile --      Boys Diastolic BP Percentile --      Pulse Rate 01/22/24 1230 89     Resp 01/22/24 1230 (!) 23     Temp 01/22/24 1230 98.7 F (37.1 C)     Temp Source 01/22/24 1230 Oral     SpO2 01/22/24 1230 100 %     Weight 01/22/24 1232 112 lb 11.2 oz (51.1 kg)     Height 01/22/24 1232 5' (1.524 m)     Head Circumference --      Peak Flow --      Pain Score 01/22/24 1231  4     Pain Loc --      Pain Education --      Exclude from Growth Chart --     Most recent vital signs: Vitals:   01/22/24 1230  BP: (!) 84/68  Pulse: 89  Resp: (!) 23  Temp: 98.7 F (37.1 C)  SpO2: 100%     General: Awake, no distress.  CV:  Good peripheral perfusion.  Resp:  Normal effort.  Abd:  No distention.  Other:  Slight weakness on the right side but she does report having prior injury to her right shoulder..  Oropharynx appears normal.  Abdomen appears soft and nontender.  Patient appears older than stated age.   ED Results / Procedures / Treatments   Labs (all labs ordered are listed, but only abnormal results are displayed) Labs Reviewed  CBG MONITORING, ED - Abnormal; Notable for the following components:      Result Value   Glucose-Capillary 571 (*)    All other components within normal limits  CBG MONITORING, ED - Abnormal; Notable for the following components:   Glucose-Capillary 44 (*)  All other components within normal limits  CBG MONITORING, ED - Abnormal; Notable for the following components:   Glucose-Capillary 51 (*)    All other components within normal limits  CBG MONITORING, ED - Abnormal; Notable for the following components:   Glucose-Capillary 134 (*)    All other components within normal limits  RESP PANEL BY RT-PCR (RSV, FLU A&B, COVID)  RVPGX2  CBC WITH DIFFERENTIAL/PLATELET  COMPREHENSIVE METABOLIC PANEL WITH GFR  BLOOD GAS, VENOUS  SALICYLATE LEVEL  ACETAMINOPHEN  LEVEL  MAGNESIUM   TSH  T4, FREE  ETHANOL  URINE DRUG SCREEN  BETA-HYDROXYBUTYRIC ACID  TROPONIN T, HIGH SENSITIVITY  TROPONIN T, HIGH SENSITIVITY     EKG  My interpretation of EKG:  Sinus with a rate of 82 without any ST elevation, QTc is prolonged at 646,  EKG is sinus rhythm 93 without any ST elevation and questionable prolonged QTc at 610  RADIOLOGY Pending    PROCEDURES:  Critical Care performed: Yes, see critical care procedure note(s)  .1-3  Lead EKG Interpretation  Performed by: Ernest Ronal BRAVO, MD Authorized by: Ernest Ronal BRAVO, MD     Interpretation: normal     ECG rate:  80   ECG rate assessment: normal     Rhythm: sinus rhythm     Ectopy: none     Conduction: normal   .Critical Care  Performed by: Ernest Ronal BRAVO, MD Authorized by: Ernest Ronal BRAVO, MD   Critical care provider statement:    Critical care time (minutes):  30   Critical care was necessary to treat or prevent imminent or life-threatening deterioration of the following conditions:  Endocrine crisis   Critical care was time spent personally by me on the following activities:  Development of treatment plan with patient or surrogate, discussions with consultants, evaluation of patient's response to treatment, examination of patient, ordering and review of laboratory studies, ordering and review of radiographic studies, ordering and performing treatments and interventions, pulse oximetry, re-evaluation of patient's condition and review of old charts    MEDICATIONS ORDERED IN ED: Medications  dextrose  50 % solution 50 mL (50 mLs Intravenous Given 01/22/24 1319)  ondansetron  (ZOFRAN ) injection 4 mg (4 mg Intravenous Given 01/22/24 1332)  sodium chloride  0.9 % bolus 1,000 mL (1,000 mLs Intravenous New Bag/Given 01/22/24 1331)     IMPRESSION / MDM / ASSESSMENT AND PLAN / ED COURSE  I reviewed the triage vital signs and the nursing notes.   Patient's presentation is most consistent with acute presentation with potential threat to life or bodily function.   Patient comes in hypotensive with concerns for not able to eat or drink.  I suspect hypotension is related to hypovolemia.  Will give 1 L of fluids.  Patient is afebrile so infection seems less likely.   She initially states just not feeling hungry but then later states that she feels like she has difficulty swallowing on the right side.  No signs for infection on exam.  She also reports some right-sided weakness but  unclear to say how long this been going on but does report a fall about a week ago therefore we will get CT imaging evaluate for intercranial hemorrhage, stroke, cervical fracture, neck mass.  Given her inability to tolerate p.o. will also get a CT abdomen to make sure no obstruction perforation or other acute pathology.  Patient's EKG does show prolonged QTc so keep patient with a cardiac monitor and check out electrolytes including magnesium .  Patient's glucose was significantly low.  The initial glucose was drawn off the line that the dextrose  was going therefore I did repeat the fingerstick glucoses and these were also low so given some D50 as I am not sure if patient could have tolerated eating and drinking.  Her repeat sugar was reassuring.  Patient will be handed off to oncoming team pending CT imaging and final disposition.  LFTs are elevated she denies any alcohol use, Tylenol  use IV drug use.  CT imaging is already pending.  Patient's potassium levels are low so we will order some repletion for this as well.  The patient is on the cardiac monitor to evaluate for evidence of arrhythmia and/or significant heart rate changes.      FINAL CLINICAL IMPRESSION(S) / ED DIAGNOSES   Final diagnoses:  Hypotension due to hypovolemia  Hypoglycemia  QT prolongation     Rx / DC Orders   ED Discharge Orders     None        Note:  This document was prepared using Dragon voice recognition software and may include unintentional dictation errors.   Ernest Ronal BRAVO, MD 01/22/24 1524    Ernest Ronal BRAVO, MD 01/22/24 [phone]

## 2024-01-22 NOTE — ED Notes (Signed)
 Called ccmd for monitoring

## 2024-01-23 DIAGNOSIS — E162 Hypoglycemia, unspecified: Secondary | ICD-10-CM

## 2024-01-23 DIAGNOSIS — R112 Nausea with vomiting, unspecified: Secondary | ICD-10-CM

## 2024-01-23 DIAGNOSIS — E8809 Other disorders of plasma-protein metabolism, not elsewhere classified: Secondary | ICD-10-CM

## 2024-01-23 DIAGNOSIS — R748 Abnormal levels of other serum enzymes: Secondary | ICD-10-CM

## 2024-01-23 DIAGNOSIS — I959 Hypotension, unspecified: Secondary | ICD-10-CM

## 2024-01-23 DIAGNOSIS — K7581 Nonalcoholic steatohepatitis (NASH): Secondary | ICD-10-CM | POA: Diagnosis not present

## 2024-01-23 DIAGNOSIS — E876 Hypokalemia: Secondary | ICD-10-CM | POA: Diagnosis not present

## 2024-01-23 DIAGNOSIS — E861 Hypovolemia: Secondary | ICD-10-CM | POA: Diagnosis not present

## 2024-01-23 DIAGNOSIS — F1090 Alcohol use, unspecified, uncomplicated: Secondary | ICD-10-CM | POA: Insufficient documentation

## 2024-01-23 DIAGNOSIS — K529 Noninfective gastroenteritis and colitis, unspecified: Secondary | ICD-10-CM

## 2024-01-23 LAB — COMPREHENSIVE METABOLIC PANEL WITH GFR
ALT: 361 U/L — ABNORMAL HIGH (ref 0–44)
AST: 1734 U/L — ABNORMAL HIGH (ref 15–41)
Albumin: 1.6 g/dL — ABNORMAL LOW (ref 3.5–5.0)
Alkaline Phosphatase: 632 U/L — ABNORMAL HIGH (ref 38–126)
Anion gap: 13 (ref 5–15)
BUN: 8 mg/dL (ref 8–23)
CO2: 22 mmol/L (ref 22–32)
Calcium: 7.1 mg/dL — ABNORMAL LOW (ref 8.9–10.3)
Chloride: 102 mmol/L (ref 98–111)
Creatinine, Ser: 0.72 mg/dL (ref 0.44–1.00)
GFR, Estimated: >60 mL/min (ref >60)
Glucose, Bld: 115 mg/dL — ABNORMAL HIGH (ref 70–99)
Potassium: 4.2 mmol/L (ref 3.5–5.1)
Sodium: 136 mmol/L (ref 135–145)
Total Bilirubin: 2.7 mg/dL — ABNORMAL HIGH (ref 0.0–1.2)
Total Protein: 4 g/dL — ABNORMAL LOW (ref 6.5–8.1)

## 2024-01-23 LAB — HEPATITIS PANEL, ACUTE
HCV Ab: NONREACTIVE
Hep A IgM: NONREACTIVE
Hep B C IgM: NONREACTIVE
Hepatitis B Surface Ag: NONREACTIVE

## 2024-01-23 LAB — CBG MONITORING, ED
Glucose-Capillary: 169 mg/dL — ABNORMAL HIGH (ref 70–99)
Glucose-Capillary: 146 mg/dL — ABNORMAL HIGH (ref 70–99)
Glucose-Capillary: 111 mg/dL — ABNORMAL HIGH (ref 70–99)
Glucose-Capillary: 101 mg/dL — ABNORMAL HIGH (ref 70–99)
Glucose-Capillary: 119 mg/dL — ABNORMAL HIGH (ref 70–99)
Glucose-Capillary: 112 mg/dL — ABNORMAL HIGH (ref 70–99)
Glucose-Capillary: 124 mg/dL — ABNORMAL HIGH (ref 70–99)
Glucose-Capillary: 101 mg/dL — ABNORMAL HIGH (ref 70–99)

## 2024-01-23 LAB — CBC
HCT: 29.6 % — ABNORMAL LOW (ref 36.0–46.0)
Hemoglobin: 10.4 g/dL — ABNORMAL LOW (ref 12.0–15.0)
MCH: 39.8 pg — ABNORMAL HIGH (ref 26.0–34.0)
MCHC: 35.1 g/dL (ref 30.0–36.0)
MCV: 113.4 fL — ABNORMAL HIGH (ref 80.0–100.0)
Platelets: 106 K/uL — ABNORMAL LOW (ref 150–400)
RBC: 2.61 MIL/uL — ABNORMAL LOW (ref 3.87–5.11)
RDW: 18.4 % — ABNORMAL HIGH (ref 11.5–15.5)
WBC: 8.1 K/uL (ref 4.0–10.5)
nRBC: 0.4 % — ABNORMAL HIGH (ref 0.0–0.2)

## 2024-01-23 LAB — PHOSPHORUS: Phosphorus: 2.8 mg/dL (ref 2.5–4.6)

## 2024-01-23 LAB — GLUCOSE, CAPILLARY
Glucose-Capillary: 95 mg/dL (ref 70–99)
Glucose-Capillary: 79 mg/dL (ref 70–99)

## 2024-01-23 LAB — CORTISOL-AM, BLOOD: Cortisol - AM: 9.9 ug/dL (ref 6.7–22.6)

## 2024-01-23 LAB — MAGNESIUM: Magnesium: 1.8 mg/dL (ref 1.7–2.4)

## 2024-01-23 MED ORDER — GERHARDT'S BUTT CREAM
TOPICAL_CREAM | Freq: Two times a day (BID) | CUTANEOUS | Status: DC
  Administered 2024-01-25 – 2024-01-26 (×2): 1 via TOPICAL
  Filled 2024-01-23 (×2): qty 60

## 2024-01-23 MED ORDER — MAGNESIUM SULFATE 2 GM/50ML IV SOLN
2.0000 g | Freq: Once | INTRAVENOUS | Status: AC
  Administered 2024-01-23: 2 g via INTRAVENOUS
  Filled 2024-01-23: qty 50

## 2024-01-23 MED ORDER — THIAMINE HCL 100 MG/ML IJ SOLN
100.0000 mg | Freq: Every day | INTRAMUSCULAR | Status: DC
  Administered 2024-01-28: 100 mg via INTRAVENOUS
  Filled 2024-01-23 (×2): qty 2

## 2024-01-23 MED ORDER — FOLIC ACID 1 MG PO TABS
1.0000 mg | ORAL_TABLET | Freq: Every day | ORAL | Status: DC
  Administered 2024-01-23 – 2024-01-26 (×4): 1 mg via ORAL
  Filled 2024-01-23 (×5): qty 1

## 2024-01-23 MED ORDER — LORAZEPAM 1 MG PO TABS: 1.0000 mg | ORAL_TABLET | ORAL | Status: AC | PRN

## 2024-01-23 MED ORDER — LACTULOSE 10 GM/15ML PO SOLN
20.0000 g | Freq: Two times a day (BID) | ORAL | Status: DC
  Administered 2024-01-23: 20 g via ORAL

## 2024-01-23 MED ORDER — ENOXAPARIN SODIUM 40 MG/0.4ML IJ SOSY
40.0000 mg | PREFILLED_SYRINGE | INTRAMUSCULAR | Status: DC
  Administered 2024-01-23 – 2024-01-27 (×5): 40 mg via SUBCUTANEOUS
  Filled 2024-01-23 (×5): qty 0.4

## 2024-01-23 MED ORDER — ALBUMIN HUMAN 25 % IV SOLN
12.5000 g | Freq: Once | INTRAVENOUS | Status: AC
  Administered 2024-01-23: 12.5 g via INTRAVENOUS
  Filled 2024-01-23: qty 50

## 2024-01-23 MED ORDER — LORAZEPAM 2 MG/ML IJ SOLN: 1.0000 mg | INTRAMUSCULAR | Status: AC | PRN

## 2024-01-23 MED ORDER — POTASSIUM CHLORIDE 20 MEQ PO PACK
20.0000 meq | PACK | Freq: Every day | ORAL | Status: DC
  Administered 2024-01-24 – 2024-01-25 (×2): 20 meq via ORAL
  Filled 2024-01-23 (×3): qty 1

## 2024-01-23 MED ORDER — KCL-LACTATED RINGERS-D5W 20 MEQ/L IV SOLN
INTRAVENOUS | Status: DC
  Filled 2024-01-23 (×4): qty 1000

## 2024-01-23 MED ORDER — DEXTROSE 50 % IV SOLN
1.0000 | INTRAVENOUS | Status: DC | PRN
  Administered 2024-01-26 – 2024-01-31 (×2): 50 mL via INTRAVENOUS
  Filled 2024-01-23 (×2): qty 50

## 2024-01-23 MED ORDER — MIDODRINE HCL 5 MG PO TABS
5.0000 mg | ORAL_TABLET | Freq: Three times a day (TID) | ORAL | Status: DC
  Administered 2024-01-23 – 2024-01-24 (×3): 5 mg via ORAL
  Filled 2024-01-23 (×3): qty 1

## 2024-01-23 MED ORDER — ADULT MULTIVITAMIN W/MINERALS CH
1.0000 | ORAL_TABLET | Freq: Every day | ORAL | Status: DC
  Administered 2024-01-23 – 2024-01-26 (×3): 1 via ORAL
  Filled 2024-01-23 (×5): qty 1

## 2024-01-23 MED ORDER — POTASSIUM CHLORIDE 10 MEQ/100ML IV SOLN
10.0000 meq | INTRAVENOUS | Status: DC
  Administered 2024-01-23 (×2): 10 meq via INTRAVENOUS
  Filled 2024-01-23: qty 100

## 2024-01-23 MED ORDER — ALBUMIN HUMAN 25 % IV SOLN
25.0000 g | Freq: Three times a day (TID) | INTRAVENOUS | Status: AC
  Administered 2024-01-23 (×3): 25 g via INTRAVENOUS
  Filled 2024-01-23 (×4): qty 100

## 2024-01-23 MED ORDER — DEXTROSE 5 % IV SOLN: INTRAVENOUS | Status: AC

## 2024-01-23 MED ORDER — THIAMINE MONONITRATE 100 MG PO TABS
100.0000 mg | ORAL_TABLET | Freq: Every day | ORAL | Status: DC
  Administered 2024-01-23 – 2024-01-26 (×4): 100 mg via ORAL
  Filled 2024-01-23 (×5): qty 1

## 2024-01-23 NOTE — Assessment & Plan Note (Signed)
 Creatinine 0.93 up from baseline of 0.55.  GFR still above 60 Possible hepatorenal syndrome IV hydration Monitor renal function

## 2024-01-23 NOTE — Progress Notes (Signed)
 PHARMACY CONSULT NOTE - FOLLOW UP  Pharmacy Consult for Electrolyte Monitoring and Replacement   Recent Labs: Potassium (mmol/L)  Date Value  01/23/2024 4.2   Magnesium  (mg/dL)  Date Value  87/91/7974 1.8   Calcium (mg/dL)  Date Value  87/91/7974 7.1 (L)   Albumin  (g/dL)  Date Value  87/91/7974 1.6 (L)   Phosphorus (mg/dL)  Date Value  87/92/7974 2.8   Sodium (mmol/L)  Date Value  01/23/2024 136     Assessment: 63 y.o. female with medical history significant for Bipolar mood disorder, alcohol and tobacco use disorder, history of admission in 2021 for alcoholic ketoacidosis, being admitted with severe steatohepatitis, AKI, hypoglycemia and electrolyte derangement.  Presented with a 1 week history of vomiting and inability to hold anything down, and difficulty swallowing.   D5LR w/ 20 mEq @ 125 ml/hr x 24 hours.  Albumin  12.5 g x 1 12/8   Goal of Therapy:  Electrolytes WNL   Plan:  Kcl 40 mEq daily ordered. Will decrease it to 20 mEq daily.  Mg 2 g IV x 1  F/u with AM labs.    Cathaleen GORMAN Blanch ,PharmD Clinical Pharmacist 01/23/2024 10:11 AM

## 2024-01-23 NOTE — Progress Notes (Addendum)
 PROGRESS NOTE    YASAMIN KAREL  FMW:[id number] DOB: 1960/10/24 DOA: 01/22/2024 PCP: Pcp, No   Brief Narrative:    ROXI HLAVATY is a 63 y.o. female with medical history significant for Bipolar mood disorder, alcohol and tobacco use disorder, history of admission in 2021 for alcoholic ketoacidosis, being admitted with severe steatohepatitis, AKI, hypoglycemia and electrolyte derangement.  She presented with a 1 week history of vomiting and inability to hold anything down, and difficulty swallowing.  She had 2 soft stools with the last one being about 5 days ago.  States started feeling weak about 2 weeks ago and has occasional right upper quadrant pain.  States that she no longer drinks heavily and drinks a glass of wine once a week, no beer and no liquor  With EMS, she was hypotensive and hypoglycemic and given an amp of D50 and started on D10.  No history of diabetes.  Of note, several years prior in 2017, patient had a somewhat similar presentation with hypotension/hypoglycemia (blood glucose as low as 20),hypothermia, hypokalemia/hypomagnesemia.  She had a negative insulinoma workup(c-peptide and pro-insulin levels elevated) and negative sulfonylurea quantitative screen.  Ultimately the etiology of her hypoglycemia was unknown.     In the ED she was persistently hypotensive with SBP in the mid 80s, pulse in the 80s vitals otherwise normal at Labs notable for normal WBC, mild anemia of 10.3 mild thrombocytopenia of 143.  Baseline hemoglobin is 12.3 and baseline platelets 279. Markedly abnormal LFTs with AST/ALT 1290/256, alk phos 712 and total bilirubin 2.7 Creatinine 0.93 up from baseline of 0.55 Potassium 2.5, magnesium  2.2 Alcohol, acetaminophen  and salicylate levels undetectable CK, TSH and troponin normal Respiratory viral panel negative UA and UDS negative EKG showed sinus at 82 with low voltage CT soft tissue neck reflecting possible pharyngitis CT abdomen and pelvis: Possible  inflammatory or infectious colitis and severe hepatic steatosis, possible gastritis RUQ ultrasound nonspecific gallbladder wall thickening   Patient started on oral and IV potassium repletion as well as dextrose    Assessment & Plan:   Principal Problem:   Steatohepatitis, severe Active Problems:   Hypokalemia due to excessive gastrointestinal loss of potassium   Hypoglycemia   Hypotension   AKI (acute kidney injury)   Acute gastroenteritis   Hypoalbuminemia   Anemia   Thrombocytopenia   Alcohol use disorder   * Steatohepatitis, severe Transaminitis Endorses only occasional alcohol use, previous heavy drinker, stopped in May 2025 Presents with vomiting, elevated AST/ALT, alk phos, bilirubin, INR, anemia thrombocytopenia Worsening AST/ALT, 1290-> 1734 ALT 256->361, total bili 2.7 CT abdomen and pelvis showing severe hepatic steatosis, possible colitis, possible gastritis Respiratory panel, hepatitis panel are negative IV hydration IV Protonix , IV antiemetics Dietary consult GI consult for additional recommendations Monitor LFTs   Hypokalemia due to excessive gastrointestinal loss of potassium Secondary to intractable vomiting and hypovolemia. Continue electrolyte replacement per pharmacy  Monitor daily labs  hypotension Blood pressure 80s over 70s with MAP 70s.  Will continue to monitor with goal MAP more than 65.  Suspect ATN secondary to renal hypoperfusion from dehydration and hypotension Continue IV fluid resuscitation.  Add albumin  25 g 3 times daily, 3 doses Midodrine  5 mg 3 times daily, If blood pressure remains unstable and MAP decreasing down to 65, start IV Levophed Monitor kidney function, creatinine 0.93 on presentation is improving to 0.72,   Hypoglycemia Suspect nutritional Similar presentation back in 2017 had a negative insulinoma workup Continue D5 infusion, frequent CBG checks and as needed D50  AKI (acute kidney injury) Creatinine 0.93 up from  baseline of 0.55.  GFR still above 60 Creatinine improving 0.72 Monitor intake and output.  No output has been charted.  Patient states she has been voiding   Hypoalbuminemia Suspect nutritional/related to liver disease Dietary consulted   Acute gastroenteritis Gastritis and colitis seen on CT Patient unable to hold anything down x 5 days, had 2 loose BMs since resolved IV Protonix  IV fluids, IV antiemetics and supportive care GI PCR panel if diarrhea returns Hold off on antibiotics.   Alcohol use disorder History of alcohol use disorder Patient not quantifying use EtOH level undetectable CIWA withdrawal protocol   Anemia Thrombocytopenia, mild Hemoglobin 10.3 down from 12.3 and platelets 143 down from 279  Suspect related to alcohol use Continue to monitor       DVT prophylaxis: Lovenox    Consults: GI   Advance Care Planning:   Code Status: Prior    Family Communication: none   Disposition Plan: Back to previous home environment   Severity of Illness: The appropriate patient status for this patient is OBSERVATION. Observation status is judged to be reasonable and necessary in order to provide the required intensity of service to ensure the patient's safety. The patient's presenting symptoms, physical exam findings, and initial radiographic and laboratory data in the context of their medical condition is felt to place them at decreased risk for further clinical deterioration. Furthermore, it is anticipated that the patient will be medically stable for discharge from the hospital within 2 midnights of admission.   Subjective:  Patient seen and examined at the bedside.  Blood pressure has been borderline low with systolic 80s to 90s but MAP mostly more than 70s.  Patient is tolerating it well.  O2 sats 94 to 97% on room air. Urine output has not been charted but patient states she has been voiding okay.  Objective: Vitals:   01/23/24 1015 01/23/24 1130 01/23/24 1200  01/23/24 1230  BP: 90/66 (!) 89/70 91/70 (!) 81/66  Pulse: 95 95 95 91  Resp: (!) 21 (!) 23 (!) 21 19  Temp:      TempSrc:      SpO2: 99% 94% 97% 94%  Weight:      Height:        Intake/Output Summary (Last 24 hours) at 01/23/2024 1338 Last data filed at 01/23/2024 1217 Gross per 24 hour  Intake 438.02 ml  Output --  Net 438.02 ml   Filed Weights   01/22/24 1232  Weight: 51.1 kg    Examination:     Data Reviewed: I have personally reviewed following labs and imaging studies  CBC: Recent Labs  Lab 01/22/24 1428 01/23/24 0543  WBC 8.0 8.1  NEUTROABS 6.4  --   HGB 10.3* 10.4*  HCT 29.4* 29.6*  MCV 113.1* 113.4*  PLT 143* 106*   Basic Metabolic Panel: Recent Labs  Lab 01/22/24 1428 01/22/24 1917 01/23/24 0900  NA 139  --  136  K 2.5*  --  4.2  CL 94*  --  102  CO2 30  --  22  GLUCOSE 110*  --  115*  BUN 9  --  8  CREATININE 0.93  --  0.72  CALCIUM 7.9*  --  7.1*  MG 2.2  --  1.8  PHOS  --  2.8  --    GFR: Estimated Creatinine Clearance: 52.4 mL/min (by C-G formula based on SCr of 0.72 mg/dL). Liver Function Tests: Recent Labs  Lab 01/22/24  1428 01/23/24 0900  AST 1,290* 1,734*  ALT 256* 361*  ALKPHOS 712* 632*  BILITOT 2.7* 2.7*  PROT 4.3* 4.0*  ALBUMIN  1.9* 1.6*   No results for input(s): LIPASE, AMYLASE in the last 168 hours. No results for input(s): AMMONIA in the last 168 hours. Coagulation Profile: No results for input(s): INR, PROTIME in the last 168 hours. Cardiac Enzymes: Recent Labs  Lab 01/22/24 1917  CKTOTAL 122   BNP (last 3 results) No results for input(s): PROBNP in the last 8760 hours. HbA1C: No results for input(s): HGBA1C in the last 72 hours. CBG: Recent Labs  Lab 01/23/24 0223 01/23/24 0423 01/23/24 0652 01/23/24 0920 01/23/24 1151  GLUCAP 146* 111* 101* 119* 112*   Lipid Profile: No results for input(s): CHOL, HDL, LDLCALC, TRIG, CHOLHDL, LDLDIRECT in the last 72 hours. Thyroid   Function Tests: Recent Labs    01/22/24 1428  TSH 1.170  FREET4 0.70   Anemia Panel: No results for input(s): VITAMINB12, FOLATE, FERRITIN, TIBC, IRON, RETICCTPCT in the last 72 hours. Sepsis Labs: No results for input(s): PROCALCITON, LATICACIDVEN in the last 168 hours.  Recent Results (from the past 240 hours)  Resp panel by RT-PCR (RSV, Flu A&B, Covid) Anterior Nasal Swab     Status: None   Collection Time: 01/22/24  1:06 PM   Specimen: Anterior Nasal Swab  Result Value Ref Range Status   SARS Coronavirus 2 by RT PCR NEGATIVE NEGATIVE Final    Comment: (NOTE) SARS-CoV-2 target nucleic acids are NOT DETECTED.  The SARS-CoV-2 RNA is generally detectable in upper respiratory specimens during the acute phase of infection. The lowest concentration of SARS-CoV-2 viral copies this assay can detect is 138 copies/mL. A negative result does not preclude SARS-Cov-2 infection and should not be used as the sole basis for treatment or other patient management decisions. A negative result may occur with  improper specimen collection/handling, submission of specimen other than nasopharyngeal swab, presence of viral mutation(s) within the areas targeted by this assay, and inadequate number of viral copies(<138 copies/mL). A negative result must be combined with clinical observations, patient history, and epidemiological information. The expected result is Negative.  Fact Sheet for Patients:  bloggercourse.com  Fact Sheet for Healthcare Providers:  seriousbroker.it  This test is no t yet approved or cleared by the [place]  FDA and  has been authorized for detection and/or diagnosis of SARS-CoV-2 by FDA under an Emergency Use Authorization (EUA). This EUA will remain  in effect (meaning this test can be used) for the duration of the COVID-19 declaration under Section 564(b)(1) of the Act, 21 U.S.C.section  360bbb-3(b)(1), unless the authorization is terminated  or revoked sooner.       Influenza A by PCR NEGATIVE NEGATIVE Final   Influenza B by PCR NEGATIVE NEGATIVE Final    Comment: (NOTE) The Xpert Xpress SARS-CoV-2/FLU/RSV plus assay is intended as an aid in the diagnosis of influenza from Nasopharyngeal swab specimens and should not be used as a sole basis for treatment. Nasal washings and aspirates are unacceptable for Xpert Xpress SARS-CoV-2/FLU/RSV testing.  Fact Sheet for Patients: bloggercourse.com  Fact Sheet for Healthcare Providers: seriousbroker.it  This test is not yet approved or cleared by the [place]  FDA and has been authorized for detection and/or diagnosis of SARS-CoV-2 by FDA under an Emergency Use Authorization (EUA). This EUA will remain in effect (meaning this test can be used) for the duration of the COVID-19 declaration under Section 564(b)(1) of the Act, 21 U.S.C. section 360bbb-3(b)(1),  unless the authorization is terminated or revoked.     Resp Syncytial Virus by PCR NEGATIVE NEGATIVE Final    Comment: (NOTE) Fact Sheet for Patients: bloggercourse.com  Fact Sheet for Healthcare Providers: seriousbroker.it  This test is not yet approved or cleared by the [place]  FDA and has been authorized for detection and/or diagnosis of SARS-CoV-2 by FDA under an Emergency Use Authorization (EUA). This EUA will remain in effect (meaning this test can be used) for the duration of the COVID-19 declaration under Section 564(b)(1) of the Act, 21 U.S.C. section 360bbb-3(b)(1), unless the authorization is terminated or revoked.  Performed at [facility], [address] [place], [place] [address]          Radiology Studies: US  ABDOMEN LIMITED RUQ (LIVER/GB) Result Date: 01/22/2024 EXAM: Right Upper Quadrant Abdominal Ultrasound  TECHNIQUE: Real-time ultrasonography of the right upper quadrant of the abdomen was performed. COMPARISON: US  Abdomen Limited 07/09/2019. CLINICAL HISTORY: Abdomen pain, elevated liver function tests. FINDINGS: LIVER: Nodular hepatic contour. Increased hepatic echogenicity. No intrahepatic biliary ductal dilatation. No mass. BILIARY SYSTEM: Gallbladder wall thickening measures 4.2 mm. No evidence of pericholecystic fluid. No cholelithiasis. The common bile duct measures 2.3 mm. RIGHT KIDNEY: The right kidney is grossly unremarkable in appearances without evidence of hydronephrosis, echogenic calculi or worrisome mass lesions. PANCREAS: Visualized portions of the pancreas are unremarkable. OTHER: Small volume simple free fluid ascites. IMPRESSION: 1. Cirrhosis and hepatic steatosis. No focal liver lesions identified. Please note that liver protocol enhanced MR and CT are the most sensitive tests for the screening detection of hepatocellular carcinoma in the high risk setting of cirrhosis. 2. Nonspecific gallbladder wall thickening. This can be seen in the setting of chronic liver disease. 3. Small-volume simple ascites. Electronically signed by: Kate Plummer MD 01/22/2024 07:21 PM EST RP Workstation: [id number]   CT ABDOMEN PELVIS W CONTRAST Result Date: 01/22/2024 CLINICAL DATA:  Abdominal pain, diarrhea EXAM: CT ABDOMEN AND PELVIS WITH CONTRAST TECHNIQUE: Multidetector CT imaging of the abdomen and pelvis was performed using the standard protocol following bolus administration of intravenous contrast. RADIATION DOSE REDUCTION: This exam was performed according to the departmental dose-optimization program which includes automated exposure control, adjustment of the mA and/or kV according to patient size and/or use of iterative reconstruction technique. CONTRAST:  OMNIPAQUE  IOHEXOL  300 MG/ML  SOLN COMPARISON:  07/12/2023 FINDINGS: Lower chest: Trace bilateral pleural effusions and minimal dependent lower  lobe atelectasis. Small hiatal hernia. Hepatobiliary: Marked hepatic steatosis. No focal liver abnormality. Gallbladder is moderately distended, with no evidence of cholelithiasis or cholecystitis. Pancreas: Unremarkable. No pancreatic ductal dilatation or surrounding inflammatory changes. Spleen: Normal in size without focal abnormality. Adrenals/Urinary Tract: Kidneys enhance normally and symmetrically. No urinary tract calculi or obstruction. The adrenals and bladder are unremarkable. Stomach/Bowel: No bowel obstruction or ileus. There is diffuse colonic wall thickening compatible with inflammatory or infectious colitis. Additionally, there is thickening of the gastric rugal folds which could reflect gastritis. Vascular/Lymphatic: Aortic atherosclerosis. No enlarged abdominal or pelvic lymph nodes. Reproductive: Status post hysterectomy. No adnexal masses. Other: Small volume ascites, most pronounced in the lower pelvis. No free intraperitoneal gas. No abdominal wall hernia. Musculoskeletal: Stable postsurgical changes of the left hip. No acute or destructive bony abnormalities. Reconstructed images demonstrate no additional findings. IMPRESSION: 1. Diffuse colonic wall thickening compatible with inflammatory or infectious colitis. 2. Thickening of the gastric rugal folds which may reflect gastritis. 3. Severe hepatic steatosis. 4. Small volume ascites. 5. Trace bilateral pleural effusions. 6. Small hiatal  hernia. 7.  Aortic Atherosclerosis (ICD10-I70.0). Electronically Signed   By: Ozell Daring M.D.   On: 01/22/2024 16:53   CT C-SPINE NO CHARGE Result Date: 01/22/2024 EXAM: CT CERVICAL SPINE WITHOUT CONTRAST 01/22/2024 04:35:27 PM TECHNIQUE: CT of the cervical spine was performed without the administration of intravenous contrast. Multiplanar reformatted images are provided for review. Automated exposure control, iterative reconstruction, and/or weight based adjustment of the mA/kV was utilized to reduce  the radiation dose to as low as reasonably achievable. COMPARISON: None available. CLINICAL HISTORY: FINDINGS: CERVICAL SPINE: BONES AND ALIGNMENT: Straightening of the normal cervical lordosis is present. Slight degenerative retrolisthesis is present at C5-C6. No acute fracture or traumatic malalignment. DEGENERATIVE CHANGES: Slight degenerative retrolisthesis is present at C5-C6. SOFT TISSUES: Atherosclerotic changes are present at the carotid bifurcations without significant stenosis. No prevertebral soft tissue swelling. LUNGS: Centrilobular emphysematous changes are noted. IMPRESSION: 1. No acute abnormality of the cervical spine. 2. Straightening of the normal cervical lordosis and slight degenerative retrolisthesis at C5-C6. 3. Centrilobular emphysematous changes are noted; consider evaluation for eligibility for low-dose CT lung cancer screening program given emphysema as an independent risk factor for lung cancer in appropriate age groups. Electronically signed by: Lonni Necessary MD 01/22/2024 04:51 PM EST RP Workstation: [id number]   CT Soft Tissue Neck W Contrast Result Date: 01/22/2024 EXAM: CT NECK WITH CONTRAST 01/22/2024 04:35:27 PM TECHNIQUE: CT of the neck was performed with the administration of 100 mL of iohexol  (OMNIPAQUE ) 300 MG/ML solution. Multiplanar reformatted images are provided for review. Automated exposure control, iterative reconstruction, and/or weight based adjustment of the mA/kV was utilized to reduce the radiation dose to as low as reasonably achievable. COMPARISON: None available. CLINICAL HISTORY: Recurrent postprandial emphysema. Right sided dysphagia. Question foreign body. FINDINGS: AERODIGESTIVE TRACT: Mucosal enhancement in the oropharynx may reflect pharyngitis. No discrete mass lesion or foreign body is present. No edema. SALIVARY GLANDS: The parotid and submandibular glands are unremarkable. THYROID : Unremarkable. LYMPH NODES: No suspicious cervical  lymphadenopathy. SOFT TISSUES: No mass or fluid collection. BRAIN, ORBITS, SINUSES AND MASTOIDS: No acute abnormality. LUNGS AND MEDIASTINUM: Centrilobular emphysematous changes are present in both lungs. Mild dependent atelectasis is present. Atherosclerotic calcifications are present at the aortic arch and great vessel origins. BONES: No focal bone abnormality. VASCULATURE: Atherosclerotic calcifications are present at the carotid bifurcations bilaterally. No focal stenosis is present. IMPRESSION: 1. Mucosal enhancement in the oropharynx, possibly reflecting pharyngitis. No discrete mass lesion or foreign body identified. 2. Centrilobular emphysema. Consider evaluation for a low-dose CT lung cancer screening program given emphysema as an independent risk factor. 3. Atherosclerotic changes as detailed above, without significant stenosis. Electronically signed by: Lonni Necessary MD 01/22/2024 04:47 PM EST RP Workstation: [id number]   CT HEAD WO CONTRAST ( ) Result Date: 01/22/2024 EXAM: CT HEAD WITHOUT CONTRAST 01/22/2024 04:35:27 PM TECHNIQUE: CT of the head was performed without the administration of intravenous contrast. Automated exposure control, iterative reconstruction, and/or weight based adjustment of the mA/kV was utilized to reduce the radiation dose to as low as reasonably achievable. COMPARISON: CT head without contrast 07/08/2019. CLINICAL HISTORY: Headache, new onset (Age >= 51y). Fall 1 week ago. FINDINGS: BRAIN AND VENTRICLES: No acute hemorrhage. No evidence of acute infarct. Periventricular white matter decreased attenuation consistent with small vessel ischemic changes. Prominent ventricles, sulci and cisterns consistent with age-related involutional changes. Atrophy and white matter changes are moderately advanced for age with some progression since the prior exam. No hydrocephalus. No extra-axial collection. No mass effect or midline shift. ORBITS: No  acute abnormality. SINUSES: No  acute abnormality. SOFT TISSUES AND SKULL: No acute soft tissue abnormality. No skull fracture. IMPRESSION: 1. No acute intracranial abnormality. 2. Moderately advanced age-related involutional changes and small vessel ischemic changes with some progression since the prior exam. Electronically signed by: Lonni Necessary MD 01/22/2024 04:40 PM EST RP Workstation: [id number]   DG Chest 2 View Result Date: 01/22/2024 CLINICAL DATA:  Poor oral intake. EXAM: DG CHEST 2V COMPARISON:  05/09/2022. FINDINGS: The heart size and mediastinal contours are within normal limits. No consolidation is seen. There are likely small bilateral pleural effusions with a associated atelectasis or infiltrate on the lateral view. Degenerative changes are present in the thoracic spine. Old rib fractures are present in the ribs on the right. No acute osseous abnormality is seen. IMPRESSION: Small bilateral pleural effusions with atelectasis or infiltrate. Electronically Signed   By: Leita Birmingham M.D.   On: 01/22/2024 16:01        Scheduled Meds:  enoxaparin  (LOVENOX ) injection  40 mg Subcutaneous Q24H   folic acid   1 mg Oral Daily   midodrine   5 mg Oral TID WC   multivitamin with minerals  1 tablet Oral Daily   pantoprazole  (PROTONIX ) IV  40 mg Intravenous Q24H   [START ON 01/24/2024] potassium chloride   20 mEq Oral Daily   thiamine   100 mg Oral Daily   Or   thiamine   100 mg Intravenous Daily   Continuous Infusions:  albumin  human Stopped (01/23/24 1124)   dextrose             Derryl Duval, MD [facility] 01/23/2024, 1:38 PM

## 2024-01-23 NOTE — Assessment & Plan Note (Signed)
 Suspect nutritional Similar presentation back in 2017 had a negative insulinoma workup Continue hydration with D10 with frequent CBG checks and as needed D50

## 2024-01-23 NOTE — Assessment & Plan Note (Signed)
 History of alcohol use disorder Patient not quantifying use EtOH level undetectable CIWA withdrawal protocol

## 2024-01-23 NOTE — ED Notes (Signed)
 Lab to come and draw morning labs due to pt being a hard stick and previously drawn morning labs being hemolyzed.

## 2024-01-23 NOTE — Assessment & Plan Note (Signed)
 Gastritis and colitis seen on CT Patient unable to hold anything down x 5 days, had 2 loose BMs since resolved IV Protonix  IV fluids, IV antiemetics and supportive care GI PCR panel if diarrhea returns

## 2024-01-23 NOTE — ED Notes (Signed)
 This tech assisted pt to ambulate to the restroom at this time.

## 2024-01-23 NOTE — Progress Notes (Signed)
   Brief Progress Note   _____________________________________________________________________________________________________________  Patient Name: Claire Brown Patient DOB: 04-08-1960 Date: @TODAY @      Data: Reviewed labs, vital signs, and notes.    Action: No action required at this time.    Response:    _____________________________________________________________________________________________________________  [facility] RN Expeditor Karlene Southard [address] Please contact us  directly via secure chat (search for [facility]) or by calling us  at [phone] [facility]).

## 2024-01-23 NOTE — Assessment & Plan Note (Addendum)
 Secondary to intractable vomiting Received IV and oral repletion in the ED Pharmacy consulted for electrolyte management Magnesium  was normal Continue to monitor both and correct as needed

## 2024-01-23 NOTE — ED Notes (Signed)
 Messaged provider regarding patient's BP and potassium orders.

## 2024-01-23 NOTE — Assessment & Plan Note (Signed)
 Patient with albumin  of 1.9, muscle wasting with prominence of bony eminences in the setting of suspected alcohol use Dietary consult

## 2024-01-23 NOTE — Consult Note (Signed)
 Rogelia Copping, MD [facility]  [address] [place], [place] [address] Phone: [phone] Fax : [phone]  Consultation  Referring Provider:     Dr. Cleatus Primary Care Physician:  Pcp, No Primary Gastroenterologist: Sampson         Reason for Consultation:     Elevated liver enzyme  Date of Admission:  01/22/2024 Date of Consultation:  01/23/2024         HPI:   Claire Brown is a 63 y.o. female who has a significant past medical history for alcohol abuse bipolar disorder with a history of steatohepatitis acute kidney injury hypoglycemia and electrolyte derangements who is now coming to the ED with a history of nausea and vomiting for 1 week and inability to hold food down and a report of difficulty swallowing.  The patient was picked up by EMS and was found to be hypotensive and hypoglycemic and was given D50 and started on it glucose drip.  The patient has a similar presentation back in 2017 and a negative workup for an insulinoma.  The patient is able to give some history but ends up repeating her words over and over again.  The patient's liver enzymes in the past have shown:  Component     Latest Ref Rng 07/08/2019 07/12/2023  Albumin      3.5 - 5.0 g/dL 5.2 (H)  3.2 (L)   AST     15 - 41 U/L 127 (H)  31   ALT     0 - 44 U/L 100 (H)  31   Alkaline Phosphatase     38 - 126 U/L 108  133 (H)   Total Bilirubin     0.0 - 1.2 mg/dL 2.1 (H)  1.3 (H)    Component     Latest Ref Rng 01/22/2024 01/23/2024  Albumin      3.5 - 5.0 g/dL 1.9 (L)  1.6 (L)   AST     15 - 41 U/L 1,290 (H)  1,734 (H)   ALT     0 - 44 U/L 256 (H)  361 (H)   Alkaline Phosphatase     38 - 126 U/L 712 (H)  632 (H)   Total Bilirubin     0.0 - 1.2 mg/dL 2.7 (H)  2.7 (H)     The patient reports that she knows that she is in the hospital and that is 2025 and she knows that is December.  When asked further questions about when she stopped drinking she keeps repeating 2025 December The patient had  a CT scan of the abdomen yesterday that showed:  IMPRESSION: 1. Diffuse colonic wall thickening compatible with inflammatory or infectious colitis. 2. Thickening of the gastric rugal folds which may reflect gastritis. 3. Severe hepatic steatosis. 4. Small volume ascites. 5. Trace bilateral pleural effusions. 6. Small hiatal hernia. 7.  Aortic Atherosclerosis  The patient also had an ultrasound that showed:  IMPRESSION: 1. Cirrhosis and hepatic steatosis. No focal liver lesions identified. Please note that liver protocol enhanced MR and CT are the most sensitive tests for the screening detection of hepatocellular carcinoma in the high risk setting of cirrhosis. 2. Nonspecific gallbladder wall thickening. This can be seen in the setting of chronic liver disease. 3. Small-volume simple ascites    Past Medical History:  Diagnosis Date   Anemia    Anxiety    Arthritis    Bipolar disorder (HCC)    Headache  Past Surgical History:  Procedure Laterality Date   CESAREAN SECTION     CYSTOSCOPY N/A 10/14/2015   Procedure: CYSTOSCOPY;  Surgeon: Glory High, MD;  Location: [facility] ORS;  Service: Gynecology;  Laterality: N/A;   FRACTURE SURGERY Left 12/2015   Femoral Neck Pin Placement - Dr. Madelynn ([facility])   KNEE SURGERY Bilateral    LAPAROSCOPIC BILATERAL SALPINGO OOPHERECTOMY  10/14/2015   Procedure: LAPAROSCOPIC BILATERAL SALPINGO OOPHORECTOMY;  Surgeon: Glory High, MD;  Location: [facility] ORS;  Service: Gynecology;;   LAPAROSCOPIC HYSTERECTOMY N/A 10/14/2015   Procedure: HYSTERECTOMY TOTAL LAPAROSCOPIC;  Surgeon: Glory High, MD;  Location: [facility] ORS;  Service: Gynecology;  Laterality: N/A;    Prior to Admission medications   Medication Sig Start Date End Date Taking? Authorizing Provider  acetaminophen  (TYLENOL ) 325 MG tablet Take 650 mg by mouth every 12 (twelve) hours as needed for mild pain (pain score 1-3) or moderate pain (pain score 4-6).   Yes [provider]  dimenhyDRINATE (DRAMAMINE) 50 MG tablet Take 50 mg by mouth every 8 (eight) hours as needed.   Yes [provider]  diphenhydrAMINE  (BENADRYL ) 25 MG tablet Take 25 mg by mouth every 6 (six) hours as needed for allergies.   Yes [provider]  Multiple Vitamin (MULTIVITAMIN WITH MINERALS) TABS tablet Take 1 tablet by mouth daily.   Yes [provider]  Potassium Gluconate 550 MG TABS Take 550 mg by mouth every morning.   Yes [provider]    Family History  Problem Relation Age of Onset   Cancer Brother        loyphoma     Social History   Tobacco Use   Smoking status: Every Day    Current packs/day: 1.00    Types: Cigarettes   Smokeless tobacco: Never  Substance Use Topics   Alcohol use: Yes    Comment: OCC   Drug use: No    Allergies as of 01/22/2024 - Review Complete 01/22/2024  Allergen Reaction Noted   Amoxicillin-pot clavulanate Nausea Only and Nausea And Vomiting 06/21/2014   Oxycodone  Anxiety, Other (See Comments), and Dermatitis 10/09/2015   Penicillins Itching, Other (See Comments), and Hives 07/01/2015   Diazepam Anxiety, Other (See Comments), and Nausea Only 06/05/2015    Review of Systems:    All systems reviewed and negative except where noted in HPI.   Physical Exam:  Vital signs in last 24 hours: Temp:  [97.6 F (36.4 C)-98.2 F (36.8 C)] 97.8 F (36.6 C) (12/08 1403) Pulse Rate:  [81-98] 81 (12/08 1500) Resp:  [14-28] 17 (12/08 1500) BP: (75-98)/(59-76) 92/65 (12/08 1500) SpO2:  [74 %-100 %] 98 % (12/08 1500)   General:   Pleasant, cooperative in NAD Head:  Normocephalic and atraumatic. Eyes:   No icterus.   Conjunctiva pink. PERRLA. Ears:  Normal auditory acuity. Neck:  Supple; no masses or thyroidomegaly Lungs: Respirations even and unlabored. Lungs clear to auscultation bilaterally.   No wheezes, crackles, or rhonchi.  Heart:  Regular rate and rhythm;  Without murmur, clicks, rubs or  gallops Abdomen:  Soft, nondistended, nontender. Normal bowel sounds. No appreciable masses or hepatomegaly.  No rebound or guarding.  Rectal:  Not performed. Msk:  Symmetrical without gross deformities.    Extremities:  Without edema, cyanosis or clubbing. Neurologic:  Alert and oriented x3;  grossly normal neurologically. Skin:  Intact without significant lesions or rashes. Cervical Nodes:  No significant cervical adenopathy. Psych:  Alert and cooperative. Normal affect.  LAB RESULTS: Recent  Labs    01/22/24 1428 01/23/24 0543  WBC 8.0 8.1  HGB 10.3* 10.4*  HCT 29.4* 29.6*  PLT 143* 106*   BMET Recent Labs    01/22/24 1428 01/23/24 0900  NA 139 136  K 2.5* 4.2  CL 94* 102  CO2 30 22  GLUCOSE 110* 115*  BUN 9 8  CREATININE 0.93 0.72  CALCIUM 7.9* 7.1*   LFT Recent Labs    01/23/24 0900  PROT 4.0*  ALBUMIN  1.6*  AST 1,734*  ALT 361*  ALKPHOS 632*  BILITOT 2.7*   PT/INR No results for input(s): LABPROT, INR in the last 72 hours.  STUDIES: US  ABDOMEN LIMITED RUQ (LIVER/GB) Result Date: 01/22/2024 EXAM: Right Upper Quadrant Abdominal Ultrasound TECHNIQUE: Real-time ultrasonography of the right upper quadrant of the abdomen was performed. COMPARISON: US  Abdomen Limited 07/09/2019. CLINICAL HISTORY: Abdomen pain, elevated liver function tests. FINDINGS: LIVER: Nodular hepatic contour. Increased hepatic echogenicity. No intrahepatic biliary ductal dilatation. No mass. BILIARY SYSTEM: Gallbladder wall thickening measures 4.2 mm. No evidence of pericholecystic fluid. No cholelithiasis. The common bile duct measures 2.3 mm. RIGHT KIDNEY: The right kidney is grossly unremarkable in appearances without evidence of hydronephrosis, echogenic calculi or worrisome mass lesions. PANCREAS: Visualized portions of the pancreas are unremarkable. OTHER: Small volume simple free fluid ascites. IMPRESSION: 1. Cirrhosis and hepatic steatosis. No focal liver lesions identified. Please  note that liver protocol enhanced MR and CT are the most sensitive tests for the screening detection of hepatocellular carcinoma in the high risk setting of cirrhosis. 2. Nonspecific gallbladder wall thickening. This can be seen in the setting of chronic liver disease. 3. Small-volume simple ascites. Electronically signed by: Kate Plummer MD 01/22/2024 07:21 PM EST RP Workstation: [id number]   CT ABDOMEN PELVIS W CONTRAST Result Date: 01/22/2024 CLINICAL DATA:  Abdominal pain, diarrhea EXAM: CT ABDOMEN AND PELVIS WITH CONTRAST TECHNIQUE: Multidetector CT imaging of the abdomen and pelvis was performed using the standard protocol following bolus administration of intravenous contrast. RADIATION DOSE REDUCTION: This exam was performed according to the departmental dose-optimization program which includes automated exposure control, adjustment of the mA and/or kV according to patient size and/or use of iterative reconstruction technique. CONTRAST:  OMNIPAQUE  IOHEXOL  300 MG/ML  SOLN COMPARISON:  07/12/2023 FINDINGS: Lower chest: Trace bilateral pleural effusions and minimal dependent lower lobe atelectasis. Small hiatal hernia. Hepatobiliary: Marked hepatic steatosis. No focal liver abnormality. Gallbladder is moderately distended, with no evidence of cholelithiasis or cholecystitis. Pancreas: Unremarkable. No pancreatic ductal dilatation or surrounding inflammatory changes. Spleen: Normal in size without focal abnormality. Adrenals/Urinary Tract: Kidneys enhance normally and symmetrically. No urinary tract calculi or obstruction. The adrenals and bladder are unremarkable. Stomach/Bowel: No bowel obstruction or ileus. There is diffuse colonic wall thickening compatible with inflammatory or infectious colitis. Additionally, there is thickening of the gastric rugal folds which could reflect gastritis. Vascular/Lymphatic: Aortic atherosclerosis. No enlarged abdominal or pelvic lymph nodes. Reproductive: Status  post hysterectomy. No adnexal masses. Other: Small volume ascites, most pronounced in the lower pelvis. No free intraperitoneal gas. No abdominal wall hernia. Musculoskeletal: Stable postsurgical changes of the left hip. No acute or destructive bony abnormalities. Reconstructed images demonstrate no additional findings. IMPRESSION: 1. Diffuse colonic wall thickening compatible with inflammatory or infectious colitis. 2. Thickening of the gastric rugal folds which may reflect gastritis. 3. Severe hepatic steatosis. 4. Small volume ascites. 5. Trace bilateral pleural effusions. 6. Small hiatal hernia. 7.  Aortic Atherosclerosis (ICD10-I70.0). Electronically Signed   By: Ozell Delores HERO.D.  On: 01/22/2024 16:53   CT C-SPINE NO CHARGE Result Date: 01/22/2024 EXAM: CT CERVICAL SPINE WITHOUT CONTRAST 01/22/2024 04:35:27 PM TECHNIQUE: CT of the cervical spine was performed without the administration of intravenous contrast. Multiplanar reformatted images are provided for review. Automated exposure control, iterative reconstruction, and/or weight based adjustment of the mA/kV was utilized to reduce the radiation dose to as low as reasonably achievable. COMPARISON: None available. CLINICAL HISTORY: FINDINGS: CERVICAL SPINE: BONES AND ALIGNMENT: Straightening of the normal cervical lordosis is present. Slight degenerative retrolisthesis is present at C5-C6. No acute fracture or traumatic malalignment. DEGENERATIVE CHANGES: Slight degenerative retrolisthesis is present at C5-C6. SOFT TISSUES: Atherosclerotic changes are present at the carotid bifurcations without significant stenosis. No prevertebral soft tissue swelling. LUNGS: Centrilobular emphysematous changes are noted. IMPRESSION: 1. No acute abnormality of the cervical spine. 2. Straightening of the normal cervical lordosis and slight degenerative retrolisthesis at C5-C6. 3. Centrilobular emphysematous changes are noted; consider evaluation for eligibility for  low-dose CT lung cancer screening program given emphysema as an independent risk factor for lung cancer in appropriate age groups. Electronically signed by: Lonni Necessary MD 01/22/2024 04:51 PM EST RP Workstation: [id number]   CT Soft Tissue Neck W Contrast Result Date: 01/22/2024 EXAM: CT NECK WITH CONTRAST 01/22/2024 04:35:27 PM TECHNIQUE: CT of the neck was performed with the administration of 100 mL of iohexol  (OMNIPAQUE ) 300 MG/ML solution. Multiplanar reformatted images are provided for review. Automated exposure control, iterative reconstruction, and/or weight based adjustment of the mA/kV was utilized to reduce the radiation dose to as low as reasonably achievable. COMPARISON: None available. CLINICAL HISTORY: Recurrent postprandial emphysema. Right sided dysphagia. Question foreign body. FINDINGS: AERODIGESTIVE TRACT: Mucosal enhancement in the oropharynx may reflect pharyngitis. No discrete mass lesion or foreign body is present. No edema. SALIVARY GLANDS: The parotid and submandibular glands are unremarkable. THYROID : Unremarkable. LYMPH NODES: No suspicious cervical lymphadenopathy. SOFT TISSUES: No mass or fluid collection. BRAIN, ORBITS, SINUSES AND MASTOIDS: No acute abnormality. LUNGS AND MEDIASTINUM: Centrilobular emphysematous changes are present in both lungs. Mild dependent atelectasis is present. Atherosclerotic calcifications are present at the aortic arch and great vessel origins. BONES: No focal bone abnormality. VASCULATURE: Atherosclerotic calcifications are present at the carotid bifurcations bilaterally. No focal stenosis is present. IMPRESSION: 1. Mucosal enhancement in the oropharynx, possibly reflecting pharyngitis. No discrete mass lesion or foreign body identified. 2. Centrilobular emphysema. Consider evaluation for a low-dose CT lung cancer screening program given emphysema as an independent risk factor. 3. Atherosclerotic changes as detailed above, without significant  stenosis. Electronically signed by: Lonni Necessary MD 01/22/2024 04:47 PM EST RP Workstation: [id number]   CT HEAD WO CONTRAST ( ) Result Date: 01/22/2024 EXAM: CT HEAD WITHOUT CONTRAST 01/22/2024 04:35:27 PM TECHNIQUE: CT of the head was performed without the administration of intravenous contrast. Automated exposure control, iterative reconstruction, and/or weight based adjustment of the mA/kV was utilized to reduce the radiation dose to as low as reasonably achievable. COMPARISON: CT head without contrast 07/08/2019. CLINICAL HISTORY: Headache, new onset (Age >= 51y). Fall 1 week ago. FINDINGS: BRAIN AND VENTRICLES: No acute hemorrhage. No evidence of acute infarct. Periventricular white matter decreased attenuation consistent with small vessel ischemic changes. Prominent ventricles, sulci and cisterns consistent with age-related involutional changes. Atrophy and white matter changes are moderately advanced for age with some progression since the prior exam. No hydrocephalus. No extra-axial collection. No mass effect or midline shift. ORBITS: No acute abnormality. SINUSES: No acute abnormality. SOFT TISSUES AND SKULL: No acute soft tissue abnormality. No skull  fracture. IMPRESSION: 1. No acute intracranial abnormality. 2. Moderately advanced age-related involutional changes and small vessel ischemic changes with some progression since the prior exam. Electronically signed by: Lonni Necessary MD 01/22/2024 04:40 PM EST RP Workstation: [id number]   DG Chest 2 View Result Date: 01/22/2024 CLINICAL DATA:  Poor oral intake. EXAM: DG CHEST 2V COMPARISON:  05/09/2022. FINDINGS: The heart size and mediastinal contours are within normal limits. No consolidation is seen. There are likely small bilateral pleural effusions with a associated atelectasis or infiltrate on the lateral view. Degenerative changes are present in the thoracic spine. Old rib fractures are present in the ribs on the right. No acute  osseous abnormality is seen. IMPRESSION: Small bilateral pleural effusions with atelectasis or infiltrate. Electronically Signed   By: Leita Birmingham M.D.   On: 01/22/2024 16:01      Impression / Plan:   Assessment: Principal Problem:   Steatohepatitis, severe Active Problems:   Hypokalemia due to excessive gastrointestinal loss of potassium   Hypoglycemia   AKI (acute kidney injury)   Anemia   Thrombocytopenia   Alcohol use disorder   Hypotension   Acute gastroenteritis   Hypoalbuminemia   SHAMERA YARBERRY is a 63 y.o. y/o female with with a history of alcohol abuse and abnormal liver enzymes with nausea vomiting and possible gastritis and colitis.  The patient has been having electrolyte abnormalities and inability to fully answer questions today.  The patient had a negative acute hepatitis panel.  Plan:  This patient likely has an acute on chronic hepatitis.  The causes for her liver enzymes are without and are typically a medication, virus or ischemia.  The patient was noted to be hypotensive but is not definite at this is the cause of her present increase liver enzymes.  The patient's INR should be monitored for synthetic function to make sure that the liver is not failing.  I will send off B12, thiamine  and folate due to the patient's confusion.  I will also check other liver tests to rule out other possible causes of the abnormal enzymes.  Thank you for involving me in the care of this patient.      LOS: 1 day   Rogelia Copping, MD, MD. Claire Brown 01/23/2024, 3:21 PM,  Pager [phone] 7am-5pm  Check AMION for 5pm -7am coverage and on weekends   Note: This dictation was prepared with Dragon dictation along with smaller phrase technology. Any transcriptional errors that result from this process are unintentional.

## 2024-01-23 NOTE — Assessment & Plan Note (Signed)
 Thrombocytopenia, mild Hemoglobin 10.3 down from 12.3 and platelets 143 down from 279  Suspect related to alcohol use Continue to monitor

## 2024-01-23 NOTE — Assessment & Plan Note (Signed)
 Suspect secondary to protein calorie malnutrition from inadequate oral intake and alcohol use Dietary consult

## 2024-01-23 NOTE — ED Notes (Signed)
 Per MD MAP above 70

## 2024-01-23 NOTE — Assessment & Plan Note (Addendum)
 Endorses only occasional alcohol use, previous heavy drinker, stopped in May 2025 Possibly triggered by acute gastritis/colitis Presents with vomiting, elevated AST/ALT, alk phos, bilirubin, INR, anemia thrombocytopenia CT abdomen and pelvis showing severe hepatic steatosis, possible colitis, possible gastritis IV hydration IV Protonix , IV antiemetics Dietary consult GI consult for additional recommendations Monitor LFTs

## 2024-01-23 NOTE — ED Notes (Signed)
 This RN at bedside and patient had ripped out her IV. Pt states that she was sleeping and woke up confused. Pt A&Ox4 at this time.

## 2024-01-23 NOTE — Assessment & Plan Note (Signed)
 Suspect nutritional/related to liver disease Dietary consulted

## 2024-01-23 NOTE — Progress Notes (Signed)
 PHARMACY CONSULT NOTE - FOLLOW UP  Pharmacy Consult for Electrolyte Monitoring and Replacement   Recent Labs: Potassium (mmol/L)  Date Value  01/22/2024 2.5 (LL)   Magnesium  (mg/dL)  Date Value  87/92/7974 2.2   Calcium (mg/dL)  Date Value  87/92/7974 7.9 (L)   Albumin  (g/dL)  Date Value  87/92/7974 1.9 (L)   Phosphorus (mg/dL)  Date Value  94/71/7974 3.0   Sodium (mmol/L)  Date Value  01/22/2024 139     Assessment: 12/7 @ 1428:   K = 2.5                          Ca = 7.9,   Alb = 1.9,  Corrected Ca = 9.58   KCl 10 mEq IV X 1 given on 12/7 @ 1840 KCl 40 mEq PO X 1 given on 12/7 @ 1331   Goal of Therapy:  Electrolytes WNL   Plan:  KCl 10 mEq IV X 5 ordered to start 12/8 @ 0000 - recheck electrolytes on 12/8 with AM labs    Adelard Sanon D ,PharmD Clinical Pharmacist 01/23/2024 12:16 AM

## 2024-01-23 NOTE — Assessment & Plan Note (Signed)
 Suspect ATN secondary to renal hypoperfusion from dehydration and hypotension Continue IV fluid resuscitation

## 2024-01-24 LAB — GLUCOSE, CAPILLARY
Glucose-Capillary: 57 mg/dL — ABNORMAL LOW (ref 70–99)
Glucose-Capillary: 74 mg/dL (ref 70–99)
Glucose-Capillary: 81 mg/dL (ref 70–99)
Glucose-Capillary: 84 mg/dL (ref 70–99)
Glucose-Capillary: 85 mg/dL (ref 70–99)
Glucose-Capillary: 85 mg/dL (ref 70–99)
Glucose-Capillary: 85 mg/dL (ref 70–99)
Glucose-Capillary: 92 mg/dL (ref 70–99)
Glucose-Capillary: 92 mg/dL (ref 70–99)

## 2024-01-24 LAB — PROTIME-INR
INR: 3.4 — ABNORMAL HIGH (ref 0.8–1.2)
Prothrombin Time: 36.2 s — ABNORMAL HIGH (ref 11.4–15.2)

## 2024-01-24 LAB — HEPATIC FUNCTION PANEL
ALT: 161 U/L — ABNORMAL HIGH (ref 0–44)
AST: 450 U/L — ABNORMAL HIGH (ref 15–41)
Albumin: 2.7 g/dL — ABNORMAL LOW (ref 3.5–5.0)
Alkaline Phosphatase: 337 U/L — ABNORMAL HIGH (ref 38–126)
Bilirubin, Direct: 2.5 mg/dL — ABNORMAL HIGH (ref 0.0–0.2)
Indirect Bilirubin: 1.1 mg/dL — ABNORMAL HIGH (ref 0.3–0.9)
Total Bilirubin: 3.6 mg/dL — ABNORMAL HIGH (ref 0.0–1.2)
Total Protein: 4 g/dL — ABNORMAL LOW (ref 6.5–8.1)

## 2024-01-24 LAB — VITAMIN B12: Vitamin B-12: 2602 pg/mL — ABNORMAL HIGH (ref 180–914)

## 2024-01-24 LAB — FOLATE: Folate: 4.7 ng/mL — ABNORMAL LOW (ref >5.9)

## 2024-01-24 MED ORDER — ENSURE PLUS HIGH PROTEIN PO LIQD
237.0000 mL | Freq: Two times a day (BID) | ORAL | Status: DC
  Administered 2024-01-24: 237 mL via ORAL

## 2024-01-24 MED ORDER — VITAMIN C 500 MG PO TABS
500.0000 mg | ORAL_TABLET | Freq: Two times a day (BID) | ORAL | Status: DC
  Administered 2024-01-24 – 2024-01-26 (×4): 500 mg via ORAL
  Filled 2024-01-24 (×5): qty 1

## 2024-01-24 MED ORDER — DEXTROSE 5 % IV SOLN: INTRAVENOUS | Status: AC

## 2024-01-24 MED ORDER — MIDODRINE HCL 5 MG PO TABS
10.0000 mg | ORAL_TABLET | Freq: Three times a day (TID) | ORAL | Status: DC
  Administered 2024-01-24 – 2024-01-25 (×5): 10 mg via ORAL
  Filled 2024-01-24 (×5): qty 2

## 2024-01-24 MED ORDER — BOOST / RESOURCE BREEZE PO LIQD CUSTOM
1.0000 | Freq: Three times a day (TID) | ORAL | Status: DC
  Administered 2024-01-24: 1 via ORAL

## 2024-01-24 NOTE — Progress Notes (Signed)
 Initial Nutrition Assessment  DOCUMENTATION CODES:   Not applicable  INTERVENTION:   Boost Breeze po TID, each supplement provides 250 kcal and 9 grams of protein  MVI and thiamine  po daily   Folic acid  1mg  po daily x 30 days   Vitamin C  500mg  po BID  Pt at high refeed risk; recommend monitor potassium, magnesium  and phosphorus labs daily until stable  Daily weights   Diet education   NUTRITION DIAGNOSIS:   Inadequate oral intake related to acute illness as evidenced by meal completion < 25%.  GOAL:   Patient will meet greater than or equal to 90% of their needs  MONITOR:   PO intake, Supplement acceptance, Labs, Weight trends, I & O's, Skin  REASON FOR ASSESSMENT:   Consult Assessment of nutrition requirement/status  ASSESSMENT:   63 y/o female with h/o etoh abusem ovarian mass, anxiety, bipolar disorder and hiatal hernia who is admitted with steatohepatitis and new cirrhosis.  Met with pt in room today. Pt reports that she is feeling better. Pt reports good appetite and oral intake at baseline but reports that her oral intake was poor for several days pta. Pt reports eating a few bites for breakfast this morning. Pt did not touch her lunch. Pt reports that she did drink a fair amount of juice today. RD discussed with pt the importance of adequate nutrition needed to preserve lean muscle. Pt does not like Boost or Ensure but is agreeable to try Boost Breeze. Pt is at high refeed risk. Pt with folate deficiency; supplementation is being provided. Per chart, pt appears weight stable at baseline. RD provided pt with basic high protein low sodium diet education today.   Medications reviewed and include: lovenox , folic acid , midodrine , MVI, protonix , KCl, thiamine   Labs reviewed: K 4.2, Mg 1.8 wnl- 12/8 P 2.8 wnl- 12/7 Alk phos 337(H), AST 450(H), ALT 161(H), tbili 3.6(H) Folate- 4.7(L), B12 2602(H)- 12/8 Hgb 10.4(L), Hct 29.6(L), MCV 113.4(H), MCH 39.8(H)- 12/8 Cbgs-  74, 81, 92, 85, 92 x 24 hrs   NUTRITION - FOCUSED PHYSICAL EXAM:  Flowsheet Row Most Recent Value  Orbital Region No depletion  Upper Arm Region Moderate depletion  Thoracic and Lumbar Region No depletion  Buccal Region No depletion  Temple Region Mild depletion  Clavicle Bone Region Mild depletion  Clavicle and Acromion Bone Region Mild depletion  Scapular Bone Region No depletion  Dorsal Hand No depletion  Patellar Region Moderate depletion  Anterior Thigh Region Moderate depletion  Posterior Calf Region Moderate depletion  Edema (RD Assessment) None  Hair Reviewed  Eyes Reviewed  Mouth Reviewed  Skin Reviewed  Nails Reviewed    Diet Order:   Diet Order             Diet regular Room service appropriate? Yes; Fluid consistency: Thin  Diet effective now                  EDUCATION NEEDS:   Education needs have been addressed  Skin:  Skin Assessment: Reviewed RN Assessment (MASD)  Last BM:  12/9- type 5  Height:   Ht Readings from Last 1 Encounters:  01/22/24 5' (1.524 m)    Weight:   Wt Readings from Last 1 Encounters:  01/22/24 51.1 kg   BMI:  Body mass index is 22.01 kg/m.  Estimated Nutritional Needs:   Kcal:  1400-1600kcal/day  Protein:  70-80g/day  Fluid:  1.4-1.6L/day  Augustin Shams MS, RD, LDN If unable to be reached, please send secure chat  to RD inpatient available from 8:00a-4:00p daily

## 2024-01-24 NOTE — Evaluation (Signed)
 Occupational Therapy Evaluation Patient Details Name: Claire Brown MRN: [id number] DOB: [birth year] Today's Date: 01/24/2024   History of Present Illness   Pt admitted for steatohepatitis, hypokalemia, hypotension. PMH significant for bipolar mood disorder, alcohol and tobacco use disorder, & alcoholic ketoacidosis     Clinical Impressions Pt admitted with above. Prior to admission, pt states she lives with her significant other who is available intermittently and relies on neighbor for transportation/groceries. Pt is independent without AD for household mobility & ADL performance. Limited R shoulder ROM (secondary to fracture ~1 year ago non-op) and endorses pain with movement. Pt requires MIN - MOD A for bed mobility, MIN - MOD A for STS transfers bed <> BSC, TOTAL A for standing pericare. Requires multimodal cuing for safety awareness, sequencing and endorses generalized weakness. BP 81/56 with MAP 65. Pt would benefit from skilled OT services to address noted impairments and functional limitations (see below for any additional details) in order to maximize safety and independence while minimizing falls risk and caregiver burden. Anticipate the need for follow up OT services upon acute hospital DC. Patient will benefit from continued inpatient follow up therapy, <3 hours/day      If plan is discharge home, recommend the following:   A lot of help with walking and/or transfers;A lot of help with bathing/dressing/bathroom;Direct supervision/assist for medications management;Direct supervision/assist for financial management;Assist for transportation;Supervision due to cognitive status;Help with stairs or ramp for entrance     Functional Status Assessment   Patient has had a recent decline in their functional status and demonstrates the ability to make significant improvements in function in a reasonable and predictable amount of time.     Equipment Recommendations   BSC/3in1       Precautions/Restrictions   Precautions Precautions: Fall Recall of Precautions/Restrictions: Impaired Precaution/Restrictions Comments: hypotensive Restrictions Weight Bearing Restrictions Per Provider Order: No     Mobility Bed Mobility Overal bed mobility: Needs Assistance Bed Mobility: Supine to Sit, Sit to Supine     Supine to sit: Mod assist Sit to supine: Min assist        Transfers Overall transfer level: Needs assistance Equipment used: Rolling walker (2 wheels), None Transfers: Sit to/from Stand Sit to Stand: Min assist                  Balance Overall balance assessment: Needs assistance Sitting-balance support: Feet supported, No upper extremity supported Sitting balance-Leahy Scale: Good     Standing balance support: Bilateral upper extremity supported Standing balance-Leahy Scale: Poor Standing balance comment: requires RW and +1 for safety/stability with all standing activities                           ADL either performed or assessed with clinical judgement   ADL Overall ADL's : Needs assistance/impaired                         Toilet Transfer: Minimal assistance;Moderate assistance;Cueing for sequencing;Cueing for safety Toilet Transfer Details (indicate cue type and reason): mod A bed > BSC with max cues for sequencing, minA BSC > bed using RW Toileting- Clothing Manipulation and Hygiene: Maximal assistance;Sit to/from stand;Total assistance Toileting - Clothing Manipulation Details (indicate cue type and reason): maxA for standing pericare     Functional mobility during ADLs: Minimal assistance;Cueing for safety;Cueing for sequencing;Rolling walker (2 wheels) General ADL Comments: anticipate up to MIN A for UB ADLs 2/2 decreased R  shoulder ROM, MAX A for LB ADLs     Vision Baseline Vision/History: 4 Cataracts Ability to See in Adequate Light: 1 Impaired Patient Visual Report: No change from  baseline Additional Comments: unable to read clock, can read name badge            Pertinent Vitals/Pain Pain Assessment Pain Assessment: Faces Faces Pain Scale: Hurts little more Pain Location: R shoulder Pain Descriptors / Indicators: Grimacing, Discomfort Pain Intervention(s): Limited activity within patient's tolerance, Monitored during session     Extremity/Trunk Assessment Upper Extremity Assessment Upper Extremity Assessment: Right hand dominant;RUE deficits/detail RUE Deficits / Details: hx of R shoulder fx (non-op), limited shoulder ROM 2/2 pain, limited functional use of RUE for ADLs   Lower Extremity Assessment Lower Extremity Assessment: Generalized weakness   Cervical / Trunk Assessment Cervical / Trunk Assessment: Normal   Communication Communication Communication: No apparent difficulties   Cognition Arousal: Alert Behavior During Therapy: WFL for tasks assessed/performed Cognition: No family/caregiver present to determine baseline, Cognition impaired     Awareness: Intellectual awareness impaired Memory impairment (select all impairments): Short-term memory, Working memory Attention impairment (select first level of impairment): Selective attention Executive functioning impairment (select all impairments): Problem solving, Reasoning, Organization, Initiation, Sequencing OT - Cognition Comments: pt grossly A&Ox4, does demonstrate more confusion with complex questions. increased time to follow commands                 Following commands: Intact       Cueing  General Comments   Cueing Techniques: Verbal cues  BP low (81/56 MAP 65), does not change during positions. RN in room to address skin tear on L forearm.           Home Living Family/patient expects to be discharged to:: Private residence Living Arrangements: Spouse/significant other Available Help at Discharge: Family Type of Home: House Home Access: Stairs to enter Water Quality Scientist of Steps: 2 Entrance Stairs-Rails: None Home Layout: One level     Bathroom Shower/Tub: Chief Strategy Officer: Standard     Home Equipment: None          Prior Functioning/Environment Prior Level of Function : Independent/Modified Independent             Mobility Comments: 1 fall in the past six months, does not use AD for household mobility ADLs Comments: independent. neighbor assists with transportation and groceries.    OT Problem List: Decreased strength;Decreased range of motion;Decreased activity tolerance;Impaired balance (sitting and/or standing);Decreased cognition;Impaired UE functional use   OT Treatment/Interventions: Self-care/ADL training;Therapeutic exercise;Energy conservation;DME and/or AE instruction;Therapeutic activities;Patient/family education;Balance training      OT Goals(Current goals can be found in the care plan section)   Acute Rehab OT Goals OT Goal Formulation: With patient Time For Goal Achievement: 02/07/24 Potential to Achieve Goals: Good   OT Frequency:  Min 2X/week    Co-evaluation              AM-PAC OT 6 Clicks Daily Activity     Outcome Measure Help from another person eating meals?: A Little Help from another person taking care of personal grooming?: A Little Help from another person toileting, which includes using toliet, bedpan, or urinal?: A Lot Help from another person bathing (including washing, rinsing, drying)?: A Lot Help from another person to put on and taking off regular upper body clothing?: A Lot Help from another person to put on and taking off regular lower body clothing?: A Lot 6 Click Score:  14   End of Session Equipment Utilized During Treatment: Gait belt;Rolling walker (2 wheels) Nurse Communication: Mobility status  Activity Tolerance: Patient tolerated treatment well Patient left: in bed;with call bell/phone within reach;with bed alarm set  OT Visit Diagnosis:  Muscle weakness (generalized) (M62.81);Other abnormalities of gait and mobility (R26.89);Unsteadiness on feet (R26.81)                Time: 9059-8986 OT Time Calculation (min): 33 min Charges:  OT General Charges $OT Visit: 1 Visit OT Evaluation $OT Eval Moderate Complexity: 1 Mod  Macallan Ord L. Savvas Roper, OTR/L  01/24/24, 12:37 PM   Delon CROME Anali Cabanilla 01/24/2024, 12:30 PM

## 2024-01-24 NOTE — Progress Notes (Signed)
 PROGRESS NOTE    Claire Brown  FMW:[id number] DOB: 10-08-1960 DOA: 01/22/2024 PCP: Pcp, No   Brief Narrative:     63 y.o. female with medical history significant for Bipolar mood disorder, alcohol and tobacco use disorder, history of admission in 2021 for alcoholic ketoacidosis, being admitted with severe steatohepatitis, AKI, hypoglycemia and electrolyte derangement.  She presented with a 1 week history of vomiting and inability to hold anything down, and difficulty swallowing. Markedly abnormal LFTs with AST/ALT 1290/256, alk phos 712 and total bilirubin 2.7  EKG showed sinus at 82 with low voltage CT soft tissue neck reflecting possible pharyngitis CT abdomen and pelvis: Possible inflammatory or infectious colitis and severe hepatic steatosis, possible gastritis RUQ ultrasound nonspecific gallbladder wall thickening GI consulted and liver enzymes are trending downwards.   Assessment & Plan:  Principal Problem:   Steatohepatitis, severe Active Problems:   Hypokalemia due to excessive gastrointestinal loss of potassium   Hypoglycemia   Hypotension   AKI (acute kidney injury)   Acute gastroenteritis   Hypoalbuminemia   Anemia   Thrombocytopenia   Alcohol use disorder    Steatohepatitis, Acute on chronic, severe Transaminitis, improving Endorses only occasional alcohol use, previous heavy drinker, stopped in May 2025 Presents with vomiting, elevated AST/ALT, alk phos, bilirubin, INR, anemia thrombocytopenia CT abdomen and pelvis showing severe hepatic steatosis, possible colitis, possible gastritis Respiratory panel, hepatitis panel are negative IV hydration IV Protonix , IV antiemetics Dietary consult GI consulted AST 450 (Peaked at 1734), ALT 161 (peaked at 361) today   Hypokalemia due to excessive gastrointestinal loss of potassium Secondary to intractable vomiting and hypovolemia. Prn repletion   Hypotension Continue IV fluid resuscitation.  Received IV  albumin  25 g x 3 doses Midodrine  increased to 10 mg 3 times daily on 12/9.   Hypoglycemia Suspect nutritional/Poor oral intake Similar presentation back in 2017 had a negative insulinoma workup Continue D5 infusion at 50 cc/hr, frequent CBG checks (every 4 hrs) and as needed D50   AKI (acute kidney injury) Resolved   Hypoalbuminemia Suspect nutritional/related to liver disease Dietary consulted   Acute gastroenteritis Gastritis and colitis seen on CT IV Protonix  IV fluids, IV antiemetics and supportive care   Alcohol use disorder: She quit back in May 2025 History of alcohol use disorder EtOH level undetectable CIWA withdrawal protocol   Anemia Thrombocytopenia, mild Hemoglobin 10.3 down from 12.3 and platelets 143 down from 279  Suspect related to alcohol use Continue to monitor  Physical deconditioning,POA: Consulted PT/OT.  Disposition: Home  DVT prophylaxis: enoxaparin  (LOVENOX ) injection 40 mg Start: 01/23/24 0800     Code Status: Full Code Family Communication: None at the bedside  Status is: Inpatient Remains inpatient appropriate because: Hypoglycemia, hypotension, Alcoholic hepatitis.    Subjective:  She feels better. We spoke about her down trending liver enzymes. She told me that appetite is poor.  Examination:  General exam: Appears calm and comfortable  Respiratory system: Clear to auscultation. Respiratory effort normal. Cardiovascular system: S1 & S2 heard, RRR. No JVD, murmurs, rubs, gallops or clicks. No pedal edema. Gastrointestinal system: Abdomen is nondistended, soft and nontender. No organomegaly or masses felt. Normal bowel sounds heard. Central nervous system: Alert and oriented. No focal neurological deficits. Extremities: Symmetric 5 x 5 power. Skin: No rashes, lesions or ulcers      Diet Orders (From admission, onward)     Start     Ordered   01/23/24 1328  DIET SOFT Room service appropriate? Yes; Fluid consistency: Thin  Diet effective now       Question Answer Comment  Room service appropriate? Yes   Fluid consistency: Thin      01/23/24 1327            Objective: Vitals:   01/23/24 1958 01/24/24 0004 01/24/24 0407 01/24/24 0840  BP: 101/77 94/71 93/70  (!) 89/56  Pulse: 91 94 97 96  Resp: 20 20 19 18   Temp: 98 F (36.7 C) 98.1 F (36.7 C) 97.6 F (36.4 C) 97.9 F (36.6 C)  TempSrc:      SpO2: 95% 98% 91% 97%  Weight:      Height:        Intake/Output Summary (Last 24 hours) at 01/24/2024 1007 Last data filed at 01/24/2024 0700 Gross per 24 hour  Intake 2100.28 ml  Output --  Net 2100.28 ml   Filed Weights   01/22/24 1232  Weight: 51.1 kg    Scheduled Meds:  enoxaparin  (LOVENOX ) injection  40 mg Subcutaneous Q24H   folic acid   1 mg Oral Daily   Gerhardt's butt cream   Topical BID   midodrine   5 mg Oral TID WC   multivitamin with minerals  1 tablet Oral Daily   pantoprazole  (PROTONIX ) IV  40 mg Intravenous Q24H   potassium chloride   20 mEq Oral Daily   thiamine   100 mg Oral Daily   Or   thiamine   100 mg Intravenous Daily   Continuous Infusions:  dextrose  50 mL/hr at 01/24/24 0700    Nutritional status     Body mass index is 22.01 kg/m.  Data Reviewed:   CBC: Recent Labs  Lab 01/22/24 1428 01/23/24 0543  WBC 8.0 8.1  NEUTROABS 6.4  --   HGB 10.3* 10.4*  HCT 29.4* 29.6*  MCV 113.1* 113.4*  PLT 143* 106*   Basic Metabolic Panel: Recent Labs  Lab 01/22/24 1428 01/22/24 1917 01/23/24 0900  NA 139  --  136  K 2.5*  --  4.2  CL 94*  --  102  CO2 30  --  22  GLUCOSE 110*  --  115*  BUN 9  --  8  CREATININE 0.93  --  0.72  CALCIUM 7.9*  --  7.1*  MG 2.2  --  1.8  PHOS  --  2.8  --    GFR: Estimated Creatinine Clearance: 52.4 mL/min (by C-G formula based on SCr of 0.72 mg/dL). Liver Function Tests: Recent Labs  Lab 01/22/24 1428 01/23/24 0900 01/24/24 0411  AST 1,290* 1,734* 450*  ALT 256* 361* 161*  ALKPHOS 712* 632* 337*  BILITOT 2.7* 2.7*  3.6*  PROT 4.3* 4.0* 4.0*  ALBUMIN  1.9* 1.6* 2.7*   No results for input(s): LIPASE, AMYLASE in the last 168 hours. No results for input(s): AMMONIA in the last 168 hours. Coagulation Profile: Recent Labs  Lab 01/24/24 0411  INR 3.4*   Cardiac Enzymes: Recent Labs  Lab 01/22/24 1917  CKTOTAL 122   BNP (last 3 results) No results for input(s): PROBNP in the last 8760 hours. HbA1C: No results for input(s): HGBA1C in the last 72 hours. CBG: Recent Labs  Lab 01/24/24 0003 01/24/24 0209 01/24/24 0430 01/24/24 0622 01/24/24 0837  GLUCAP 85 92 85 92 81   Lipid Profile: No results for input(s): CHOL, HDL, LDLCALC, TRIG, CHOLHDL, LDLDIRECT in the last 72 hours. Thyroid  Function Tests: Recent Labs    01/22/24 1428  TSH 1.170  FREET4 0.70   Anemia Panel: Recent Labs  01/23/24 2304  VITAMINB12 2,602*  FOLATE 4.7*   Sepsis Labs: No results for input(s): PROCALCITON, LATICACIDVEN in the last 168 hours.  Recent Results (from the past 240 hours)  Resp panel by RT-PCR (RSV, Flu A&B, Covid) Anterior Nasal Swab     Status: None   Collection Time: 01/22/24  1:06 PM   Specimen: Anterior Nasal Swab  Result Value Ref Range Status   SARS Coronavirus 2 by RT PCR NEGATIVE NEGATIVE Final    Comment: (NOTE) SARS-CoV-2 target nucleic acids are NOT DETECTED.  The SARS-CoV-2 RNA is generally detectable in upper respiratory specimens during the acute phase of infection. The lowest concentration of SARS-CoV-2 viral copies this assay can detect is 138 copies/mL. A negative result does not preclude SARS-Cov-2 infection and should not be used as the sole basis for treatment or other patient management decisions. A negative result may occur with  improper specimen collection/handling, submission of specimen other than nasopharyngeal swab, presence of viral mutation(s) within the areas targeted by this assay, and inadequate number of viral copies(<138  copies/mL). A negative result must be combined with clinical observations, patient history, and epidemiological information. The expected result is Negative.  Fact Sheet for Patients:  bloggercourse.com  Fact Sheet for Healthcare Providers:  seriousbroker.it  This test is no t yet approved or cleared by the [place]  FDA and  has been authorized for detection and/or diagnosis of SARS-CoV-2 by FDA under an Emergency Use Authorization (EUA). This EUA will remain  in effect (meaning this test can be used) for the duration of the COVID-19 declaration under Section 564(b)(1) of the Act, 21 U.S.C.section 360bbb-3(b)(1), unless the authorization is terminated  or revoked sooner.       Influenza A by PCR NEGATIVE NEGATIVE Final   Influenza B by PCR NEGATIVE NEGATIVE Final    Comment: (NOTE) The Xpert Xpress SARS-CoV-2/FLU/RSV plus assay is intended as an aid in the diagnosis of influenza from Nasopharyngeal swab specimens and should not be used as a sole basis for treatment. Nasal washings and aspirates are unacceptable for Xpert Xpress SARS-CoV-2/FLU/RSV testing.  Fact Sheet for Patients: bloggercourse.com  Fact Sheet for Healthcare Providers: seriousbroker.it  This test is not yet approved or cleared by the [place]  FDA and has been authorized for detection and/or diagnosis of SARS-CoV-2 by FDA under an Emergency Use Authorization (EUA). This EUA will remain in effect (meaning this test can be used) for the duration of the COVID-19 declaration under Section 564(b)(1) of the Act, 21 U.S.C. section 360bbb-3(b)(1), unless the authorization is terminated or revoked.     Resp Syncytial Virus by PCR NEGATIVE NEGATIVE Final    Comment: (NOTE) Fact Sheet for Patients: bloggercourse.com  Fact Sheet for Healthcare  Providers: seriousbroker.it  This test is not yet approved or cleared by the [place]  FDA and has been authorized for detection and/or diagnosis of SARS-CoV-2 by FDA under an Emergency Use Authorization (EUA). This EUA will remain in effect (meaning this test can be used) for the duration of the COVID-19 declaration under Section 564(b)(1) of the Act, 21 U.S.C. section 360bbb-3(b)(1), unless the authorization is terminated or revoked.  Performed at [facility], [address] [place], [place] [address]          Radiology Studies: US  ABDOMEN LIMITED RUQ (LIVER/GB) Result Date: 01/22/2024 EXAM: Right Upper Quadrant Abdominal Ultrasound TECHNIQUE: Real-time ultrasonography of the right upper quadrant of the abdomen was performed. COMPARISON: US  Abdomen Limited 07/09/2019. CLINICAL HISTORY: Abdomen pain, elevated liver function  tests. FINDINGS: LIVER: Nodular hepatic contour. Increased hepatic echogenicity. No intrahepatic biliary ductal dilatation. No mass. BILIARY SYSTEM: Gallbladder wall thickening measures 4.2 mm. No evidence of pericholecystic fluid. No cholelithiasis. The common bile duct measures 2.3 mm. RIGHT KIDNEY: The right kidney is grossly unremarkable in appearances without evidence of hydronephrosis, echogenic calculi or worrisome mass lesions. PANCREAS: Visualized portions of the pancreas are unremarkable. OTHER: Small volume simple free fluid ascites. IMPRESSION: 1. Cirrhosis and hepatic steatosis. No focal liver lesions identified. Please note that liver protocol enhanced MR and CT are the most sensitive tests for the screening detection of hepatocellular carcinoma in the high risk setting of cirrhosis. 2. Nonspecific gallbladder wall thickening. This can be seen in the setting of chronic liver disease. 3. Small-volume simple ascites. Electronically signed by: Kate Plummer MD 01/22/2024 07:21 PM EST RP Workstation: [id number]   CT  ABDOMEN PELVIS W CONTRAST Result Date: 01/22/2024 CLINICAL DATA:  Abdominal pain, diarrhea EXAM: CT ABDOMEN AND PELVIS WITH CONTRAST TECHNIQUE: Multidetector CT imaging of the abdomen and pelvis was performed using the standard protocol following bolus administration of intravenous contrast. RADIATION DOSE REDUCTION: This exam was performed according to the departmental dose-optimization program which includes automated exposure control, adjustment of the mA and/or kV according to patient size and/or use of iterative reconstruction technique. CONTRAST:  OMNIPAQUE  IOHEXOL  300 MG/ML  SOLN COMPARISON:  07/12/2023 FINDINGS: Lower chest: Trace bilateral pleural effusions and minimal dependent lower lobe atelectasis. Small hiatal hernia. Hepatobiliary: Marked hepatic steatosis. No focal liver abnormality. Gallbladder is moderately distended, with no evidence of cholelithiasis or cholecystitis. Pancreas: Unremarkable. No pancreatic ductal dilatation or surrounding inflammatory changes. Spleen: Normal in size without focal abnormality. Adrenals/Urinary Tract: Kidneys enhance normally and symmetrically. No urinary tract calculi or obstruction. The adrenals and bladder are unremarkable. Stomach/Bowel: No bowel obstruction or ileus. There is diffuse colonic wall thickening compatible with inflammatory or infectious colitis. Additionally, there is thickening of the gastric rugal folds which could reflect gastritis. Vascular/Lymphatic: Aortic atherosclerosis. No enlarged abdominal or pelvic lymph nodes. Reproductive: Status post hysterectomy. No adnexal masses. Other: Small volume ascites, most pronounced in the lower pelvis. No free intraperitoneal gas. No abdominal wall hernia. Musculoskeletal: Stable postsurgical changes of the left hip. No acute or destructive bony abnormalities. Reconstructed images demonstrate no additional findings. IMPRESSION: 1. Diffuse colonic wall thickening compatible with inflammatory or  infectious colitis. 2. Thickening of the gastric rugal folds which may reflect gastritis. 3. Severe hepatic steatosis. 4. Small volume ascites. 5. Trace bilateral pleural effusions. 6. Small hiatal hernia. 7.  Aortic Atherosclerosis (ICD10-I70.0). Electronically Signed   By: Ozell Daring M.D.   On: 01/22/2024 16:53   CT C-SPINE NO CHARGE Result Date: 01/22/2024 EXAM: CT CERVICAL SPINE WITHOUT CONTRAST 01/22/2024 04:35:27 PM TECHNIQUE: CT of the cervical spine was performed without the administration of intravenous contrast. Multiplanar reformatted images are provided for review. Automated exposure control, iterative reconstruction, and/or weight based adjustment of the mA/kV was utilized to reduce the radiation dose to as low as reasonably achievable. COMPARISON: None available. CLINICAL HISTORY: FINDINGS: CERVICAL SPINE: BONES AND ALIGNMENT: Straightening of the normal cervical lordosis is present. Slight degenerative retrolisthesis is present at C5-C6. No acute fracture or traumatic malalignment. DEGENERATIVE CHANGES: Slight degenerative retrolisthesis is present at C5-C6. SOFT TISSUES: Atherosclerotic changes are present at the carotid bifurcations without significant stenosis. No prevertebral soft tissue swelling. LUNGS: Centrilobular emphysematous changes are noted. IMPRESSION: 1. No acute abnormality of the cervical spine. 2. Straightening of the normal cervical lordosis and slight  degenerative retrolisthesis at C5-C6. 3. Centrilobular emphysematous changes are noted; consider evaluation for eligibility for low-dose CT lung cancer screening program given emphysema as an independent risk factor for lung cancer in appropriate age groups. Electronically signed by: Lonni Necessary MD 01/22/2024 04:51 PM EST RP Workstation: [id number]   CT Soft Tissue Neck W Contrast Result Date: 01/22/2024 EXAM: CT NECK WITH CONTRAST 01/22/2024 04:35:27 PM TECHNIQUE: CT of the neck was performed with the administration  of 100 mL of iohexol  (OMNIPAQUE ) 300 MG/ML solution. Multiplanar reformatted images are provided for review. Automated exposure control, iterative reconstruction, and/or weight based adjustment of the mA/kV was utilized to reduce the radiation dose to as low as reasonably achievable. COMPARISON: None available. CLINICAL HISTORY: Recurrent postprandial emphysema. Right sided dysphagia. Question foreign body. FINDINGS: AERODIGESTIVE TRACT: Mucosal enhancement in the oropharynx may reflect pharyngitis. No discrete mass lesion or foreign body is present. No edema. SALIVARY GLANDS: The parotid and submandibular glands are unremarkable. THYROID : Unremarkable. LYMPH NODES: No suspicious cervical lymphadenopathy. SOFT TISSUES: No mass or fluid collection. BRAIN, ORBITS, SINUSES AND MASTOIDS: No acute abnormality. LUNGS AND MEDIASTINUM: Centrilobular emphysematous changes are present in both lungs. Mild dependent atelectasis is present. Atherosclerotic calcifications are present at the aortic arch and great vessel origins. BONES: No focal bone abnormality. VASCULATURE: Atherosclerotic calcifications are present at the carotid bifurcations bilaterally. No focal stenosis is present. IMPRESSION: 1. Mucosal enhancement in the oropharynx, possibly reflecting pharyngitis. No discrete mass lesion or foreign body identified. 2. Centrilobular emphysema. Consider evaluation for a low-dose CT lung cancer screening program given emphysema as an independent risk factor. 3. Atherosclerotic changes as detailed above, without significant stenosis. Electronically signed by: Lonni Necessary MD 01/22/2024 04:47 PM EST RP Workstation: [id number]   CT HEAD WO CONTRAST ( ) Result Date: 01/22/2024 EXAM: CT HEAD WITHOUT CONTRAST 01/22/2024 04:35:27 PM TECHNIQUE: CT of the head was performed without the administration of intravenous contrast. Automated exposure control, iterative reconstruction, and/or weight based adjustment of the mA/kV  was utilized to reduce the radiation dose to as low as reasonably achievable. COMPARISON: CT head without contrast 07/08/2019. CLINICAL HISTORY: Headache, new onset (Age >= 51y). Fall 1 week ago. FINDINGS: BRAIN AND VENTRICLES: No acute hemorrhage. No evidence of acute infarct. Periventricular white matter decreased attenuation consistent with small vessel ischemic changes. Prominent ventricles, sulci and cisterns consistent with age-related involutional changes. Atrophy and white matter changes are moderately advanced for age with some progression since the prior exam. No hydrocephalus. No extra-axial collection. No mass effect or midline shift. ORBITS: No acute abnormality. SINUSES: No acute abnormality. SOFT TISSUES AND SKULL: No acute soft tissue abnormality. No skull fracture. IMPRESSION: 1. No acute intracranial abnormality. 2. Moderately advanced age-related involutional changes and small vessel ischemic changes with some progression since the prior exam. Electronically signed by: Lonni Necessary MD 01/22/2024 04:40 PM EST RP Workstation: [id number]   DG Chest 2 View Result Date: 01/22/2024 CLINICAL DATA:  Poor oral intake. EXAM: DG CHEST 2V COMPARISON:  05/09/2022. FINDINGS: The heart size and mediastinal contours are within normal limits. No consolidation is seen. There are likely small bilateral pleural effusions with a associated atelectasis or infiltrate on the lateral view. Degenerative changes are present in the thoracic spine. Old rib fractures are present in the ribs on the right. No acute osseous abnormality is seen. IMPRESSION: Small bilateral pleural effusions with atelectasis or infiltrate. Electronically Signed   By: Leita Birmingham M.D.   On: 01/22/2024 16:01  LOS: 2 days   Time spent= 35 mins    Deliliah Room, MD [facility]  If 7PM-7AM, please contact night-coverage  01/24/2024, 10:07 AM

## 2024-01-24 NOTE — Progress Notes (Signed)
 Claire Copping, MD [facility]   [address] [place], [place] [address] Phone: [phone] Fax : [phone]   Subjective: The patient reports that she still feels ill but feels better than yesterday.  Her mentation is much better today and she is able to answer questions more appropriately.  The patient's liver enzymes have dramatically decreased except for her bilirubin which is gone from 2.7 to 3.6 today.  The patient also did not have a baseline INR and the INR was found to be 3.4 which is concerning for decreased synthetic function.   Objective: Vital signs in last 24 hours: Vitals:   01/24/24 0004 01/24/24 0407 01/24/24 0840 01/24/24 1123  BP: 94/71 93/70 (!) 89/56 (!) 84/61  Pulse: 94 97 96 92  Resp: 20 19 18 16   Temp: 98.1 F (36.7 C) 97.6 F (36.4 C) 97.9 F (36.6 C) 98 F (36.7 C)  TempSrc:    Oral  SpO2: 98% 91% 97% 95%  Weight:      Height:       Weight change:   Intake/Output Summary (Last 24 hours) at 01/24/2024 1457 Last data filed at 01/24/2024 0900 Gross per 24 hour  Intake 723.66 ml  Output --  Net 723.66 ml     Exam: Heart:: Regular rate and rhythm or without murmur or extra heart sounds Lungs: normal and clear to auscultation and percussion Abdomen: soft, nontender, normal bowel sounds   Lab Results: @LABTEST2 @ Micro Results: Recent Results (from the past 240 hours)  Resp panel by RT-PCR (RSV, Flu A&B, Covid) Anterior Nasal Swab     Status: None   Collection Time: 01/22/24  1:06 PM   Specimen: Anterior Nasal Swab  Result Value Ref Range Status   SARS Coronavirus 2 by RT PCR NEGATIVE NEGATIVE Final    Comment: (NOTE) SARS-CoV-2 target nucleic acids are NOT DETECTED.  The SARS-CoV-2 RNA is generally detectable in upper respiratory specimens during the acute phase of infection. The lowest concentration of SARS-CoV-2 viral copies this assay can detect is 138 copies/mL. A negative result does not preclude SARS-Cov-2 infection and  should not be used as the sole basis for treatment or other patient management decisions. A negative result may occur with  improper specimen collection/handling, submission of specimen other than nasopharyngeal swab, presence of viral mutation(s) within the areas targeted by this assay, and inadequate number of viral copies(<138 copies/mL). A negative result must be combined with clinical observations, patient history, and epidemiological information. The expected result is Negative.  Fact Sheet for Patients:  bloggercourse.com  Fact Sheet for Healthcare Providers:  seriousbroker.it  This test is no t yet approved or cleared by the [place]  FDA and  has been authorized for detection and/or diagnosis of SARS-CoV-2 by FDA under an Emergency Use Authorization (EUA). This EUA will remain  in effect (meaning this test can be used) for the duration of the COVID-19 declaration under Section 564(b)(1) of the Act, 21 U.S.C.section 360bbb-3(b)(1), unless the authorization is terminated  or revoked sooner.       Influenza A by PCR NEGATIVE NEGATIVE Final   Influenza B by PCR NEGATIVE NEGATIVE Final    Comment: (NOTE) The Xpert Xpress SARS-CoV-2/FLU/RSV plus assay is intended as an aid in the diagnosis of influenza from Nasopharyngeal swab specimens and should not be used as a sole basis for treatment. Nasal washings and aspirates are unacceptable for Xpert Xpress SARS-CoV-2/FLU/RSV testing.  Fact Sheet for Patients: bloggercourse.com  Fact Sheet for Healthcare Providers: seriousbroker.it  This test is not yet approved or cleared by the [place]  FDA and has been authorized for detection and/or diagnosis of SARS-CoV-2 by FDA under an Emergency Use Authorization (EUA). This EUA will remain in effect (meaning this test can be used) for the duration of the COVID-19 declaration  under Section 564(b)(1) of the Act, 21 U.S.C. section 360bbb-3(b)(1), unless the authorization is terminated or revoked.     Resp Syncytial Virus by PCR NEGATIVE NEGATIVE Final    Comment: (NOTE) Fact Sheet for Patients: bloggercourse.com  Fact Sheet for Healthcare Providers: seriousbroker.it  This test is not yet approved or cleared by the [place]  FDA and has been authorized for detection and/or diagnosis of SARS-CoV-2 by FDA under an Emergency Use Authorization (EUA). This EUA will remain in effect (meaning this test can be used) for the duration of the COVID-19 declaration under Section 564(b)(1) of the Act, 21 U.S.C. section 360bbb-3(b)(1), unless the authorization is terminated or revoked.  Performed at [facility], [address] [place], [place] [address]    Studies/Results: US  ABDOMEN LIMITED RUQ (LIVER/GB) Result Date: 01/22/2024 EXAM: Right Upper Quadrant Abdominal Ultrasound TECHNIQUE: Real-time ultrasonography of the right upper quadrant of the abdomen was performed. COMPARISON: US  Abdomen Limited 07/09/2019. CLINICAL HISTORY: Abdomen pain, elevated liver function tests. FINDINGS: LIVER: Nodular hepatic contour. Increased hepatic echogenicity. No intrahepatic biliary ductal dilatation. No mass. BILIARY SYSTEM: Gallbladder wall thickening measures 4.2 mm. No evidence of pericholecystic fluid. No cholelithiasis. The common bile duct measures 2.3 mm. RIGHT KIDNEY: The right kidney is grossly unremarkable in appearances without evidence of hydronephrosis, echogenic calculi or worrisome mass lesions. PANCREAS: Visualized portions of the pancreas are unremarkable. OTHER: Small volume simple free fluid ascites. IMPRESSION: 1. Cirrhosis and hepatic steatosis. No focal liver lesions identified. Please note that liver protocol enhanced MR and CT are the most sensitive tests for the screening detection of hepatocellular  carcinoma in the high risk setting of cirrhosis. 2. Nonspecific gallbladder wall thickening. This can be seen in the setting of chronic liver disease. 3. Small-volume simple ascites. Electronically signed by: Kate Plummer MD 01/22/2024 07:21 PM EST RP Workstation: [id number]   CT ABDOMEN PELVIS W CONTRAST Result Date: 01/22/2024 CLINICAL DATA:  Abdominal pain, diarrhea EXAM: CT ABDOMEN AND PELVIS WITH CONTRAST TECHNIQUE: Multidetector CT imaging of the abdomen and pelvis was performed using the standard protocol following bolus administration of intravenous contrast. RADIATION DOSE REDUCTION: This exam was performed according to the departmental dose-optimization program which includes automated exposure control, adjustment of the mA and/or kV according to patient size and/or use of iterative reconstruction technique. CONTRAST:  OMNIPAQUE  IOHEXOL  300 MG/ML  SOLN COMPARISON:  07/12/2023 FINDINGS: Lower chest: Trace bilateral pleural effusions and minimal dependent lower lobe atelectasis. Small hiatal hernia. Hepatobiliary: Marked hepatic steatosis. No focal liver abnormality. Gallbladder is moderately distended, with no evidence of cholelithiasis or cholecystitis. Pancreas: Unremarkable. No pancreatic ductal dilatation or surrounding inflammatory changes. Spleen: Normal in size without focal abnormality. Adrenals/Urinary Tract: Kidneys enhance normally and symmetrically. No urinary tract calculi or obstruction. The adrenals and bladder are unremarkable. Stomach/Bowel: No bowel obstruction or ileus. There is diffuse colonic wall thickening compatible with inflammatory or infectious colitis. Additionally, there is thickening of the gastric rugal folds which could reflect gastritis. Vascular/Lymphatic: Aortic atherosclerosis. No enlarged abdominal or pelvic lymph nodes. Reproductive: Status post hysterectomy. No adnexal masses. Other: Small volume ascites, most pronounced in the lower pelvis. No free  intraperitoneal gas. No abdominal wall hernia. Musculoskeletal: Stable postsurgical changes of  the left hip. No acute or destructive bony abnormalities. Reconstructed images demonstrate no additional findings. IMPRESSION: 1. Diffuse colonic wall thickening compatible with inflammatory or infectious colitis. 2. Thickening of the gastric rugal folds which may reflect gastritis. 3. Severe hepatic steatosis. 4. Small volume ascites. 5. Trace bilateral pleural effusions. 6. Small hiatal hernia. 7.  Aortic Atherosclerosis (ICD10-I70.0). Electronically Signed   By: Ozell Daring M.D.   On: 01/22/2024 16:53   CT C-SPINE NO CHARGE Result Date: 01/22/2024 EXAM: CT CERVICAL SPINE WITHOUT CONTRAST 01/22/2024 04:35:27 PM TECHNIQUE: CT of the cervical spine was performed without the administration of intravenous contrast. Multiplanar reformatted images are provided for review. Automated exposure control, iterative reconstruction, and/or weight based adjustment of the mA/kV was utilized to reduce the radiation dose to as low as reasonably achievable. COMPARISON: None available. CLINICAL HISTORY: FINDINGS: CERVICAL SPINE: BONES AND ALIGNMENT: Straightening of the normal cervical lordosis is present. Slight degenerative retrolisthesis is present at C5-C6. No acute fracture or traumatic malalignment. DEGENERATIVE CHANGES: Slight degenerative retrolisthesis is present at C5-C6. SOFT TISSUES: Atherosclerotic changes are present at the carotid bifurcations without significant stenosis. No prevertebral soft tissue swelling. LUNGS: Centrilobular emphysematous changes are noted. IMPRESSION: 1. No acute abnormality of the cervical spine. 2. Straightening of the normal cervical lordosis and slight degenerative retrolisthesis at C5-C6. 3. Centrilobular emphysematous changes are noted; consider evaluation for eligibility for low-dose CT lung cancer screening program given emphysema as an independent risk factor for lung cancer in  appropriate age groups. Electronically signed by: Lonni Necessary MD 01/22/2024 04:51 PM EST RP Workstation: [id number]   CT Soft Tissue Neck W Contrast Result Date: 01/22/2024 EXAM: CT NECK WITH CONTRAST 01/22/2024 04:35:27 PM TECHNIQUE: CT of the neck was performed with the administration of 100 mL of iohexol  (OMNIPAQUE ) 300 MG/ML solution. Multiplanar reformatted images are provided for review. Automated exposure control, iterative reconstruction, and/or weight based adjustment of the mA/kV was utilized to reduce the radiation dose to as low as reasonably achievable. COMPARISON: None available. CLINICAL HISTORY: Recurrent postprandial emphysema. Right sided dysphagia. Question foreign body. FINDINGS: AERODIGESTIVE TRACT: Mucosal enhancement in the oropharynx may reflect pharyngitis. No discrete mass lesion or foreign body is present. No edema. SALIVARY GLANDS: The parotid and submandibular glands are unremarkable. THYROID : Unremarkable. LYMPH NODES: No suspicious cervical lymphadenopathy. SOFT TISSUES: No mass or fluid collection. BRAIN, ORBITS, SINUSES AND MASTOIDS: No acute abnormality. LUNGS AND MEDIASTINUM: Centrilobular emphysematous changes are present in both lungs. Mild dependent atelectasis is present. Atherosclerotic calcifications are present at the aortic arch and great vessel origins. BONES: No focal bone abnormality. VASCULATURE: Atherosclerotic calcifications are present at the carotid bifurcations bilaterally. No focal stenosis is present. IMPRESSION: 1. Mucosal enhancement in the oropharynx, possibly reflecting pharyngitis. No discrete mass lesion or foreign body identified. 2. Centrilobular emphysema. Consider evaluation for a low-dose CT lung cancer screening program given emphysema as an independent risk factor. 3. Atherosclerotic changes as detailed above, without significant stenosis. Electronically signed by: Lonni Necessary MD 01/22/2024 04:47 PM EST RP Workstation: [id number]    CT HEAD WO CONTRAST ( ) Result Date: 01/22/2024 EXAM: CT HEAD WITHOUT CONTRAST 01/22/2024 04:35:27 PM TECHNIQUE: CT of the head was performed without the administration of intravenous contrast. Automated exposure control, iterative reconstruction, and/or weight based adjustment of the mA/kV was utilized to reduce the radiation dose to as low as reasonably achievable. COMPARISON: CT head without contrast 07/08/2019. CLINICAL HISTORY: Headache, new onset (Age >= 51y). Fall 1 week ago. FINDINGS: BRAIN AND VENTRICLES: No acute hemorrhage. No evidence  of acute infarct. Periventricular white matter decreased attenuation consistent with small vessel ischemic changes. Prominent ventricles, sulci and cisterns consistent with age-related involutional changes. Atrophy and white matter changes are moderately advanced for age with some progression since the prior exam. No hydrocephalus. No extra-axial collection. No mass effect or midline shift. ORBITS: No acute abnormality. SINUSES: No acute abnormality. SOFT TISSUES AND SKULL: No acute soft tissue abnormality. No skull fracture. IMPRESSION: 1. No acute intracranial abnormality. 2. Moderately advanced age-related involutional changes and small vessel ischemic changes with some progression since the prior exam. Electronically signed by: Lonni Necessary MD 01/22/2024 04:40 PM EST RP Workstation: [id number]   DG Chest 2 View Result Date: 01/22/2024 CLINICAL DATA:  Poor oral intake. EXAM: DG CHEST 2V COMPARISON:  05/09/2022. FINDINGS: The heart size and mediastinal contours are within normal limits. No consolidation is seen. There are likely small bilateral pleural effusions with a associated atelectasis or infiltrate on the lateral view. Degenerative changes are present in the thoracic spine. Old rib fractures are present in the ribs on the right. No acute osseous abnormality is seen. IMPRESSION: Small bilateral pleural effusions with atelectasis or infiltrate.  Electronically Signed   By: Leita Birmingham M.D.   On: 01/22/2024 16:01   Medications: I have reviewed the patient's current medications. Scheduled Meds:  enoxaparin  (LOVENOX ) injection  40 mg Subcutaneous Q24H   feeding supplement  237 mL Oral BID BM   folic acid   1 mg Oral Daily   Gerhardt's butt cream   Topical BID   midodrine   10 mg Oral TID WC   multivitamin with minerals  1 tablet Oral Daily   pantoprazole  (PROTONIX ) IV  40 mg Intravenous Q24H   potassium chloride   20 mEq Oral Daily   thiamine   100 mg Oral Daily   Or   thiamine   100 mg Intravenous Daily   Continuous Infusions: PRN Meds:.dextrose , LORazepam  **OR** LORazepam , prochlorperazine    Assessment: Principal Problem:   Steatohepatitis, severe Active Problems:   Hypokalemia due to excessive gastrointestinal loss of potassium   Hypoglycemia   AKI (acute kidney injury)   Anemia   Thrombocytopenia   Alcohol use disorder   Hypotension   Acute gastroenteritis   Hypoalbuminemia    Plan: This patient came in with a finding of abnormal liver enzymes that have come dramatically down although the patient's bilirubin has gone up and the INR is prolonged.  The patient's mentation is better today than it was yesterday.  She still states that she has no appetite although they are trying to give her some soup.  Of concern is the elevated INR and I will check it again today to see if it is stabilizing.  The patient has been explained the plan and agrees with it.   LOS: 2 days   Claire Copping, MD.FACG 01/24/2024, 2:57 PM Pager [phone] 7am-5pm  Check AMION for 5pm -7am coverage and on weekends

## 2024-01-24 NOTE — Evaluation (Signed)
 Physical Therapy Evaluation Patient Details Name: Claire Brown MRN: [id number] DOB: 10/08/1960 Today's Date: 01/24/2024  History of Present Illness  presented to ER secondary to persistent nausea, vomiting, diarrhea and generalized failure to thrive; admitted for management of steatohepatitis, hypokalemia, hypotension.  Clinical Impression  Patient resting in bed upon arrival to session; alert and oriented to basic information (generally confused to more complex information, recall), follows simple verbal commands and agreeable to modified participation with session.    Patient generally weak and deconditioned throughout all extremities; limited ROM to R shoulder (history of previous ortho injury, non-op) noted.  R hand-dominant.  Does endorse generalized pain (4/10) to R shoulder with movement; does resolve with rest and repositioning. Currently requiring min/mod assist for bed mobility; min/mod assist for sit/stand, standing balance and basic transfers (bed/BSC) with RW.  Does require Consistent cuing for hand placement, transfer sequencing and overall safety awareness.  Patient declines OOB to chair, requesting return to bed end of session. Of note, patient generally hypotensive throughout session-though generally stable with position change (see vitals flowsheet for details).  Does endorse mild dizziness, moderate fatigue with mobility efforts.  Will continue to monitor with progressive mobility efforts in subsequent sessions as appropriate. Would benefit from skilled PT to address above deficits and promote optimal return to PLOF.; recommend post-acute PT follow up as indicated by interdisciplinary care team.          If plan is discharge home, recommend the following: A lot of help with walking and/or transfers;A lot of help with bathing/dressing/bathroom   Can travel by private vehicle   Yes    Equipment Recommendations Rolling walker (2 wheels);BSC/3in1  Recommendations for Other  Services       Functional Status Assessment Patient has had a recent decline in their functional status and demonstrates the ability to make significant improvements in function in a reasonable and predictable amount of time.     Precautions / Restrictions Precautions Precautions: Fall Restrictions Weight Bearing Restrictions Per Provider Order: No      Mobility  Bed Mobility Overal bed mobility: Needs Assistance Bed Mobility: Supine to Sit, Sit to Supine     Supine to sit: Mod assist Sit to supine: Min assist        Transfers Overall transfer level: Needs assistance Equipment used: Rolling walker (2 wheels), None Transfers: Sit to/from Stand             General transfer comment: min/mod assist without assist device; min assist with RW.  Consistent cuing for hand placement, transfer sequencing and overall safety awareness    Ambulation/Gait               General Gait Details: patient declined due to generalized weakness  Stairs            Wheelchair Mobility     Tilt Bed    Modified Rankin (Stroke Patients Only)       Balance Overall balance assessment: Needs assistance Sitting-balance support: Feet supported, No upper extremity supported Sitting balance-Leahy Scale: Good     Standing balance support: Bilateral upper extremity supported Standing balance-Leahy Scale: Poor Standing balance comment: requires RW and +1 for safety/stability with all standing activities                             Pertinent Vitals/Pain Pain Assessment Pain Assessment: No/denies pain    Home Living Family/patient expects to be discharged to:: Private residence Living Arrangements:  Spouse/significant other (boyfriend, works outside of the home) Available Help at Discharge: Family Type of Home: House Home Access: Stairs to enter Entrance Stairs-Rails: None Secretary/administrator of Steps: 2   Home Layout: One level Home Equipment: None       Prior Function               Mobility Comments: Mod indep with ADLs, household mobilization without assist device; does endorse fall history (at least one in previous six months).  Neighbor assists with transportation, errands, community needs       Extremity/Trunk Assessment   Upper Extremity Assessment Upper Extremity Assessment: Generalized weakness (grossly at least 3-/5 throughout; history of R shoulder fracture (non-op))    Lower Extremity Assessment Lower Extremity Assessment: Generalized weakness (grossly at least 3 to 4-/5 throughout)       Communication   Communication Communication: No apparent difficulties    Cognition Arousal: Alert Behavior During Therapy: WFL for tasks assessed/performed   PT - Cognitive impairments: No apparent impairments                       PT - Cognition Comments: mild increase in time for processing Following commands: Intact       Cueing Cueing Techniques: Verbal cues     General Comments      Exercises Other Exercises Other Exercises: Toilet transfer, SPT with/without RW, min/mod assist; min assist for standing balance with RW; dep assist for hygiene   Assessment/Plan    PT Assessment Patient needs continued PT services  PT Problem List Decreased strength;Decreased activity tolerance;Decreased balance;Decreased knowledge of precautions;Decreased mobility;Decreased knowledge of use of DME;Decreased safety awareness       PT Treatment Interventions DME instruction;Gait training;Stair training;Functional mobility training;Therapeutic activities;Therapeutic exercise;Balance training;Patient/family education    PT Goals (Current goals can be found in the Care Plan section)  Acute Rehab PT Goals Patient Stated Goal: to get my strength back, to get warm PT Goal Formulation: With patient Time For Goal Achievement: 02/07/24 Potential to Achieve Goals: Good    Frequency Min 2X/week     Co-evaluation                AM-PAC PT 6 Clicks Mobility  Outcome Measure Help needed turning from your back to your side while in a flat bed without using bedrails?: A Little Help needed moving from lying on your back to sitting on the side of a flat bed without using bedrails?: A Lot Help needed moving to and from a bed to a chair (including a wheelchair)?: A Lot Help needed standing up from a chair using your arms (e.g., wheelchair or bedside chair)?: A Lot Help needed to walk in hospital room?: A Lot Help needed climbing 3-5 steps with a railing? : A Lot 6 Click Score: 13    End of Session Equipment Utilized During Treatment: Gait belt Activity Tolerance: Patient limited by fatigue;Treatment limited secondary to medical complications (Comment) (generalized hypotension) Patient left: in bed;with call bell/phone within reach;with bed alarm set Nurse Communication: Mobility status PT Visit Diagnosis: Muscle weakness (generalized) (M62.81);Difficulty in walking, not elsewhere classified (R26.2);History of falling (Z91.81)    Time: 0940-1010 PT Time Calculation (min) (ACUTE ONLY): 30 min   Charges:   PT Evaluation $PT Eval Moderate Complexity: 1 Mod   PT General Charges $$ ACUTE PT VISIT: 1 Visit         Eden Toohey H. Delores, PT, DPT, NCS 01/24/24, 10:07 AM [phone]

## 2024-01-24 NOTE — Discharge Instructions (Signed)
 Please contact your Medicaid worker for assistance in establishing care with a Primary Care provider

## 2024-01-25 LAB — COMPREHENSIVE METABOLIC PANEL WITH GFR
ALT: 154 U/L — ABNORMAL HIGH (ref 0–44)
AST: 270 U/L — ABNORMAL HIGH (ref 15–41)
Albumin: 2.5 g/dL — ABNORMAL LOW (ref 3.5–5.0)
Alkaline Phosphatase: 329 U/L — ABNORMAL HIGH (ref 38–126)
Anion gap: 10 (ref 5–15)
BUN: 7 mg/dL — ABNORMAL LOW (ref 8–23)
CO2: 25 mmol/L (ref 22–32)
Calcium: 7.8 mg/dL — ABNORMAL LOW (ref 8.9–10.3)
Chloride: 99 mmol/L (ref 98–111)
Creatinine, Ser: 0.49 mg/dL (ref 0.44–1.00)
GFR, Estimated: >60 mL/min (ref >60)
Glucose, Bld: 72 mg/dL (ref 70–99)
Potassium: 3.7 mmol/L (ref 3.5–5.1)
Sodium: 134 mmol/L — ABNORMAL LOW (ref 135–145)
Total Bilirubin: 6.5 mg/dL — ABNORMAL HIGH (ref 0.0–1.2)
Total Protein: 4.1 g/dL — ABNORMAL LOW (ref 6.5–8.1)

## 2024-01-25 LAB — PROTIME-INR
INR: 3 — ABNORMAL HIGH (ref 0.8–1.2)
Prothrombin Time: 32.8 s — ABNORMAL HIGH (ref 11.4–15.2)

## 2024-01-25 LAB — GLUCOSE, CAPILLARY
Glucose-Capillary: 65 mg/dL — ABNORMAL LOW (ref 70–99)
Glucose-Capillary: 72 mg/dL (ref 70–99)
Glucose-Capillary: 77 mg/dL (ref 70–99)
Glucose-Capillary: 81 mg/dL (ref 70–99)
Glucose-Capillary: 82 mg/dL (ref 70–99)
Glucose-Capillary: 83 mg/dL (ref 70–99)
Glucose-Capillary: 89 mg/dL (ref 70–99)

## 2024-01-25 LAB — CERULOPLASMIN: Ceruloplasmin: 5.2 mg/dL — ABNORMAL LOW (ref 19.0–39.0)

## 2024-01-25 LAB — ANA W/REFLEX: Anti Nuclear Antibody (ANA): NEGATIVE

## 2024-01-25 LAB — ANTI-SMOOTH MUSCLE ANTIBODY, IGG: F-Actin IgG: 7 U (ref 0–19)

## 2024-01-25 LAB — PHOSPHORUS: Phosphorus: 1.7 mg/dL — ABNORMAL LOW (ref 2.5–4.6)

## 2024-01-25 LAB — MAGNESIUM: Magnesium: 2.2 mg/dL (ref 1.7–2.4)

## 2024-01-25 LAB — MITOCHONDRIAL ANTIBODIES: Mitochondrial M2 Ab, IgG: <20 U (ref 0.0–20.0)

## 2024-01-25 LAB — EPSTEIN-BARR VIRUS (EBV) ANTIBODY PROFILE
EBV NA IgG: 108 U/mL — ABNORMAL HIGH (ref 0.0–17.9)
EBV VCA IgG: 192 U/mL — ABNORMAL HIGH (ref 0.0–17.9)
EBV VCA IgM: <36 U/mL (ref 0.0–35.9)

## 2024-01-25 LAB — ALPHA-1-ANTITRYPSIN: A-1 Antitrypsin, Ser: 95 mg/dL — ABNORMAL LOW (ref 101–187)

## 2024-01-25 MED ORDER — SODIUM CHLORIDE 0.9 % IV BOLUS
250.0000 mL | Freq: Once | INTRAVENOUS | Status: AC
  Administered 2024-01-25: 250 mL via INTRAVENOUS

## 2024-01-25 MED ORDER — DEXTROSE 5 % IV SOLN: INTRAVENOUS | Status: DC

## 2024-01-25 MED ORDER — PANTOPRAZOLE SODIUM 40 MG PO TBEC
40.0000 mg | DELAYED_RELEASE_TABLET | Freq: Every day | ORAL | Status: DC
  Administered 2024-01-26: 40 mg via ORAL
  Filled 2024-01-25 (×2): qty 1

## 2024-01-25 NOTE — Progress Notes (Addendum)
 PROGRESS NOTE    Claire Brown  FMW:[id number] DOB: [birth year] DOA: 01/22/2024 PCP: Pcp, No   Brief Narrative:     63 y.o. female with medical history significant for Bipolar mood disorder, alcohol and tobacco use disorder, history of admission in 2021 for alcoholic ketoacidosis, being admitted with severe steatohepatitis, AKI, hypoglycemia and electrolyte derangement.  She presented with a 1 week history of vomiting and inability to hold anything down, and difficulty swallowing. Markedly abnormal LFTs with AST/ALT 1290/256, alk phos 712 and total bilirubin 2.7  EKG showed sinus at 82 with low voltage CT soft tissue neck reflecting possible pharyngitis CT abdomen and pelvis: Possible inflammatory or infectious colitis and severe hepatic steatosis, possible gastritis RUQ ultrasound nonspecific gallbladder wall thickening GI consulted and liver enzymes are trending downwards.   Assessment & Plan:  Principal Problem:   Steatohepatitis, severe Active Problems:   Hypokalemia due to excessive gastrointestinal loss of potassium   Hypoglycemia   Hypotension   AKI (acute kidney injury)   Acute gastroenteritis   Hypoalbuminemia   Anemia   Thrombocytopenia   Alcohol use disorder    Steatohepatitis, Acute on chronic, severe Transaminitis, improving Endorses only occasional alcohol use, previous heavy drinker, stopped in May 2025 Presents with vomiting, elevated AST/ALT, alk phos, bilirubin, INR, anemia thrombocytopenia CT abdomen and pelvis showing severe hepatic steatosis, possible colitis, possible gastritis Respiratory panel, hepatitis panel are negative IV hydration IV Protonix , IV antiemetics Dietary consulted GI consulted AST 270 (Peaked at 1734), ALT 154 (peaked at 361) today   Hypokalemia due to excessive gastrointestinal loss of potassium Secondary to intractable vomiting and hypovolemia. Prn repletion   Hypotension Continue IV fluid resuscitation with D5.  Received  IV albumin  25 g x 3 doses Midodrine  increased to 10 mg 3 times daily on 12/9.   Hypoglycemia Suspect nutritional/Poor oral intake Similar presentation back in 2017 had a negative insulinoma workup Continue D5 infusion at 50 cc/hr, frequent CBG checks (every 4 hrs) and as needed D50   AKI (acute kidney injury) Resolved   Hypoalbuminemia Suspect nutritional/related to liver disease Dietary consulted   Acute gastroenteritis Gastritis and colitis seen on CT IV Protonix  IV fluids, IV antiemetics and supportive care   Alcohol use disorder: She quit back in May 2025 History of alcohol use disorder EtOH level undetectable CIWA withdrawal protocol   Anemia Thrombocytopenia, mild related to alcohol use Continue to monitor  Physical deconditioning,POA: Consulted PT/OT.  Disposition: Lives at home. Needs SNF at discharge.  DVT prophylaxis: enoxaparin  (LOVENOX ) injection 40 mg Start: 01/23/24 0800     Code Status: Full Code Family Communication: None at the bedside  Status is: Inpatient Remains inpatient appropriate because: Hypoglycemia, hypotension, Alcoholic hepatitis.    Subjective:  She feels tired and weak and has not much of an appetite. She lives at home by herself. We spoke about the importance of eating better so we can get her off of the dextrose  drip.  Examination:  General exam: Appears calm and comfortable  Respiratory system: Clear to auscultation. Respiratory effort normal. Cardiovascular system: S1 & S2 heard, RRR. No JVD, murmurs, rubs, gallops or clicks. No pedal edema. Gastrointestinal system: Abdomen is nondistended, soft and nontender. No organomegaly or masses felt. Normal bowel sounds heard. Central nervous system: Alert and oriented. No focal neurological deficits. Extremities: Symmetric 5 x 5 power. Skin: No rashes, lesions or ulcers      Diet Orders (From admission, onward)     Start     Ordered  01/24/24 1640  Diet regular Room service  appropriate? Yes; Fluid consistency: Thin  Diet effective now       Question Answer Comment  Room service appropriate? Yes   Fluid consistency: Thin      01/24/24 1641            Objective: Vitals:   01/25/24 0028 01/25/24 0448 01/25/24 0500 01/25/24 0815  BP: 98/60 103/66  95/64  Pulse: 81 87  88  Resp: 20 20  16   Temp: 99 F (37.2 C) 98.4 F (36.9 C)  98.8 F (37.1 C)  TempSrc:      SpO2: 90% 96%  92%  Weight:   57.5 kg   Height:        Intake/Output Summary (Last 24 hours) at 01/25/2024 1002 Last data filed at 01/24/2024 1600 Gross per 24 hour  Intake 184.76 ml  Output --  Net 184.76 ml   Filed Weights   01/22/24 1232 01/24/24 1650 01/25/24 0500  Weight: 51.1 kg 57.9 kg 57.5 kg    Scheduled Meds:  vitamin C   500 mg Oral BID   enoxaparin  (LOVENOX ) injection  40 mg Subcutaneous Q24H   feeding supplement  1 Container Oral TID BM   folic acid   1 mg Oral Daily   Gerhardt's butt cream   Topical BID   midodrine   10 mg Oral TID WC   multivitamin with minerals  1 tablet Oral Daily   pantoprazole   40 mg Oral QHS   potassium chloride   20 mEq Oral Daily   thiamine   100 mg Oral Daily   Or   thiamine   100 mg Intravenous Daily   Continuous Infusions:  dextrose  50 mL/hr at 01/24/24 1726    Nutritional status Signs/Symptoms: meal completion < 25%   Body mass index is 24.76 kg/m.  Data Reviewed:   CBC: Recent Labs  Lab 01/22/24 1428 01/23/24 0543  WBC 8.0 8.1  NEUTROABS 6.4  --   HGB 10.3* 10.4*  HCT 29.4* 29.6*  MCV 113.1* 113.4*  PLT 143* 106*   Basic Metabolic Panel: Recent Labs  Lab 01/22/24 1428 01/22/24 1917 01/23/24 0900 01/25/24 0520  NA 139  --  136 134*  K 2.5*  --  4.2 3.7  CL 94*  --  102 99  CO2 30  --  22 25  GLUCOSE 110*  --  115* 72  BUN 9  --  8 7*  CREATININE 0.93  --  0.72 0.49  CALCIUM 7.9*  --  7.1* 7.8*  MG 2.2  --  1.8 2.2  PHOS  --  2.8  --  1.7*   GFR: Estimated Creatinine Clearance: 57.9 mL/min (by C-G formula  based on SCr of 0.49 mg/dL). Liver Function Tests: Recent Labs  Lab 01/22/24 1428 01/23/24 0900 01/24/24 0411 01/25/24 0520  AST 1,290* 1,734* 450* 270*  ALT 256* 361* 161* 154*  ALKPHOS 712* 632* 337* 329*  BILITOT 2.7* 2.7* 3.6* 6.5*  PROT 4.3* 4.0* 4.0* 4.1*  ALBUMIN  1.9* 1.6* 2.7* 2.5*   No results for input(s): LIPASE, AMYLASE in the last 168 hours. No results for input(s): AMMONIA in the last 168 hours. Coagulation Profile: Recent Labs  Lab 01/24/24 0411 01/25/24 0013  INR 3.4* 3.0*   Cardiac Enzymes: Recent Labs  Lab 01/22/24 1917  CKTOTAL 122   BNP (last 3 results) No results for input(s): PROBNP in the last 8760 hours. HbA1C: No results for input(s): HGBA1C in the last 72 hours. CBG: Recent Labs  Lab 01/24/24 1856 01/24/24 2002 01/25/24 0029 01/25/24 0455 01/25/24 0814  GLUCAP 84 85 89 83 82   Lipid Profile: No results for input(s): CHOL, HDL, LDLCALC, TRIG, CHOLHDL, LDLDIRECT in the last 72 hours. Thyroid  Function Tests: Recent Labs    01/22/24 1428  TSH 1.170  FREET4 0.70   Anemia Panel: Recent Labs    01/23/24 2304  VITAMINB12 2,602*  FOLATE 4.7*   Sepsis Labs: No results for input(s): PROCALCITON, LATICACIDVEN in the last 168 hours.  Recent Results (from the past 240 hours)  Resp panel by RT-PCR (RSV, Flu A&B, Covid) Anterior Nasal Swab     Status: None   Collection Time: 01/22/24  1:06 PM   Specimen: Anterior Nasal Swab  Result Value Ref Range Status   SARS Coronavirus 2 by RT PCR NEGATIVE NEGATIVE Final    Comment: (NOTE) SARS-CoV-2 target nucleic acids are NOT DETECTED.  The SARS-CoV-2 RNA is generally detectable in upper respiratory specimens during the acute phase of infection. The lowest concentration of SARS-CoV-2 viral copies this assay can detect is 138 copies/mL. A negative result does not preclude SARS-Cov-2 infection and should not be used as the sole basis for treatment or other patient  management decisions. A negative result may occur with  improper specimen collection/handling, submission of specimen other than nasopharyngeal swab, presence of viral mutation(s) within the areas targeted by this assay, and inadequate number of viral copies(<138 copies/mL). A negative result must be combined with clinical observations, patient history, and epidemiological information. The expected result is Negative.  Fact Sheet for Patients:  bloggercourse.com  Fact Sheet for Healthcare Providers:  seriousbroker.it  This test is no t yet approved or cleared by the [place]  FDA and  has been authorized for detection and/or diagnosis of SARS-CoV-2 by FDA under an Emergency Use Authorization (EUA). This EUA will remain  in effect (meaning this test can be used) for the duration of the COVID-19 declaration under Section 564(b)(1) of the Act, 21 U.S.C.section 360bbb-3(b)(1), unless the authorization is terminated  or revoked sooner.       Influenza A by PCR NEGATIVE NEGATIVE Final   Influenza B by PCR NEGATIVE NEGATIVE Final    Comment: (NOTE) The Xpert Xpress SARS-CoV-2/FLU/RSV plus assay is intended as an aid in the diagnosis of influenza from Nasopharyngeal swab specimens and should not be used as a sole basis for treatment. Nasal washings and aspirates are unacceptable for Xpert Xpress SARS-CoV-2/FLU/RSV testing.  Fact Sheet for Patients: bloggercourse.com  Fact Sheet for Healthcare Providers: seriousbroker.it  This test is not yet approved or cleared by the [place]  FDA and has been authorized for detection and/or diagnosis of SARS-CoV-2 by FDA under an Emergency Use Authorization (EUA). This EUA will remain in effect (meaning this test can be used) for the duration of the COVID-19 declaration under Section 564(b)(1) of the Act, 21 U.S.C. section 360bbb-3(b)(1),  unless the authorization is terminated or revoked.     Resp Syncytial Virus by PCR NEGATIVE NEGATIVE Final    Comment: (NOTE) Fact Sheet for Patients: bloggercourse.com  Fact Sheet for Healthcare Providers: seriousbroker.it  This test is not yet approved or cleared by the [place]  FDA and has been authorized for detection and/or diagnosis of SARS-CoV-2 by FDA under an Emergency Use Authorization (EUA). This EUA will remain in effect (meaning this test can be used) for the duration of the COVID-19 declaration under Section 564(b)(1) of the Act, 21 U.S.C. section 360bbb-3(b)(1), unless the authorization is terminated or revoked.  Performed at [facility], [address], [place], [place] [address]          Radiology Studies: No results found.          LOS: 3 days   Time spent= 35 mins    Deliliah Room, MD [facility]  If 7PM-7AM, please contact night-coverage  01/25/2024, 10:02 AM

## 2024-01-25 NOTE — Progress Notes (Addendum)
 Claire Copping, MD [facility]   [address], [address] [place], [place] [address] Phone: [phone] Fax : [phone]   Subjective: The patient was seen this morning and appears more confused today than she was yesterday.  I am not sure if this is due to her bipolar disorder or something else.  The patient has improving INR meaning that the synthetic function of her liver is returning although it is unlikely to return to baseline since the patient has known cirrhosis.  The patient's liver enzymes are also improving except for the bilirubin which is going up which is quite commonly the last thing to turn around with liver injury. The patient ceruloplasmin and alpha 1 antitrypsin were both low but this can be seen in advanced liver disease.   Objective: Vital signs in last 24 hours: Vitals:   01/25/24 0448 01/25/24 0500 01/25/24 0815 01/25/24 1126  BP: 103/66  95/64 99/68  Pulse: 87  88 76  Resp: 20  16   Temp: 98.4 F (36.9 C)  98.8 F (37.1 C)   TempSrc:      SpO2: 96%  92%   Weight:  57.5 kg    Height:       Weight change:   Intake/Output Summary (Last 24 hours) at 01/25/2024 1259 Last data filed at 01/24/2024 1600 Gross per 24 hour  Intake 184.76 ml  Output --  Net 184.76 ml     Exam: Heart:: Regular rate and rhythm or without murmur or extra heart sounds Lungs: normal and clear to auscultation and percussion Abdomen: soft, nontender, normal bowel sounds   Lab Results: @LABTEST2 @ Micro Results: Recent Results (from the past 240 hours)  Resp panel by RT-PCR (RSV, Flu A&B, Covid) Anterior Nasal Swab     Status: None   Collection Time: 01/22/24  1:06 PM   Specimen: Anterior Nasal Swab  Result Value Ref Range Status   SARS Coronavirus 2 by RT PCR NEGATIVE NEGATIVE Final    Comment: (NOTE) SARS-CoV-2 target nucleic acids are NOT DETECTED.  The SARS-CoV-2 RNA is generally detectable in upper respiratory specimens during the acute phase of infection. The  lowest concentration of SARS-CoV-2 viral copies this assay can detect is 138 copies/mL. A negative result does not preclude SARS-Cov-2 infection and should not be used as the sole basis for treatment or other patient management decisions. A negative result may occur with  improper specimen collection/handling, submission of specimen other than nasopharyngeal swab, presence of viral mutation(s) within the areas targeted by this assay, and inadequate number of viral copies(<138 copies/mL). A negative result must be combined with clinical observations, patient history, and epidemiological information. The expected result is Negative.  Fact Sheet for Patients:  bloggercourse.com  Fact Sheet for Healthcare Providers:  seriousbroker.it  This test is no t yet approved or cleared by the [place]  FDA and  has been authorized for detection and/or diagnosis of SARS-CoV-2 by FDA under an Emergency Use Authorization (EUA). This EUA will remain  in effect (meaning this test can be used) for the duration of the COVID-19 declaration under Section 564(b)(1) of the Act, 21 U.S.C.section 360bbb-3(b)(1), unless the authorization is terminated  or revoked sooner.       Influenza A by PCR NEGATIVE NEGATIVE Final   Influenza B by PCR NEGATIVE NEGATIVE Final    Comment: (NOTE) The Xpert Xpress SARS-CoV-2/FLU/RSV plus assay is intended as an aid in the diagnosis of influenza from Nasopharyngeal swab specimens and should not be used as a  sole basis for treatment. Nasal washings and aspirates are unacceptable for Xpert Xpress SARS-CoV-2/FLU/RSV testing.  Fact Sheet for Patients: bloggercourse.com  Fact Sheet for Healthcare Providers: seriousbroker.it  This test is not yet approved or cleared by the [place]  FDA and has been authorized for detection and/or diagnosis of SARS-CoV-2 by FDA under  an Emergency Use Authorization (EUA). This EUA will remain in effect (meaning this test can be used) for the duration of the COVID-19 declaration under Section 564(b)(1) of the Act, 21 U.S.C. section 360bbb-3(b)(1), unless the authorization is terminated or revoked.     Resp Syncytial Virus by PCR NEGATIVE NEGATIVE Final    Comment: (NOTE) Fact Sheet for Patients: bloggercourse.com  Fact Sheet for Healthcare Providers: seriousbroker.it  This test is not yet approved or cleared by the [place]  FDA and has been authorized for detection and/or diagnosis of SARS-CoV-2 by FDA under an Emergency Use Authorization (EUA). This EUA will remain in effect (meaning this test can be used) for the duration of the COVID-19 declaration under Section 564(b)(1) of the Act, 21 U.S.C. section 360bbb-3(b)(1), unless the authorization is terminated or revoked.  Performed at [facility], [address], [place], [place] [address]    Studies/Results: No results found. Medications: I have reviewed the patient's current medications. Scheduled Meds:  vitamin C   500 mg Oral BID   enoxaparin  (LOVENOX ) injection  40 mg Subcutaneous Q24H   feeding supplement  1 Container Oral TID BM   folic acid   1 mg Oral Daily   Gerhardt's butt cream   Topical BID   midodrine   10 mg Oral TID WC   multivitamin with minerals  1 tablet Oral Daily   pantoprazole   40 mg Oral QHS   potassium chloride   20 mEq Oral Daily   thiamine   100 mg Oral Daily   Or   thiamine   100 mg Intravenous Daily   Continuous Infusions:  dextrose  50 mL/hr at 01/24/24 1726   PRN Meds:.dextrose , LORazepam  **OR** LORazepam , prochlorperazine    Assessment: Principal Problem:   Steatohepatitis, severe Active Problems:   Hypokalemia due to excessive gastrointestinal loss of potassium   Hypoglycemia   AKI (acute kidney injury)   Anemia   Thrombocytopenia   Alcohol use  disorder   Hypotension   Acute gastroenteritis   Hypoalbuminemia    Plan: This patient has improving liver enzymes except for bili which is gone up which is usually the last thing to improve.  The patient's mentation is little down today and the patient's folate is low.  The patient has been given folate and thiamine  while the thiamine  levels are still pending for the last few days.  Overall biochemically the patient's liver is improving   LOS: 3 days   Claire Copping, MD.FACG 01/25/2024, 12:59 PM Pager [phone] 7am-5pm  Check AMION for 5pm -7am coverage and on weekends

## 2024-01-25 NOTE — NC FL2 (Signed)
 [place]  MEDICAID FL2 LEVEL OF CARE FORM     IDENTIFICATION  Patient Name: Claire Brown Birthdate: 26-Aug-1960 Sex: female Admission Date (Current Location): 01/22/2024  [facility] and [place] Number:  Chiropodist and Address:         Provider Number: [id number]  Attending Physician Name and Address:  Dino Antu, MD  Relative Name and Phone Number:       Current Level of Care: Hospital Recommended Level of Care: Skilled Nursing Facility Prior Approval Number:    Date Approved/Denied:   PASRR Number: [id number]  Discharge Plan: SNF    Current Diagnoses: Patient Active Problem List   Diagnosis Date Noted   Alcohol use disorder 01/23/2024   Hypotension 01/23/2024   Acute gastroenteritis 01/23/2024   Hypoalbuminemia 01/23/2024   Hypokalemia due to excessive gastrointestinal loss of potassium 01/22/2024   Hypoglycemia 01/22/2024   AKI (acute kidney injury) 01/22/2024   Anemia 01/22/2024   Thrombocytopenia 01/22/2024   Tobacco abuse 07/13/2023   Hypomagnesemia 07/13/2023   Nausea vomiting and diarrhea 07/12/2023   Hypokalemia 07/12/2023   UTI (urinary tract infection) 07/09/2019   Intractable headache 07/09/2019   Steatohepatitis, severe 07/08/2019   Alcoholic ketoacidosis 07/08/2019   Metabolic acidosis 07/08/2019   Shock (HCC) 03/29/2016   S/P laparoscopic hysterectomy 10/14/2015   Ovarian mass, right 09/24/2015    Orientation RESPIRATION BLADDER Height & Weight     Self, Time, Situation, Place  Normal Incontinent Weight: 57.5 kg Height:  5' (152.4 cm)  BEHAVIORAL SYMPTOMS/MOOD NEUROLOGICAL BOWEL NUTRITION STATUS      Incontinent Diet  AMBULATORY STATUS COMMUNICATION OF NEEDS Skin   Extensive Assist Verbally Other (Comment) (Redness Mid Vagina)                       Personal Care Assistance Level of Assistance  Dressing     Dressing Assistance: Limited assistance     Functional Limitations Info             SPECIAL CARE  FACTORS FREQUENCY  PT (By licensed PT), OT (By licensed OT)     PT Frequency: 3X/Week OT Frequency: 3X/Week            Contractures Contractures Info: Not present    Additional Factors Info  Code Status Code Status Info: Full             Current Medications (01/25/2024):  This is the current hospital active medication list Current Facility-Administered Medications  Medication Dose Route Frequency Provider Last Rate Last Admin   ascorbic acid  (VITAMIN C ) tablet 500 mg  500 mg Oral BID Rashid, Farhan, MD   500 mg at 01/25/24 9153   dextrose  5 % solution   Intravenous Continuous Dino Antu, MD 50 mL/hr at 01/24/24 1726 New Bag at 01/24/24 1726   dextrose  50 % solution 50 mL  1 ampule Intravenous PRN Duncan, Hazel V, MD       enoxaparin  (LOVENOX ) injection 40 mg  40 mg Subcutaneous Q24H Duncan, Hazel V, MD   40 mg at 01/25/24 0847   feeding supplement (BOOST / RESOURCE BREEZE) liquid 1 Container  1 Container Oral TID BM Rashid, Farhan, MD   1 Container at 01/24/24 2127   folic acid  (FOLVITE ) tablet 1 mg  1 mg Oral Daily Duncan, Hazel V, MD   1 mg at 01/25/24 9153   Gerhardt's butt cream   Topical BID Sigdel, Santosh, MD   Given at 01/25/24 0848   LORazepam  (ATIVAN ) tablet  1-4 mg  1-4 mg Oral Q1H PRN Duncan, Hazel V, MD       Or   LORazepam  (ATIVAN ) injection 1-4 mg  1-4 mg Intravenous Q1H PRN Duncan, Hazel V, MD       midodrine  (PROAMATINE ) tablet 10 mg  10 mg Oral TID WC Rashid, Farhan, MD   10 mg at 01/25/24 1142   multivitamin with minerals tablet 1 tablet  1 tablet Oral Daily Duncan, Hazel V, MD   1 tablet at 01/24/24 1027   pantoprazole  (PROTONIX ) EC tablet 40 mg  40 mg Oral QHS Chappell, Alex B, RPH       potassium chloride  (KLOR-CON ) packet 20 mEq  20 mEq Oral Daily Patel, Kishan S, RPH   20 mEq at 01/25/24 9152   prochlorperazine  (COMPAZINE ) injection 10 mg  10 mg Intravenous Q4H PRN Cleatus Delayne GAILS, MD   10 mg at 01/23/24 0913   thiamine  (VITAMIN B1) tablet 100 mg   100 mg Oral Daily Duncan, Hazel V, MD   100 mg at 01/25/24 9153   Or   thiamine  (VITAMIN B1) injection 100 mg  100 mg Intravenous Daily Duncan, Hazel V, MD         Discharge Medications: Please see discharge summary for a list of discharge medications.  Relevant Imaging Results:  Relevant Lab Results:   Additional Information: SS# [id number]    Victory Jackquline RAMAN, RN

## 2024-01-25 NOTE — TOC Initial Note (Signed)
 Transition of Care [facility]) - Initial/Assessment Note    Patient Details  Name: Claire Brown MRN: [id number] Date of Birth: 02-11-1961  Transition of Care [facility]) CM/SW Contact:    Victory Jackquline RAMAN, RN Phone Number: 01/25/2024, 12:28 PM  Clinical Narrative:      10:30 am: [facility] met with patient at bedside. RNCM Introduced role and explained that discharge planning would be discussed. PT is recommending STR. Patient is in agreement with STR and asked that I submit referrals for her secondary to her not being familiar with the SNF'S in the area. States she lives with her boyfriend and that he will be picking her up at the time of discharge. FL2 completed, PASRR# [id number] A and Referral's submitted for bed offers.RNCM will continue to follow for discharge planning needs.            Expected Discharge Plan: Skilled Nursing Facility Barriers to Discharge: Continued Medical Work up   Patient Goals and CMS Choice            Expected Discharge Plan and Services       Living arrangements for the past 2 months: Single Family Home                                      Prior Living Arrangements/Services Living arrangements for the past 2 months: Single Family Home Lives with:: Self, Domestic Partner Patient language and need for interpreter reviewed:: Yes Do you feel safe going back to the place where you live?: Yes      Need for Family Participation in Patient Care: Yes (Comment) Care giver support system in place?: Yes (comment)   Criminal Activity/Legal Involvement Pertinent to Current Situation/Hospitalization: No - Comment as needed  Activities of Daily Living   ADL Screening (condition at time of admission) Independently performs ADLs?: No Does the patient have a NEW difficulty with bathing/dressing/toileting/self-feeding that is expected to last >3 days?: Yes (Initiates electronic notice to provider for possible OT consult) Does the patient have a NEW difficulty  with getting in/out of bed, walking, or climbing stairs that is expected to last >3 days?: Yes (Initiates electronic notice to provider for possible PT consult) Does the patient have a NEW difficulty with communication that is expected to last >3 days?: No Is the patient deaf or have difficulty hearing?: No Does the patient have difficulty seeing, even when wearing glasses/contacts?: No Does the patient have difficulty concentrating, remembering, or making decisions?: Yes  Permission Sought/Granted                  Emotional Assessment Appearance:: Appears stated age Attitude/Demeanor/Rapport: Attention Seeking, Lethargic Affect (typically observed): Pleasant, Quiet Orientation: : Oriented to Self, Oriented to Place, Oriented to  Time, Oriented to Situation Alcohol / Substance Use: Not Applicable    Admission diagnosis:  Hypokalemia [E87.6] Hypoglycemia [E16.2] QT prolongation [R94.31] Hypotension due to hypovolemia [E86.1] Hypokalemia due to excessive gastrointestinal loss of potassium [E87.6] Patient Active Problem List   Diagnosis Date Noted   Alcohol use disorder 01/23/2024   Hypotension 01/23/2024   Acute gastroenteritis 01/23/2024   Hypoalbuminemia 01/23/2024   Hypokalemia due to excessive gastrointestinal loss of potassium 01/22/2024   Hypoglycemia 01/22/2024   AKI (acute kidney injury) 01/22/2024   Anemia 01/22/2024   Thrombocytopenia 01/22/2024   Tobacco abuse 07/13/2023   Hypomagnesemia 07/13/2023   Nausea vomiting and diarrhea 07/12/2023   Hypokalemia 07/12/2023  UTI (urinary tract infection) 07/09/2019   Intractable headache 07/09/2019   Steatohepatitis, severe 07/08/2019   Alcoholic ketoacidosis 07/08/2019   Metabolic acidosis 07/08/2019   Shock (HCC) 03/29/2016   S/P laparoscopic hysterectomy 10/14/2015   Ovarian mass, right 09/24/2015   PCP:  Pcp, No Pharmacy:   [id number] [place] [place], [place] - [address] AT [facility] [address] [place] [address] Phone: [phone] Fax: [phone]  [id number] - [address] [place] - [address] [place] [address] Phone: [phone] Fax: [phone]     Social Drivers of Health (SDOH) Social History: SDOH Screenings   Food Insecurity: No Food Insecurity (01/23/2024)  Housing: Low Risk  (01/23/2024)  Transportation Needs: No Transportation Needs (01/23/2024)  Utilities: Not At Risk (01/23/2024)  Social Connections: Socially Isolated (07/13/2023)  Tobacco Use: High Risk (01/22/2024)   SDOH Interventions:     Readmission Risk Interventions     No data to display

## 2024-01-26 DIAGNOSIS — B179 Acute viral hepatitis, unspecified: Secondary | ICD-10-CM | POA: Diagnosis not present

## 2024-01-26 LAB — VITAMIN D 25 HYDROXY (VIT D DEFICIENCY, FRACTURES): Vit D, 25-Hydroxy: 17.67 ng/mL — ABNORMAL LOW (ref 30–100)

## 2024-01-26 LAB — IRON AND TIBC: Iron: 46 ug/dL (ref 28–170)

## 2024-01-26 LAB — GLUCOSE, CAPILLARY
Glucose-Capillary: 78 mg/dL (ref 70–99)
Glucose-Capillary: 66 mg/dL — ABNORMAL LOW (ref 70–99)
Glucose-Capillary: 143 mg/dL — ABNORMAL HIGH (ref 70–99)
Glucose-Capillary: 116 mg/dL — ABNORMAL HIGH (ref 70–99)
Glucose-Capillary: 114 mg/dL — ABNORMAL HIGH (ref 70–99)
Glucose-Capillary: 167 mg/dL — ABNORMAL HIGH (ref 70–99)
Glucose-Capillary: 126 mg/dL — ABNORMAL HIGH (ref 70–99)
Glucose-Capillary: 110 mg/dL — ABNORMAL HIGH (ref 70–99)

## 2024-01-26 LAB — HEPATIC FUNCTION PANEL
ALT: 108 U/L — ABNORMAL HIGH (ref 0–44)
AST: 119 U/L — ABNORMAL HIGH (ref 15–41)
Albumin: 2.2 g/dL — ABNORMAL LOW (ref 3.5–5.0)
Alkaline Phosphatase: 253 U/L — ABNORMAL HIGH (ref 38–126)
Bilirubin, Direct: 4.8 mg/dL — ABNORMAL HIGH (ref 0.0–0.2)
Indirect Bilirubin: 2.9 mg/dL — ABNORMAL HIGH (ref 0.3–0.9)
Total Bilirubin: 7.6 mg/dL — ABNORMAL HIGH (ref 0.0–1.2)
Total Protein: 3.7 g/dL — ABNORMAL LOW (ref 6.5–8.1)

## 2024-01-26 LAB — BASIC METABOLIC PANEL WITH GFR
Anion gap: 13 (ref 5–15)
BUN: 6 mg/dL — ABNORMAL LOW (ref 8–23)
CO2: 19 mmol/L — ABNORMAL LOW (ref 22–32)
Calcium: 7.7 mg/dL — ABNORMAL LOW (ref 8.9–10.3)
Chloride: 101 mmol/L (ref 98–111)
Creatinine, Ser: 0.61 mg/dL (ref 0.44–1.00)
GFR, Estimated: >60 mL/min (ref >60)
Glucose, Bld: 121 mg/dL — ABNORMAL HIGH (ref 70–99)
Potassium: 3.5 mmol/L (ref 3.5–5.1)
Sodium: 133 mmol/L — ABNORMAL LOW (ref 135–145)

## 2024-01-26 LAB — CMV DNA BY PCR, QUALITATIVE: CMV DNA, Qual PCR: NEGATIVE

## 2024-01-26 LAB — MAGNESIUM: Magnesium: 1.9 mg/dL (ref 1.7–2.4)

## 2024-01-26 LAB — PHOSPHORUS: Phosphorus: 1.6 mg/dL — ABNORMAL LOW (ref 2.5–4.6)

## 2024-01-26 MED ORDER — IRON SUCROSE 300 MG IVPB - SIMPLE MED
300.0000 mg | Freq: Once | Status: AC
  Administered 2024-01-26: 300 mg via INTRAVENOUS
  Filled 2024-01-26: qty 300

## 2024-01-26 MED ORDER — POTASSIUM PHOSPHATES 15 MMOLE/5ML IV SOLN
30.0000 mmol | Freq: Once | INTRAVENOUS | Status: AC
  Administered 2024-01-26: 30 mmol via INTRAVENOUS
  Filled 2024-01-26: qty 10

## 2024-01-26 MED ORDER — VITAMIN D (ERGOCALCIFEROL) 1.25 MG (50000 UNIT) PO CAPS
50000.0000 [IU] | ORAL_CAPSULE | ORAL | Status: DC
  Filled 2024-01-26 (×2): qty 1

## 2024-01-26 MED ORDER — SODIUM CHLORIDE 0.9 % IV BOLUS
250.0000 mL | Freq: Once | INTRAVENOUS | Status: AC
  Administered 2024-01-26: 250 mL via INTRAVENOUS

## 2024-01-26 MED ORDER — ALBUMIN HUMAN 25 % IV SOLN
12.5000 g | Freq: Four times a day (QID) | INTRAVENOUS | Status: AC
  Administered 2024-01-26 – 2024-01-27 (×4): 12.5 g via INTRAVENOUS
  Filled 2024-01-26 (×4): qty 50

## 2024-01-26 MED ORDER — SODIUM CHLORIDE 0.9 % IV BOLUS
500.0000 mL | Freq: Once | INTRAVENOUS | Status: AC
  Administered 2024-01-26: 500 mL via INTRAVENOUS

## 2024-01-26 MED ORDER — DEXTROSE-SODIUM CHLORIDE 5-0.9 % IV SOLN: INTRAVENOUS | Status: AC

## 2024-01-26 MED ORDER — MIDODRINE HCL 5 MG PO TABS
10.0000 mg | ORAL_TABLET | Freq: Three times a day (TID) | ORAL | Status: DC
  Administered 2024-01-26 – 2024-01-31 (×6): 10 mg via ORAL
  Filled 2024-01-26 (×8): qty 2

## 2024-01-26 MED ORDER — KETOROLAC TROMETHAMINE 15 MG/ML IJ SOLN
15.0000 mg | Freq: Four times a day (QID) | INTRAMUSCULAR | Status: DC | PRN
  Administered 2024-01-26 – 2024-01-27 (×3): 15 mg via INTRAVENOUS
  Filled 2024-01-26 (×3): qty 1

## 2024-01-26 MED ORDER — DEXTROSE 10 % IV SOLN: INTRAVENOUS | Status: DC

## 2024-01-26 NOTE — Progress Notes (Signed)
 Claire Copping, MD [facility]   [address], [address] [place], [place] [address] Phone: [phone] Fax : [phone]   Subjective: The patient's liver enzymes continue to improve with the AST and ALT coming down but the bilirubin continues to increase.  This is not unexpected with the patient with advanced liver disease and cirrhosis.  The patient's INR had come down yesterday but was not checked again today.  It is ordered for tomorrow.  The patient has no complaints and states that she is able to tolerate some food today.   Objective: Vital signs in last 24 hours: Vitals:   01/26/24 0257 01/26/24 0419 01/26/24 0756 01/26/24 1200  BP: (!) 90/46 (!) 81/51 (!) 83/58 (!) 87/76  Pulse:  87 86 88  Resp:  17 16   Temp:  98.6 F (37 C) 98.5 F (36.9 C) 98.8 F (37.1 C)  TempSrc:      SpO2:  92% 96% 94%  Weight:  57.5 kg    Height:       Weight change: -0.379 kg  Intake/Output Summary (Last 24 hours) at 01/26/2024 1508 Last data filed at 01/26/2024 1044 Gross per 24 hour  Intake 620 ml  Output --  Net 620 ml     Exam: Heart:: Regular rate and rhythm or without murmur or extra heart sounds Lungs: normal and clear to auscultation and percussion Abdomen: soft, nontender, normal bowel sounds   Lab Results: @LABTEST2 @ Micro Results: Recent Results (from the past 240 hours)  Resp panel by RT-PCR (RSV, Flu A&B, Covid) Anterior Nasal Swab     Status: None   Collection Time: 01/22/24  1:06 PM   Specimen: Anterior Nasal Swab  Result Value Ref Range Status   SARS Coronavirus 2 by RT PCR NEGATIVE NEGATIVE Final    Comment: (NOTE) SARS-CoV-2 target nucleic acids are NOT DETECTED.  The SARS-CoV-2 RNA is generally detectable in upper respiratory specimens during the acute phase of infection. The lowest concentration of SARS-CoV-2 viral copies this assay can detect is 138 copies/mL. A negative result does not preclude SARS-Cov-2 infection and should not be used as the sole basis  for treatment or other patient management decisions. A negative result may occur with  improper specimen collection/handling, submission of specimen other than nasopharyngeal swab, presence of viral mutation(s) within the areas targeted by this assay, and inadequate number of viral copies(<138 copies/mL). A negative result must be combined with clinical observations, patient history, and epidemiological information. The expected result is Negative.  Fact Sheet for Patients:  bloggercourse.com  Fact Sheet for Healthcare Providers:  seriousbroker.it  This test is no t yet approved or cleared by the [place]  FDA and  has been authorized for detection and/or diagnosis of SARS-CoV-2 by FDA under an Emergency Use Authorization (EUA). This EUA will remain  in effect (meaning this test can be used) for the duration of the COVID-19 declaration under Section 564(b)(1) of the Act, 21 U.S.C.section 360bbb-3(b)(1), unless the authorization is terminated  or revoked sooner.       Influenza A by PCR NEGATIVE NEGATIVE Final   Influenza B by PCR NEGATIVE NEGATIVE Final    Comment: (NOTE) The Xpert Xpress SARS-CoV-2/FLU/RSV plus assay is intended as an aid in the diagnosis of influenza from Nasopharyngeal swab specimens and should not be used as a sole basis for treatment. Nasal washings and aspirates are unacceptable for Xpert Xpress SARS-CoV-2/FLU/RSV testing.  Fact Sheet for Patients: bloggercourse.com  Fact Sheet for Healthcare Providers: seriousbroker.it  This  test is not yet approved or cleared by the [place]  FDA and has been authorized for detection and/or diagnosis of SARS-CoV-2 by FDA under an Emergency Use Authorization (EUA). This EUA will remain in effect (meaning this test can be used) for the duration of the COVID-19 declaration under Section 564(b)(1) of the Act, 21  U.S.C. section 360bbb-3(b)(1), unless the authorization is terminated or revoked.     Resp Syncytial Virus by PCR NEGATIVE NEGATIVE Final    Comment: (NOTE) Fact Sheet for Patients: bloggercourse.com  Fact Sheet for Healthcare Providers: seriousbroker.it  This test is not yet approved or cleared by the [place]  FDA and has been authorized for detection and/or diagnosis of SARS-CoV-2 by FDA under an Emergency Use Authorization (EUA). This EUA will remain in effect (meaning this test can be used) for the duration of the COVID-19 declaration under Section 564(b)(1) of the Act, 21 U.S.C. section 360bbb-3(b)(1), unless the authorization is terminated or revoked.  Performed at [facility], [address] [place], [place] [address]    Studies/Results: No results found. Medications: I have reviewed the patient's current medications. Scheduled Meds:  vitamin C   500 mg Oral BID   enoxaparin  (LOVENOX ) injection  40 mg Subcutaneous Q24H   feeding supplement  1 Container Oral TID BM   folic acid   1 mg Oral Daily   Gerhardt's butt cream   Topical BID   midodrine   10 mg Oral TID WC   multivitamin with minerals  1 tablet Oral Daily   pantoprazole   40 mg Oral QHS   thiamine   100 mg Oral Daily   Or   thiamine   100 mg Intravenous Daily   Continuous Infusions:  dextrose  5 % and 0.9 % NaCl 75 mL/hr at 01/26/24 1219   potassium PHOSPHATE IVPB (in mmol) 30 mmol (01/26/24 1156)   PRN Meds:.dextrose , ketorolac , prochlorperazine    Assessment: Principal Problem:   Steatohepatitis, severe Active Problems:   Hypokalemia due to excessive gastrointestinal loss of potassium   Hypoglycemia   AKI (acute kidney injury)   Anemia   Thrombocytopenia   Alcohol use disorder   Hypotension   Acute gastroenteritis   Hypoalbuminemia    Plan: This patient has acute hepatitis with her blood work showing that she has had a past  infection with EBV but her smooth muscle antibody and ANA were negative with a negative CMV.  Patient's acute hepatitis panel was also negative as was her antimitochondrial antibody.  The patient likely had an acute insult on top of her chronic liver disease.  Supportive therapy should be continued.   LOS: 4 days   Claire Copping, MD.FACG 01/26/2024, 3:08 PM Pager [phone] 7am-5pm  Check AMION for 5pm -7am coverage and on weekends

## 2024-01-26 NOTE — Plan of Care (Signed)
 Pt is alert oriented x 1. Pt follows commands at times. Pt bedrest unable to extend legs. Pt is incontinent of bowel and bladder. Pt has full bath and linen change. Pt cleansed and barrier applied. Pt BP running 80s systolic, blood sugar decreased to 62, PO apple juice given. Provider Mansy, MD informed, received order 250ml NS bolus, and Dextrose  5% at 23ml/hr.    Problem: Education: Goal: Knowledge of General Education information will improve Description: Including pain rating scale, medication(s)/side effects and non-pharmacologic comfort measures Outcome: Progressing   Problem: Health Behavior/Discharge Planning: Goal: Ability to manage health-related needs will improve Outcome: Progressing   Problem: Clinical Measurements: Goal: Ability to maintain clinical measurements within normal limits will improve Outcome: Progressing Goal: Will remain free from infection Outcome: Progressing Goal: Diagnostic test results will improve Outcome: Progressing Goal: Respiratory complications will improve Outcome: Progressing Goal: Cardiovascular complication will be avoided Outcome: Progressing   Problem: Activity: Goal: Risk for activity intolerance will decrease Outcome: Progressing   Problem: Nutrition: Goal: Adequate nutrition will be maintained Outcome: Progressing   Problem: Coping: Goal: Level of anxiety will decrease Outcome: Progressing   Problem: Elimination: Goal: Will not experience complications related to bowel motility Outcome: Progressing Goal: Will not experience complications related to urinary retention Outcome: Progressing   Problem: Pain Managment: Goal: General experience of comfort will improve and/or be controlled Outcome: Progressing   Problem: Safety: Goal: Ability to remain free from injury will improve Outcome: Progressing   Problem: Skin Integrity: Goal: Risk for impaired skin integrity will decrease Outcome: Progressing

## 2024-01-26 NOTE — Progress Notes (Signed)
 PROGRESS NOTE    Claire Brown  FMW:[id number] DOB: 01-Aug-1960 DOA: 01/22/2024 PCP: Pcp, No   Brief Narrative:     63 y.o. female with medical history significant for Bipolar mood disorder, alcohol and tobacco use disorder, history of admission in 2021 for alcoholic ketoacidosis, being admitted with severe steatohepatitis, AKI, hypoglycemia and electrolyte derangement.  She presented with a 1 week history of vomiting and inability to hold anything down, and difficulty swallowing. Markedly abnormal LFTs with AST/ALT 1290/256, alk phos 712 and total bilirubin 2.7  EKG showed sinus at 82 with low voltage CT soft tissue neck reflecting possible pharyngitis CT abdomen and pelvis: Possible inflammatory or infectious colitis and severe hepatic steatosis, possible gastritis RUQ ultrasound nonspecific gallbladder wall thickening GI consulted and liver enzymes are trending downwards.   Assessment & Plan:  Principal Problem:   Steatohepatitis, severe Active Problems:   Hypokalemia due to excessive gastrointestinal loss of potassium   Hypoglycemia   Hypotension   AKI (acute kidney injury)   Acute gastroenteritis   Hypoalbuminemia   Anemia   Thrombocytopenia   Alcohol use disorder    Steatohepatitis, Acute on chronic, severe Transaminitis, improving Endorses only occasional alcohol use, previous heavy drinker, stopped in May 2025 Presents with vomiting, elevated AST/ALT, alk phos, bilirubin, INR, anemia thrombocytopenia CT abdomen and pelvis showing severe hepatic steatosis, possible colitis, possible gastritis Respiratory panel, hepatitis panel are negative IV hydration IV Protonix , IV antiemetics Dietary consulted GI consulted AST 270 (Peaked at 1734), ALT 154 (peaked at 361) today   Hypokalemia due to excessive gastrointestinal loss of potassium Secondary to intractable vomiting and hypovolemia. Prn repletion  # Hypophosphatemia, Phos repleted. Monitor electrolytes and  replete as needed.    # Hypotension S/p IV fluid resuscitation with D5.  Received IV albumin  25 g x 3 doses Midodrine  increased to 10 mg 3 times daily on 12/9. 12/11 IVF D5 NS 75 mL/h NS bolus 100 mL Albumin  IV 12.5 g IV every 6 hourly x 4 doses  Hypoglycemia Suspect nutritional/Poor oral intake Similar presentation back in 2017 had a negative insulinoma workup Continue D5 infusion at 50 cc/hr, frequent CBG checks (every 4 hrs) and as needed D50   AKI (acute kidney injury) Resolved   Hypoalbuminemia Suspect nutritional/related to liver disease Dietary consulted   Acute gastroenteritis Gastritis and colitis seen on CT IV Protonix  IV fluids, IV antiemetics and supportive care   Alcohol use disorder: She quit back in May 2025 History of alcohol use disorder EtOH level undetectable CIWA withdrawal protocol   Anemia Thrombocytopenia, mild related to alcohol use Continue to monitor # Iron deficiency, iron level 46, TSAT could not be measured.  Venofer 300 mg x 1 dose given Start oral iron supplement with vitamin C  Follow-up with PCP to repeat iron profile in between 3 to 6 months  #/ Folic acid  deficiency, folic acid  level 4.7, started folic acid  1 mg p.o. daily  # Vitamin D deficiency: started vitamin D 50,000 units p.o. weekly, follow with PCP to repeat vitamin D level after 3 to 6 months.   Physical deconditioning, POA: Consulted PT/OT.  Disposition: Lives at home. Needs SNF at discharge.  DVT prophylaxis: enoxaparin  (LOVENOX ) injection 40 mg Start: 01/23/24 0800     Code Status: Full Code Family Communication: None at the bedside  Status is: Inpatient Remains inpatient appropriate because: Hypoglycemia, hypotension, Alcoholic hepatitis.    Subjective: Patient was seen and examined at bedside during morning rounds.  No significant events overnight. Patient is AO  x 1, remembers her first name only, does not remember her last name and no date of birth.   Patient was resting comfortably, unable to offer any complaints and did not follow any commands. She was resting comfortably, not in acute distress.  Examination:  General exam: Appears calm and comfortable  Respiratory system: Clear to auscultation. Respiratory effort normal. Cardiovascular system: S1 & S2 heard, RRR. No JVD, and no murmurs, No pedal edema. Gastrointestinal system: Abdomen is nondistended, soft and nontender. No organomegaly or masses felt. Normal bowel sounds heard. Central nervous system: AAO x 1. No focal neurological deficits. Extremities: No edema Skin: No rashes, lesions or ulcers      Diet Orders (From admission, onward)     Start     Ordered   01/24/24 1640  Diet regular Room service appropriate? Yes; Fluid consistency: Thin  Diet effective now       Question Answer Comment  Room service appropriate? Yes   Fluid consistency: Thin      01/24/24 1641            Objective: Vitals:   01/26/24 0419 01/26/24 0756 01/26/24 1200 01/26/24 1654  BP: (!) 81/51 (!) 83/58 (!) 87/76 (!) 81/54  Pulse: 87 86 88 72  Resp: 17 16  18   Temp: 98.6 F (37 C) 98.5 F (36.9 C) 98.8 F (37.1 C) 98.2 F (36.8 C)  TempSrc:      SpO2: 92% 96% 94% 95%  Weight: 57.5 kg     Height:        Intake/Output Summary (Last 24 hours) at 01/26/2024 1711 Last data filed at 01/26/2024 1539 Gross per 24 hour  Intake 1124.54 ml  Output --  Net 1124.54 ml   Filed Weights   01/24/24 1650 01/25/24 0500 01/26/24 0419  Weight: 57.9 kg 57.5 kg 57.5 kg    Scheduled Meds:  vitamin C   500 mg Oral BID   enoxaparin  (LOVENOX ) injection  40 mg Subcutaneous Q24H   feeding supplement  1 Container Oral TID BM   folic acid   1 mg Oral Daily   Gerhardt's butt cream   Topical BID   midodrine   10 mg Oral TID WC   multivitamin with minerals  1 tablet Oral Daily   pantoprazole   40 mg Oral QHS   thiamine   100 mg Oral Daily   Or   thiamine   100 mg Intravenous Daily   Continuous  Infusions:  dextrose  5 % and 0.9 % NaCl 75 mL/hr at 01/26/24 1539   potassium PHOSPHATE IVPB (in mmol) 85 mL/hr at 01/26/24 1539    Nutritional status Signs/Symptoms: meal completion < 25%   Body mass index is 24.76 kg/m.  Data Reviewed:   CBC: Recent Labs  Lab 01/22/24 1428 01/23/24 0543  WBC 8.0 8.1  NEUTROABS 6.4  --   HGB 10.3* 10.4*  HCT 29.4* 29.6*  MCV 113.1* 113.4*  PLT 143* 106*   Basic Metabolic Panel: Recent Labs  Lab 01/22/24 1428 01/22/24 1917 01/23/24 0900 01/25/24 0520 01/26/24 0438 01/26/24 1140  NA 139  --  136 134*  --  133*  K 2.5*  --  4.2 3.7  --  3.5  CL 94*  --  102 99  --  101  CO2 30  --  22 25  --  19*  GLUCOSE 110*  --  115* 72  --  121*  BUN 9  --  8 7*  --  6*  CREATININE 0.93  --  0.72 0.49  --  0.61  CALCIUM 7.9*  --  7.1* 7.8*  --  7.7*  MG 2.2  --  1.8 2.2 1.9  --   PHOS  --  2.8  --  1.7* 1.6*  --    GFR: Estimated Creatinine Clearance: 57.9 mL/min (by C-G formula based on SCr of 0.61 mg/dL). Liver Function Tests: Recent Labs  Lab 01/22/24 1428 01/23/24 0900 01/24/24 0411 01/25/24 0520 01/26/24 0438  AST 1,290* 1,734* 450* 270* 119*  ALT 256* 361* 161* 154* 108*  ALKPHOS 712* 632* 337* 329* 253*  BILITOT 2.7* 2.7* 3.6* 6.5* 7.6*  PROT 4.3* 4.0* 4.0* 4.1* 3.7*  ALBUMIN  1.9* 1.6* 2.7* 2.5* 2.2*   No results for input(s): LIPASE, AMYLASE in the last 168 hours. No results for input(s): AMMONIA in the last 168 hours. Coagulation Profile: Recent Labs  Lab 01/24/24 0411 01/25/24 0013  INR 3.4* 3.0*   Cardiac Enzymes: Recent Labs  Lab 01/22/24 1917  CKTOTAL 122   BNP (last 3 results) No results for input(s): PROBNP in the last 8760 hours. HbA1C: No results for input(s): HGBA1C in the last 72 hours. CBG: Recent Labs  Lab 01/26/24 0539 01/26/24 0755 01/26/24 1012 01/26/24 1202 01/26/24 1654  GLUCAP 143* 116* 114* 167* 126*   Lipid Profile: No results for input(s): CHOL, HDL, LDLCALC,  TRIG, CHOLHDL, LDLDIRECT in the last 72 hours. Thyroid  Function Tests: No results for input(s): TSH, T4TOTAL, FREET4, T3FREE, THYROIDAB in the last 72 hours.  Anemia Panel: Recent Labs    01/23/24 2304 01/26/24 1140  VITAMINB12 2,602*  --   FOLATE 4.7*  --   TIBC  --  NOT CALCULATED  IRON  --  46   Sepsis Labs: No results for input(s): PROCALCITON, LATICACIDVEN in the last 168 hours.  Recent Results (from the past 240 hours)  Resp panel by RT-PCR (RSV, Flu A&B, Covid) Anterior Nasal Swab     Status: None   Collection Time: 01/22/24  1:06 PM   Specimen: Anterior Nasal Swab  Result Value Ref Range Status   SARS Coronavirus 2 by RT PCR NEGATIVE NEGATIVE Final    Comment: (NOTE) SARS-CoV-2 target nucleic acids are NOT DETECTED.  The SARS-CoV-2 RNA is generally detectable in upper respiratory specimens during the acute phase of infection. The lowest concentration of SARS-CoV-2 viral copies this assay can detect is 138 copies/mL. A negative result does not preclude SARS-Cov-2 infection and should not be used as the sole basis for treatment or other patient management decisions. A negative result may occur with  improper specimen collection/handling, submission of specimen other than nasopharyngeal swab, presence of viral mutation(s) within the areas targeted by this assay, and inadequate number of viral copies(<138 copies/mL). A negative result must be combined with clinical observations, patient history, and epidemiological information. The expected result is Negative.  Fact Sheet for Patients:  bloggercourse.com  Fact Sheet for Healthcare Providers:  seriousbroker.it  This test is no t yet approved or cleared by the [place]  FDA and  has been authorized for detection and/or diagnosis of SARS-CoV-2 by FDA under an Emergency Use Authorization (EUA). This EUA will remain  in effect (meaning this test  can be used) for the duration of the COVID-19 declaration under Section 564(b)(1) of the Act, 21 U.S.C.section 360bbb-3(b)(1), unless the authorization is terminated  or revoked sooner.       Influenza A by PCR NEGATIVE NEGATIVE Final   Influenza B by PCR NEGATIVE NEGATIVE Final    Comment: (  NOTE) The Xpert Xpress SARS-CoV-2/FLU/RSV plus assay is intended as an aid in the diagnosis of influenza from Nasopharyngeal swab specimens and should not be used as a sole basis for treatment. Nasal washings and aspirates are unacceptable for Xpert Xpress SARS-CoV-2/FLU/RSV testing.  Fact Sheet for Patients: bloggercourse.com  Fact Sheet for Healthcare Providers: seriousbroker.it  This test is not yet approved or cleared by the [place]  FDA and has been authorized for detection and/or diagnosis of SARS-CoV-2 by FDA under an Emergency Use Authorization (EUA). This EUA will remain in effect (meaning this test can be used) for the duration of the COVID-19 declaration under Section 564(b)(1) of the Act, 21 U.S.C. section 360bbb-3(b)(1), unless the authorization is terminated or revoked.     Resp Syncytial Virus by PCR NEGATIVE NEGATIVE Final    Comment: (NOTE) Fact Sheet for Patients: bloggercourse.com  Fact Sheet for Healthcare Providers: seriousbroker.it  This test is not yet approved or cleared by the [place]  FDA and has been authorized for detection and/or diagnosis of SARS-CoV-2 by FDA under an Emergency Use Authorization (EUA). This EUA will remain in effect (meaning this test can be used) for the duration of the COVID-19 declaration under Section 564(b)(1) of the Act, 21 U.S.C. section 360bbb-3(b)(1), unless the authorization is terminated or revoked.  Performed at [facility], [address], [place], [place] [address]          Radiology Studies: No  results found.     LOS: 4 days   Time spent= 55 mins    Elvan Sor, MD [facility]  If 7PM-7AM, please contact night-coverage  01/26/2024, 5:11 PM

## 2024-01-27 ENCOUNTER — Inpatient Hospital Stay

## 2024-01-27 ENCOUNTER — Other Ambulatory Visit: Payer: Self-pay

## 2024-01-27 DIAGNOSIS — E440 Moderate protein-calorie malnutrition: Secondary | ICD-10-CM | POA: Insufficient documentation

## 2024-01-27 LAB — BASIC METABOLIC PANEL WITH GFR
Anion gap: 11 (ref 5–15)
BUN: 6 mg/dL — ABNORMAL LOW (ref 8–23)
CO2: 20 mmol/L — ABNORMAL LOW (ref 22–32)
Calcium: 7.6 mg/dL — ABNORMAL LOW (ref 8.9–10.3)
Chloride: 101 mmol/L (ref 98–111)
Creatinine, Ser: 0.78 mg/dL (ref 0.44–1.00)
GFR, Estimated: >60 mL/min (ref >60)
Glucose, Bld: 94 mg/dL (ref 70–99)
Potassium: 3.1 mmol/L — ABNORMAL LOW (ref 3.5–5.1)
Sodium: 133 mmol/L — ABNORMAL LOW (ref 135–145)

## 2024-01-27 LAB — ALBUMIN, PLEURAL OR PERITONEAL FLUID: Albumin, Fluid: <1.5 g/dL

## 2024-01-27 LAB — HEPATIC FUNCTION PANEL
ALT: 83 U/L — ABNORMAL HIGH (ref 0–44)
AST: 69 U/L — ABNORMAL HIGH (ref 15–41)
Albumin: 2.4 g/dL — ABNORMAL LOW (ref 3.5–5.0)
Alkaline Phosphatase: 244 U/L — ABNORMAL HIGH (ref 38–126)
Bilirubin, Direct: 5.7 mg/dL — ABNORMAL HIGH (ref 0.0–0.2)
Indirect Bilirubin: 2.6 mg/dL — ABNORMAL HIGH (ref 0.3–0.9)
Total Bilirubin: 8.2 mg/dL — ABNORMAL HIGH (ref 0.0–1.2)
Total Protein: 3.9 g/dL — ABNORMAL LOW (ref 6.5–8.1)

## 2024-01-27 LAB — PROTIME-INR
INR: 3.8 — ABNORMAL HIGH (ref 0.8–1.2)
Prothrombin Time: 38.8 s — ABNORMAL HIGH (ref 11.4–15.2)

## 2024-01-27 LAB — CBC
HCT: 30.2 % — ABNORMAL LOW (ref 36.0–46.0)
Hemoglobin: 10.6 g/dL — ABNORMAL LOW (ref 12.0–15.0)
MCH: 39 pg — ABNORMAL HIGH (ref 26.0–34.0)
MCHC: 35.1 g/dL (ref 30.0–36.0)
MCV: 111 fL — ABNORMAL HIGH (ref 80.0–100.0)
Platelets: 48 K/uL — ABNORMAL LOW (ref 150–400)
RBC: 2.72 MIL/uL — ABNORMAL LOW (ref 3.87–5.11)
RDW: 16 % — ABNORMAL HIGH (ref 11.5–15.5)
WBC: 7.6 K/uL (ref 4.0–10.5)
nRBC: 0.3 % — ABNORMAL HIGH (ref 0.0–0.2)

## 2024-01-27 LAB — BODY FLUID CELL COUNT WITH DIFFERENTIAL
Eos, Fluid: 0 %
Lymphs, Fluid: 2 %
Monocyte-Macrophage-Serous Fluid: 54 % (ref 50–90)
Neutrophil Count, Fluid: 44 % — ABNORMAL HIGH (ref 0–25)
Total Nucleated Cell Count, Fluid: 10 uL (ref 0–1000)

## 2024-01-27 LAB — GLUCOSE, CAPILLARY
Glucose-Capillary: 109 mg/dL — ABNORMAL HIGH (ref 70–99)
Glucose-Capillary: 112 mg/dL — ABNORMAL HIGH (ref 70–99)
Glucose-Capillary: 128 mg/dL — ABNORMAL HIGH (ref 70–99)
Glucose-Capillary: 85 mg/dL (ref 70–99)
Glucose-Capillary: 88 mg/dL (ref 70–99)
Glucose-Capillary: 89 mg/dL (ref 70–99)

## 2024-01-27 LAB — VITAMIN B1: Vitamin B1 (Thiamine): 67.2 nmol/L (ref 66.5–200.0)

## 2024-01-27 LAB — PHOSPHORUS: Phosphorus: 3.1 mg/dL (ref 2.5–4.6)

## 2024-01-27 LAB — MAGNESIUM: Magnesium: 1.9 mg/dL (ref 1.7–2.4)

## 2024-01-27 MED ORDER — DEXTROSE-SODIUM CHLORIDE 5-0.9 % IV SOLN: INTRAVENOUS | Status: DC

## 2024-01-27 MED ORDER — IRON SUCROSE 300 MG IVPB - SIMPLE MED
300.0000 mg | Freq: Once | Status: AC
  Administered 2024-01-27: 300 mg via INTRAVENOUS
  Filled 2024-01-27: qty 300

## 2024-01-27 MED ORDER — MORPHINE SULFATE (PF) 2 MG/ML IV SOLN: 2.0000 mg | INTRAVENOUS | Status: DC | PRN

## 2024-01-27 MED ORDER — SODIUM CHLORIDE 0.9 % IV SOLN: 250.0000 mL | INTRAVENOUS | Status: DC

## 2024-01-27 MED ORDER — MORPHINE SULFATE (PF) 2 MG/ML IV SOLN
1.0000 mg | INTRAVENOUS | Status: DC | PRN
  Administered 2024-01-27 – 2024-01-28 (×3): 2 mg via INTRAVENOUS
  Filled 2024-01-27 (×3): qty 1

## 2024-01-27 MED ORDER — KETOROLAC TROMETHAMINE 15 MG/ML IJ SOLN: 15.0000 mg | Freq: Four times a day (QID) | INTRAMUSCULAR | Status: DC | PRN

## 2024-01-27 MED ORDER — PANTOPRAZOLE SODIUM 40 MG IV SOLR
40.0000 mg | Freq: Two times a day (BID) | INTRAVENOUS | Status: DC
  Administered 2024-01-27 – 2024-02-03 (×14): 40 mg via INTRAVENOUS
  Filled 2024-01-27 (×14): qty 10

## 2024-01-27 MED ORDER — VITAMIN K1 10 MG/ML IJ SOLN
10.0000 mg | Freq: Once | INTRAMUSCULAR | Status: AC
  Administered 2024-01-27: 10 mg via SUBCUTANEOUS
  Filled 2024-01-27: qty 1

## 2024-01-27 MED ORDER — SODIUM CHLORIDE 0.9% FLUSH
10.0000 mL | Freq: Two times a day (BID) | INTRAVENOUS | Status: DC
  Administered 2024-01-27 – 2024-01-29 (×4): 10 mL
  Administered 2024-01-29: 20 mL
  Administered 2024-01-30 – 2024-01-31 (×3): 10 mL
  Administered 2024-01-31 – 2024-02-01 (×2): 20 mL
  Administered 2024-02-01 – 2024-02-03 (×4): 10 mL

## 2024-01-27 MED ORDER — SODIUM CHLORIDE 0.9 % IV SOLN
2.0000 g | INTRAVENOUS | Status: DC
  Filled 2024-01-27: qty 20

## 2024-01-27 MED ORDER — CHLORHEXIDINE GLUCONATE CLOTH 2 % EX PADS
6.0000 | MEDICATED_PAD | Freq: Every day | CUTANEOUS | Status: DC
  Administered 2024-01-27 – 2024-01-31 (×5): 6 via TOPICAL

## 2024-01-27 MED ORDER — METRONIDAZOLE 500 MG/100ML IV SOLN
500.0000 mg | Freq: Two times a day (BID) | INTRAVENOUS | Status: DC
  Administered 2024-01-27 – 2024-01-28 (×2): 500 mg via INTRAVENOUS
  Filled 2024-01-27 (×3): qty 100

## 2024-01-27 MED ORDER — NOREPINEPHRINE 4 MG/250ML-% IV SOLN
0.0000 ug/min | INTRAVENOUS | Status: DC
  Filled 2024-01-27: qty 250

## 2024-01-27 MED ORDER — SODIUM CHLORIDE 0.9 % IV SOLN
2.0000 g | INTRAVENOUS | Status: DC
  Administered 2024-01-27 – 2024-01-30 (×4): 2 g via INTRAVENOUS
  Filled 2024-01-27 (×4): qty 20

## 2024-01-27 MED ORDER — LIDOCAINE HCL (PF) 1 % IJ SOLN
10.0000 mL | Freq: Once | INTRAMUSCULAR | Status: AC
  Administered 2024-01-27: 10 mL via INTRADERMAL

## 2024-01-27 MED ORDER — SODIUM CHLORIDE 0.9% FLUSH: 10.0000 mL | INTRAVENOUS | Status: DC | PRN

## 2024-01-27 MED ORDER — POTASSIUM CHLORIDE 10 MEQ/100ML IV SOLN
10.0000 meq | INTRAVENOUS | Status: AC
  Administered 2024-01-27 (×6): 10 meq via INTRAVENOUS
  Filled 2024-01-27 (×6): qty 100

## 2024-01-27 MED ORDER — SODIUM CHLORIDE 0.9 % IV BOLUS
500.0000 mL | Freq: Once | INTRAVENOUS | Status: AC
  Administered 2024-01-27: 500 mL via INTRAVENOUS

## 2024-01-27 MED ORDER — IRON SUCROSE 300 MG IVPB - SIMPLE MED: 300.0000 mg | Status: DC

## 2024-01-27 NOTE — Progress Notes (Signed)
 Nutrition Follow-up  DOCUMENTATION CODES:   Non-severe (moderate) malnutrition in context of chronic illness  INTERVENTION:   -Continue dysphagia 1 diet -Continue Boost Breeze po TID, each supplement provides 250 kcal and 9 grams of protein  -Continue MVI with minerals daily -Continue 100 mg thiamine  daily -Continue 1 mg folic acid  daily  -Continue 500 mg vitamin C  BID -Continue daily weights -Patient remains at high refeeding risk; recommend monitor potassium, magnesium  and phosphorus labs daily until stable  -If feeding tube is placed, recommend:   Initiate Osmolite 1.2 @ 25 ml/hr and increase by 10 ml every 12 hours to goal rate of 55 ml/hr.   Tube feeding regimen provides 1584 kcal (100% of needs), 73 grams of protein, and 1065 ml of H2O.    -Case discussed with RN, MD, palliative care, and SLP. Palliative care to contact family for goals of care discussions today. Discussed concern regarding patient lethargy and ability to consume PO's at this time. RD agrees with palliative care consult for goals of care. Discussed recommendation for placement of NGT temporarily, however, patient would likely not be a great candidate for long term nutrition support. Per TOC notes, plan for SNF placement ar discharge (patient unable to leave hospital with NGT if placed). Per RN, patient unable to take Po medications at this time. MD recommending comfort care  NUTRITION DIAGNOSIS:   Moderate Malnutrition related to chronic illness (Steatohepatitis) as evidenced by mild fat depletion, moderate fat depletion, mild muscle depletion, moderate muscle depletion.  Ongoing  GOAL:   Patient will meet greater than or equal to 90% of their needs  Unmet  MONITOR:   PO intake, Supplement acceptance  REASON FOR ASSESSMENT:   Consult Assessment of nutrition requirement/status  ASSESSMENT:   63 y/o female with h/o etoh abusem ovarian mass, anxiety, bipolar disorder and hiatal hernia who is admitted  with steatohepatitis and new cirrhosis.  12/12- s/p BSE- downgraded to dysphagia 1 diet with thin liquids for ease of intake  Reviewed I/O's: +805 ml x 24 hours and +4.2 L since admission  MD re consulted RD for assessment, calorie count, and diet education.   Patient was lying in bed at time of visit, moaning. Patient did not respond to voice or touch and did not respond to RD questions. Noted meal tray at bedside untouched. Documented meal completions 0%.   No weight loss noted during admission. Noted weight has ranged from 51.1-59.1 kg. RD re-examined patient and now meets criteria for moderate malnutrition.  Case discussed with SLP, who reports patient with functional swallow, but is distracted and not focused. Diet was downgraded to puree for ease of intake. SLP plan to follow for possibility of diet upgrade.   Case discussed with RN, MD, TOC, and SLP. Discussed concern regarding patient lethargy and ability to consume PO's at this time. RD agrees with palliative care consult for goals of care. Discussed recommendation for placement of NGT temporarily, however, patient would likely not be a great candidate for long term nutrition support. Per TOC notes, plan for SNF placement ar discharge (patient unable to leave hospital with NGT if placed). Per RN, patient unable to take Po medications at this time. MD recommending comfort care; palliative care to reach out to family.   Medications reviewed and include folic acid , vitamin C , MVI, protonix ., thimaine, vitamin D, dextrose  5%-0.9% sodium chloirde infusion @ 75 ml/hr, and IV albumin .   Labs reviewed: Na: 133, K: 3.1 (on IV supplementation), CBGS: 85-167 (inpatient orders for glycemic control  are none).    NUTRITION - FOCUSED PHYSICAL EXAM:  Flowsheet Row Most Recent Value  Orbital Region Mild depletion  Upper Arm Region Moderate depletion  Thoracic and Lumbar Region Mild depletion  Buccal Region Mild depletion  Temple Region Moderate  depletion  Clavicle Bone Region Mild depletion  Clavicle and Acromion Bone Region Mild depletion  Scapular Bone Region Mild depletion  Dorsal Hand Moderate depletion  Patellar Region Moderate depletion  Anterior Thigh Region Moderate depletion  Posterior Calf Region Moderate depletion  Edema (RD Assessment) Mild  Hair Reviewed  Eyes Reviewed  Mouth Reviewed  Skin Reviewed  Nails Reviewed    Diet Order:   Diet Order             DIET - DYS 1 Room service appropriate? Yes with Assist; Fluid consistency: Thin  Diet effective now                   EDUCATION NEEDS:   Education needs have been addressed  Skin:  Skin Assessment: Skin Integrity Issues: Skin Integrity Issues:: Other (Comment) Other: irritant contact dermatitis to vagina  Last BM:  01/27/24 (type 7)  Height:   Ht Readings from Last 1 Encounters:  01/22/24 5' (1.524 m)    Weight:   Wt Readings from Last 1 Encounters:  01/27/24 59.1 kg    Ideal Body Weight:  45.5 kg  BMI:  Body mass index is 25.45 kg/m.  Estimated Nutritional Needs:   Kcal:  1400-1600kcal/day  Protein:  70-80g/day  Fluid:  1.4-1.6L/day    Margery ORN, RD, LDN, CDCES Registered Dietitian III Certified Diabetes Care and Education Specialist If unable to reach this RD, please use RD Inpatient group chat on secure chat between hours of 8am-4 pm daily

## 2024-01-27 NOTE — Evaluation (Signed)
 Clinical/Bedside Swallow Evaluation Patient Details  Name: Claire Brown MRN: [id number] Date of Birth: 1960-08-01  Today's Date: 01/27/2024 Time: SLP Start Time (ACUTE ONLY): 1140 SLP Stop Time (ACUTE ONLY): 1230 SLP Time Calculation (min) (ACUTE ONLY): 50 min  Past Medical History:  Past Medical History:  Diagnosis Date   Anemia    Anxiety    Arthritis    Bipolar disorder (HCC)    Headache    Past Surgical History:  Past Surgical History:  Procedure Laterality Date   CESAREAN SECTION     CYSTOSCOPY N/A 10/14/2015   Procedure: CYSTOSCOPY;  Surgeon: Glory High, MD;  Location: [facility] ORS;  Service: Gynecology;  Laterality: N/A;   FRACTURE SURGERY Left 12/2015   Femoral Neck Pin Placement - Dr. Madelynn ([facility])   KNEE SURGERY Bilateral    LAPAROSCOPIC BILATERAL SALPINGO OOPHERECTOMY  10/14/2015   Procedure: LAPAROSCOPIC BILATERAL SALPINGO OOPHORECTOMY;  Surgeon: Glory High, MD;  Location: [facility] ORS;  Service: Gynecology;;   LAPAROSCOPIC HYSTERECTOMY N/A 10/14/2015   Procedure: HYSTERECTOMY TOTAL LAPAROSCOPIC;  Surgeon: Glory High, MD;  Location: [facility] ORS;  Service: Gynecology;  Laterality: N/A;   HPI:  Pt is a 63 y.o. female with medical history significant for Bipolar mood disorder, alcohol and tobacco use disorder, history of admission in 2021 for alcoholic ketoacidosis, being admitted with severe steatohepatitis, AKI, hypoglycemia and electrolyte derangement.  She presented with a 1 week history of vomiting and inability to hold anything down.  She had 2 soft stools with the last one being about 5 days ago.  States started feeling weak about 2 weeks ago and has occasional right upper quadrant pain.  States that she no longer drinks heavily and drinks a glass of wine once a week, no beer and no liquor   With EMS, she was hypotensive and hypoglycemic which was noted at admit also.   CT of Abd at admit: Lower chest: Trace bilateral pleural effusions and minimal  dependent lower lobe atelectasis. Small hiatal hernia.   Head CT: No acute intracranial abnormality.  2. Moderately advanced age-related involutional changes and small vessel  ischemic changes with some progression since the prior exam.    Assessment / Plan / Recommendation  Clinical Impression   Pt seen for BSE this morning- 2 attempts. Pt awakened and verbally responded to a few basic questions re: self; she appeared weak. She seemed somewhat anxious and often stated my belly hurts- this was relayed to MD (in room briefly) and to the Nurse via secure chat. Pt accepted a few po trials w/ this SLP but did not attempt to feed self. 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 Noted pt had a 1 week h/o vomiting at home PTA. On RA, afebrile, WBC WNL. Other labs are abnormal w/ hypotension noted per chart.  Pt appears to present w/ grossly functional oropharyngeal phase swallowing w/ No overt oropharyngeal phase dysphagia noted, No neuromuscular deficits noted. Pt consumed po trials w/ No overt, clinical s/s of aspiration during the po trials. Pt only accepted a few trials d/t belly hurts.  Pt appears at reduced risk for aspiration following general aspiration precautions but w/ a modified diet consistency at this time for Ease of intake in setting of Edentulous status and overall medical illness/hospitalization. Expect that when pt is feeling better and has her Dentures, she will be able to upgrade her diet consistency to her Baseline again. However, pt does have challenging factors that could impact her oropharyngeal swallowing to include Pain/discomfort(NSG aware),  fatigue/weakness, Hypotension, Edentulous status, need for support w/ feeding, min decreased focus to tasks, and reduced desire for oral intake currently. Noted Hiatal Hernia per Imaging note. These factors can increase risk for dysphagia as well as decreased oral intake overall.   During po trials, pt consumed consistencies  given/accepted w/ no overt coughing, decline in vocal quality, or change in respiratory presentation during/post trials. O2 sats 98% when checked. Oral phase appeared grossly [facility] w/ timely bolus management and control of bolus propulsion for A-P transfer for swallowing. Oral clearing achieved w/ trial consistencies. Pt was often distracted. OM Exam appeared [facility] w/ no unilateral weakness noted. Speech Clear. Full feeding assistance required/given.   Recommend a more Pureed consistency diet (dysphagia level 1) w/ well-moistened foods for ease of intake currently; Thin liquids -- carefully monitor straw use, and pt should help to Hold Cup when drinking. Recommend general aspiration precautions including sitting upright for all oral intake, small bite/sips slowly, and engaging pt in self-feeding. Feeding Support at all meals. Pills CRUSHED vs WHOLE in Puree for safer, easier swallowing.   Education given on Pills in Puree; food consistencies/prep and easy to eat options; general aspiration precautions to pt and NSG. MD updated. Recommend Dietician f/u for support. Noted Palliative Care following.  ST services will f/u next 2-3 days for ongoing assessment and potential to upgrade diet/food consistency when pt is able to. Precautions posted in room, chart.  SLP Visit Diagnosis: Dysphagia, unspecified (R13.10) (decreased focus/attention during tasks; discomfort; decondtioned/weak; Edentulous; poor intake reported)    Aspiration Risk  Mild aspiration risk;Risk for inadequate nutrition/hydration (reduced when following general aspiration precautions)    Diet Recommendation   Thin;Dysphagia 1 (puree) (gravies) = a more Pureed consistency diet (dysphagia level 1) w/ well-moistened foods for ease of intake currently; Thin liquids -- carefully monitor straw use, and pt should help to Hold Cup when drinking. Recommend general aspiration precautions including sitting upright for all oral intake, small bite/sips slowly,  and engaging pt in self-feeding. Feeding Support at all meals.   Medication Administration: Crushed with puree    Other Recommendations Recommended Consults:  (GI following; Dietician) Oral Care Recommendations: Oral care BID;Staff/trained caregiver to provide oral care     Swallow Evaluation Recommendations  See above   Assistance Recommended at Discharge  FULL at meals  Functional Status Assessment Patient has had a recent decline in their functional status and demonstrates the ability to make significant improvements in function in a reasonable and predictable amount of time.  Frequency and Duration min 2x/week  2 weeks       Prognosis Prognosis for improved oropharyngeal function: Fair (-Good) Barriers to Reach Goals: Time post onset;Severity of deficits Barriers/Prognosis Comment: decreased focus/attention during tasks; discomfort; decondtioned/weak; Edentulous; poor intake reported      Swallow Study   General Date of Onset: 01/22/24 HPI: Pt is a 63 y.o. female with medical history significant for Bipolar mood disorder, alcohol and tobacco use disorder, history of admission in 2021 for alcoholic ketoacidosis, being admitted with severe steatohepatitis, AKI, hypoglycemia and electrolyte derangement.  She presented with a 1 week history of vomiting and inability to hold anything down.  She had 2 soft stools with the last one being about 5 days ago.  States started feeling weak about 2 weeks ago and has occasional right upper quadrant pain.  States that she no longer drinks heavily and drinks a glass of wine once a week, no beer and no liquor   With EMS, she was  hypotensive and hypoglycemic which was noted at admit also.   CT of Abd at admit: Lower chest: Trace bilateral pleural effusions and minimal dependent lower lobe atelectasis. Small hiatal hernia.   Head CT: No acute intracranial abnormality.  2. Moderately advanced age-related involutional changes and small vessel  ischemic changes  with some progression since the prior exam. Type of Study: Bedside Swallow Evaluation Previous Swallow Assessment: none Diet Prior to this Study: Regular;Thin liquids (Level 0) (not eating per report) Temperature Spikes Noted: No (wbc 7.6; per GI note: bilirubin has increased and her INR has gone up) Respiratory Status: Room air History of Recent Intubation: No Behavior/Cognition: Alert;Cooperative;Pleasant mood;Distractible;Requires cueing (appeared anxious w/ discomfort also) Oral Cavity Assessment: Within Functional Limits Oral Care Completed by SLP: Yes Oral Cavity - Dentition: Edentulous (dentures at home per pt) Vision:  (n/a) Self-Feeding Abilities: Total assist Patient Positioning: Upright in bed (full assist) Baseline Vocal Quality: Normal;Low vocal intensity Volitional Cough: Strong Volitional Swallow: Able to elicit    Oral/Motor/Sensory Function Overall Oral Motor/Sensory Function: Within functional limits   Ice Chips Ice chips: Within functional limits Presentation: Spoon (fed; 2 trials)   Thin Liquid Thin Liquid: Within functional limits Presentation: Straw (fed; 9 trials accepted) Other Comments: no more    Nectar Thick Nectar Thick Liquid: Not tested   Honey Thick Honey Thick Liquid: Not tested   Puree Puree: Within functional limits Presentation: Spoon (fed; 6 trials) Other Comments: no more   Solid     Solid: Not tested        Comer Portugal, MS, CCC-SLP Speech Language Pathologist Rehab Services; [facility] [phone] (ascom) Justyne Roell 01/27/2024,3:36 PM

## 2024-01-27 NOTE — Plan of Care (Signed)
 Palliative consult received.  Visited with Claire Brown at her bedside. She is resting in bed with eyes closed, she is able to respond with eye opening to calling of her name. She does not answer or address further orientation or ROS questions. No family or visitor at bedside during time of visit.  Attempted call to [facility]-significant other listed in contact information. Call unanswered, left HIPAA complaint VM.  PMT to attempt to visit with [name] and reach out to contact again tomorrow.  No Charge.  Waddell Lesches, DNP, AGNP-C Palliative Medicine  Please call Palliative Medicine team phone with any questions [phone]. For individual providers please see AMION.

## 2024-01-27 NOTE — Progress Notes (Signed)
 PT Cancellation Note  Patient Details Name: Claire Brown MRN: [id number] DOB: [birth year]   Cancelled Treatment:    Reason Eval/Treat Not Completed: Patient not medically ready Attempted to see pt for PT tx with pt received in bed, nurse in room, IV team in room attempting to place PICC line. Nurse reports pt receiving bolus, supplemental potassium, and morphine , pt lying in bed moaning. Will hold PT tx at this time & f/u as able.  Richerd Pinal, PT, DPT 01/27/2024, 1:44 PM   [name] 01/27/2024, 1:43 PM

## 2024-01-27 NOTE — Progress Notes (Signed)
Peripherally Inserted Central Catheter Placement  The IV Nurse has discussed with the patient and/or persons authorized to consent for the patient, the purpose of this procedure and the potential benefits and risks involved with this procedure.  The benefits include less needle sticks, lab draws from the catheter, and the patient may be discharged home with the catheter. Risks include, but not limited to, infection, bleeding, blood clot (thrombus formation), and puncture of an artery; nerve damage and irregular heartbeat and possibility to perform a PICC exchange if needed/ordered by physician.  Alternatives to this procedure were also discussed.  Bard Power PICC patient education guide, fact sheet on infection prevention and patient information card has been provided to patient /or left at bedside.    PICC Placement Documentation  PICC Double Lumen 01/27/24 Right Basilic 34 cm 1 cm (Active)  Indication for Insertion or Continuance of Line Vasoactive infusions 01/27/24 1440  Exposed Catheter (cm) 1 cm 01/27/24 1440  Site Assessment Clean, Dry, Intact 01/27/24 1440  Lumen #1 Status Saline locked;Flushed;Blood return noted 01/27/24 1440  Lumen #2 Status Saline locked;Flushed;Blood return noted 01/27/24 1440  Dressing Type Transparent;Securing device 01/27/24 1440  Dressing Status Antimicrobial disc/dressing in place;Clean, Dry, Intact 01/27/24 1440  Line Care Connections checked and tightened 01/27/24 1440  Line Adjustment (NICU/IV Team Only) No 01/27/24 1440  Dressing Intervention New dressing 01/27/24 1440  Dressing Change Due 02/14/2024 01/27/24 1440    Consent signed by boyfriend, [name] per patient request.   Matthias Merle Chenice 01/27/2024, 3:08 PM

## 2024-01-27 NOTE — Procedures (Signed)
 PROCEDURE SUMMARY:  Successful image-guided paracentesis from the right abdomen.  Yielded 1.60 liters of clear, straw-colored peritoneal fluid.  No immediate complications.  EBL: zero Patient tolerated well.   Specimen was sent for labs.  Please see imaging section of Epic for full dictation.  Carlin LABOR Aalaya Yadao PA-C 01/27/2024 4:07 PM

## 2024-01-27 NOTE — Progress Notes (Signed)
 Claire Copping, MD [facility]   [address], [address] [place], [place] [address] Phone: [phone] Fax : [phone]   Subjective: The patient's liver enzymes continue to go down although the patient's bilirubin has increased and her INR has gone up to 3.8 today.  The patient has been having waxing and waning mental status but does not appear grossly encephalopathic.   Objective: Vital signs in last 24 hours: Vitals:   01/27/24 0447 01/27/24 0500 01/27/24 0722 01/27/24 0749  BP: (!) 83/65  94/70 94/71  Pulse: (!) 110  92 93  Resp: 20  20 18   Temp: 97.6 F (36.4 C)  98.4 F (36.9 C) 98.1 F (36.7 C)  TempSrc:      SpO2: 93%  95% 93%  Weight:  59.1 kg    Height:       Weight change: 1.6 kg  Intake/Output Summary (Last 24 hours) at 01/27/2024 0947 Last data filed at 01/27/2024 9682 Gross per 24 hour  Intake 804.54 ml  Output --  Net 804.54 ml     Exam: General: Alert in bed no apparent distress.   Lab Results: @LABTEST2 @ Micro Results: Recent Results (from the past 240 hours)  Resp panel by RT-PCR (RSV, Flu A&B, Covid) Anterior Nasal Swab     Status: None   Collection Time: 01/22/24  1:06 PM   Specimen: Anterior Nasal Swab  Result Value Ref Range Status   SARS Coronavirus 2 by RT PCR NEGATIVE NEGATIVE Final    Comment: (NOTE) SARS-CoV-2 target nucleic acids are NOT DETECTED.  The SARS-CoV-2 RNA is generally detectable in upper respiratory specimens during the acute phase of infection. The lowest concentration of SARS-CoV-2 viral copies this assay can detect is 138 copies/mL. A negative result does not preclude SARS-Cov-2 infection and should not be used as the sole basis for treatment or other patient management decisions. A negative result may occur with  improper specimen collection/handling, submission of specimen other than nasopharyngeal swab, presence of viral mutation(s) within the areas targeted by this assay, and inadequate number of  viral copies(<138 copies/mL). A negative result must be combined with clinical observations, patient history, and epidemiological information. The expected result is Negative.  Fact Sheet for Patients:  bloggercourse.com  Fact Sheet for Healthcare Providers:  seriousbroker.it  This test is no t yet approved or cleared by the [place]  FDA and  has been authorized for detection and/or diagnosis of SARS-CoV-2 by FDA under an Emergency Use Authorization (EUA). This EUA will remain  in effect (meaning this test can be used) for the duration of the COVID-19 declaration under Section 564(b)(1) of the Act, 21 U.S.C.section 360bbb-3(b)(1), unless the authorization is terminated  or revoked sooner.       Influenza A by PCR NEGATIVE NEGATIVE Final   Influenza B by PCR NEGATIVE NEGATIVE Final    Comment: (NOTE) The Xpert Xpress SARS-CoV-2/FLU/RSV plus assay is intended as an aid in the diagnosis of influenza from Nasopharyngeal swab specimens and should not be used as a sole basis for treatment. Nasal washings and aspirates are unacceptable for Xpert Xpress SARS-CoV-2/FLU/RSV testing.  Fact Sheet for Patients: bloggercourse.com  Fact Sheet for Healthcare Providers: seriousbroker.it  This test is not yet approved or cleared by the [place]  FDA and has been authorized for detection and/or diagnosis of SARS-CoV-2 by FDA under an Emergency Use Authorization (EUA). This EUA will remain in effect (meaning this test can be used) for the duration of the COVID-19 declaration under Section 564(b)(1)  of the Act, 21 U.S.C. section 360bbb-3(b)(1), unless the authorization is terminated or revoked.     Resp Syncytial Virus by PCR NEGATIVE NEGATIVE Final    Comment: (NOTE) Fact Sheet for Patients: bloggercourse.com  Fact Sheet for Healthcare  Providers: seriousbroker.it  This test is not yet approved or cleared by the [place]  FDA and has been authorized for detection and/or diagnosis of SARS-CoV-2 by FDA under an Emergency Use Authorization (EUA). This EUA will remain in effect (meaning this test can be used) for the duration of the COVID-19 declaration under Section 564(b)(1) of the Act, 21 U.S.C. section 360bbb-3(b)(1), unless the authorization is terminated or revoked.  Performed at [facility] [address] [place], [place] [address]    Studies/Results: No results found. Medications: I have reviewed the patient's current medications. Scheduled Meds:  vitamin C   500 mg Oral BID   enoxaparin  (LOVENOX ) injection  40 mg Subcutaneous Q24H   feeding supplement  1 Container Oral TID BM   folic acid   1 mg Oral Daily   Gerhardt's butt cream   Topical BID   midodrine   10 mg Oral TID WC   multivitamin with minerals  1 tablet Oral Daily   pantoprazole  (PROTONIX ) IV  40 mg Intravenous Q12H   phytonadione  10 mg Subcutaneous Once   thiamine   100 mg Oral Daily   Or   thiamine   100 mg Intravenous Daily   Vitamin D (Ergocalciferol)  50,000 Units Oral Q7 days   Continuous Infusions:  albumin  human 12.5 g (01/27/24 0910)   dextrose  5 % and 0.9 % NaCl 75 mL/hr at 01/26/24 1539   iron sucrose     potassium chloride      PRN Meds:.dextrose , ketorolac , prochlorperazine    Assessment: Principal Problem:   Steatohepatitis, severe Active Problems:   Hypokalemia due to excessive gastrointestinal loss of potassium   Hypoglycemia   AKI (acute kidney injury)   Anemia   Thrombocytopenia   Alcohol use disorder   Hypotension   Acute gastroenteritis   Hypoalbuminemia    Plan: The patient's synthetic function seems to have taken a turn for the worse with the INR going from 3.4 down to 3.0 now up to 3.8.  The patient's liver enzymes have gone down despite the bilirubin increasing.  The  patient did have a CT scan and ultrasound that did not mention any sign of portal vein thrombosis or any cause for these abnormal liver enzymes.  In lieu of the patient not having any positive virology to explain this or an obvious other cause is an acute on chronic hepatitis and possibly ischemia could have happened to explain this.  I have ordered vitamin K to see if this improves the patient's INR.  Dr. Locklear will be taking over for the weekend.   LOS: 5 days   Claire Copping, MD.FACG 01/27/2024, 9:47 AM Pager [phone] 7am-5pm  Check AMION for 5pm -7am coverage and on weekends

## 2024-01-27 NOTE — Progress Notes (Addendum)
 PROGRESS NOTE    Claire Brown  FMW:[id number] DOB: Jan 04, 1961 DOA: 01/22/2024 PCP: Pcp, No   Brief Narrative:    63 y.o. female with medical history significant for Bipolar mood disorder, alcohol and tobacco use disorder, history of admission in 2021 for alcoholic ketoacidosis, being admitted with severe steatohepatitis, AKI, hypoglycemia and electrolyte derangement.  She presented with a 1 week history of vomiting and inability to hold anything down, and difficulty swallowing. Markedly abnormal LFTs with AST/ALT 1290/256, alk phos 712 and total bilirubin 2.7  EKG showed sinus at 82 with low voltage CT soft tissue neck reflecting possible pharyngitis CT abdomen and pelvis: Possible inflammatory or infectious colitis and severe hepatic steatosis, possible gastritis RUQ ultrasound nonspecific gallbladder wall thickening GI consulted and liver enzymes are trending downwards.   Assessment & Plan:  Principal Problem:   Steatohepatitis, severe Active Problems:   Hypokalemia due to excessive gastrointestinal loss of potassium   Hypoglycemia   Hypotension   AKI (acute kidney injury)   Acute gastroenteritis   Hypoalbuminemia   Anemia   Thrombocytopenia   Alcohol use disorder   Malnutrition of moderate degree    Steatohepatitis, Acute on chronic, severe Transaminitis, improving Endorses only occasional alcohol use, previous heavy drinker, stopped in May 2025 Presents with vomiting, elevated AST/ALT, alk phos, bilirubin, INR, anemia thrombocytopenia CT abdomen and pelvis showing severe hepatic steatosis, possible colitis, possible gastritis Respiratory panel, hepatitis panel are negative Continue IV hydration IV Protonix , IV antiemetics Dietary consulted GI consulted AST 270 (Peaked at 1734), ALT 154 (peaked at 361)  12/12 INR 3.8, elevated, vitamin K 10 mg subcu x 1 dose given as per GI.  Monitor INR 12/12 pharmacy consulted to start ceftriaxone  and Flagyl  for possible  intra-abdominal infection    Hypokalemia due to excessive gastrointestinal loss of potassium Secondary to intractable vomiting and hypovolemia. Prn repletion  # Hypophosphatemia, Phos repleted. Monitor electrolytes and replete as needed.    # Hypotension S/p IV fluid resuscitation with D5.  Received IV albumin  25 g x 3 doses Midodrine  increased to 10 mg 3 times daily on 12/9. 12/11 IVF D5 NS 75 mL/h NS bolus 500 mL Albumin  IV 12.5 g IV every 6 hourly x 4 doses 12/12 still blood pressure is low, NS 500 mL bolus given, if no improvement then plan is to start on Levophed Intensivist consulted, BP was not too low at that time We will continue to monitor BP As per RN patient is not able to take midodrine  orally  Acute gastroenteritis Gastritis and colitis seen on CT IV Protonix  IV fluids, IV antiemetics and supportive care 12/12 pharmacy consulted to start ceftriaxone  and Flagyl for possible intra-abdominal infection S/p paracentesis 1.6 L fluid was tapped by IR, follow fluid studies to rule out SBP  Hypoglycemia Suspect nutritional/Poor oral intake Similar presentation back in 2017 had a negative insulinoma workup S/p D5 infusion at 50 cc/hr, frequent CBG checks (every 4 hrs) and as needed D50 12/12 continue D5 NS   AKI (acute kidney injury) Resolved   Hypoalbuminemia Suspect nutritional/related to liver disease Dietary consulted      Alcohol use disorder: She quit back in May 2025 History of alcohol use disorder EtOH level undetectable CIWA withdrawal protocol   Anemia Thrombocytopenia, mild related to alcohol use Continue to monitor # Iron deficiency, iron level 46, TSAT could not be measured.  Venofer 300 mg x 2 dose given Start oral iron supplement with vitamin C  Follow-up with PCP to repeat iron profile in  btw 3 to 6 months  #/ Folic acid  deficiency, folic acid  level 4.7, started folic acid  1 mg p.o. daily  # Vitamin D deficiency: started vitamin D 50,000  units p.o. weekly, follow with PCP to repeat vitamin D level after 3 to 6 months.  Overall poor prognosis, palliative care consulted to discuss goals of care.  Patient is full code.  Physical deconditioning, POA: Consulted PT/OT.  Disposition: Lives at home. Needs SNF at discharge.  DVT prophylaxis: Place and maintain sequential compression device Start: 01/27/24 1627     Code Status: Full Code Family Communication: None at the bedside  Status is: Inpatient Remains inpatient appropriate because: Hypoglycemia, hypotension, Alcoholic hepatitis.    Subjective: Patient was seen and examined at bedside during morning rounds.  No significant events overnight. Today patient is more awake and alert, AO x 3, but still looks very lethargic.  Patient was complaining of generalized abdominal pain, no any other complaints.   Examination:  General exam: Appears calm and comfortable  Respiratory system: Clear to auscultation. Respiratory effort normal. Cardiovascular system: S1 & S2 heard, RRR. No JVD, and no murmurs Gastrointestinal system: BS +, soft, mildly distended, generalized tenderness  Central nervous system: AAO x 3. No focal deficits. Extremities: No edema Skin: No rashes, lesions or ulcers      Diet Orders (From admission, onward)     Start     Ordered   01/27/24 1239  DIET - DYS 1 Room service appropriate? Yes with Assist; Fluid consistency: Thin  Diet effective now       Comments: Extra Gravies on the meats, potatoes.  May have Oatmeal per Speech ok w/ butter/sugar. Cream Soups.  Question Answer Comment  Room service appropriate? Yes with Assist   Fluid consistency: Thin      01/27/24 1239            Objective: Vitals:   01/27/24 1101 01/27/24 1306 01/27/24 1515 01/27/24 1600  BP: (!) 87/63 93/61 95/71  96/71  Pulse: 98     Resp: 17     Temp: 98.2 F (36.8 C)     TempSrc:      SpO2: 93%  90% 91%  Weight:      Height:        Intake/Output Summary (Last  24 hours) at 01/27/2024 1626 Last data filed at 01/27/2024 1500 Gross per 24 hour  Intake 1673.55 ml  Output --  Net 1673.55 ml   Filed Weights   01/25/24 0500 01/26/24 0419 01/27/24 0500  Weight: 57.5 kg 57.5 kg 59.1 kg    Scheduled Meds:  vitamin C   500 mg Oral BID   Chlorhexidine  Gluconate Cloth  6 each Topical Daily   feeding supplement  1 Container Oral TID BM   folic acid   1 mg Oral Daily   Gerhardt's butt cream   Topical BID   midodrine   10 mg Oral TID WC   multivitamin with minerals  1 tablet Oral Daily   pantoprazole  (PROTONIX ) IV  40 mg Intravenous Q12H   sodium chloride  flush  10-40 mL Intracatheter Q12H   thiamine   100 mg Oral Daily   Or   thiamine   100 mg Intravenous Daily   Vitamin D (Ergocalciferol)  50,000 Units Oral Q7 days   Continuous Infusions:  albumin  human 12.5 g (01/27/24 0910)   cefTRIAXone  (ROCEPHIN )  IV     dextrose  5 % and 0.9 % NaCl 75 mL/hr at 01/27/24 1303    Nutritional status Signs/Symptoms: mild fat  depletion, moderate fat depletion, mild muscle depletion, moderate muscle depletion Interventions: Boost Breeze, MVI, Magic cup, Refer to RD note for recommendations Body mass index is 25.45 kg/m.  Data Reviewed:   CBC: Recent Labs  Lab 01/22/24 1428 01/23/24 0543 01/27/24 0429  WBC 8.0 8.1 7.6  NEUTROABS 6.4  --   --   HGB 10.3* 10.4* 10.6*  HCT 29.4* 29.6* 30.2*  MCV 113.1* 113.4* 111.0*  PLT 143* 106* 48*   Basic Metabolic Panel: Recent Labs  Lab 01/22/24 1428 01/22/24 1917 01/23/24 0900 01/25/24 0520 01/26/24 0438 01/26/24 1140 01/27/24 0429  NA 139  --  136 134*  --  133* 133*  K 2.5*  --  4.2 3.7  --  3.5 3.1*  CL 94*  --  102 99  --  101 101  CO2 30  --  22 25  --  19* 20*  GLUCOSE 110*  --  115* 72  --  121* 94  BUN 9  --  8 7*  --  6* 6*  CREATININE 0.93  --  0.72 0.49  --  0.61 0.78  CALCIUM 7.9*  --  7.1* 7.8*  --  7.7* 7.6*  MG 2.2  --  1.8 2.2 1.9  --  1.9  PHOS  --  2.8  --  1.7* 1.6*  --  3.1    GFR: Estimated Creatinine Clearance: 58.6 mL/min (by C-G formula based on SCr of 0.78 mg/dL). Liver Function Tests: Recent Labs  Lab 01/23/24 0900 01/24/24 0411 01/25/24 0520 01/26/24 0438 01/27/24 0429  AST 1,734* 450* 270* 119* 69*  ALT 361* 161* 154* 108* 83*  ALKPHOS 632* 337* 329* 253* 244*  BILITOT 2.7* 3.6* 6.5* 7.6* 8.2*  PROT 4.0* 4.0* 4.1* 3.7* 3.9*  ALBUMIN  1.6* 2.7* 2.5* 2.2* 2.4*   No results for input(s): LIPASE, AMYLASE in the last 168 hours. No results for input(s): AMMONIA in the last 168 hours. Coagulation Profile: Recent Labs  Lab 01/24/24 0411 01/25/24 0013 01/27/24 0733  INR 3.4* 3.0* 3.8*   Cardiac Enzymes: Recent Labs  Lab 01/22/24 1917  CKTOTAL 122   BNP (last 3 results) No results for input(s): PROBNP in the last 8760 hours. HbA1C: No results for input(s): HGBA1C in the last 72 hours. CBG: Recent Labs  Lab 01/26/24 2126 01/27/24 0005 01/27/24 0404 01/27/24 0747 01/27/24 1101  GLUCAP 110* 85 128* 112* 109*   Lipid Profile: No results for input(s): CHOL, HDL, LDLCALC, TRIG, CHOLHDL, LDLDIRECT in the last 72 hours. Thyroid  Function Tests: No results for input(s): TSH, T4TOTAL, FREET4, T3FREE, THYROIDAB in the last 72 hours.  Anemia Panel: Recent Labs    01/26/24 1140  TIBC NOT CALCULATED  IRON 46   Sepsis Labs: No results for input(s): PROCALCITON, LATICACIDVEN in the last 168 hours.  Recent Results (from the past 240 hours)  Resp panel by RT-PCR (RSV, Flu A&B, Covid) Anterior Nasal Swab     Status: None   Collection Time: 01/22/24  1:06 PM   Specimen: Anterior Nasal Swab  Result Value Ref Range Status   SARS Coronavirus 2 by RT PCR NEGATIVE NEGATIVE Final    Comment: (NOTE) SARS-CoV-2 target nucleic acids are NOT DETECTED.  The SARS-CoV-2 RNA is generally detectable in upper respiratory specimens during the acute phase of infection. The lowest concentration of SARS-CoV-2 viral  copies this assay can detect is 138 copies/mL. A negative result does not preclude SARS-Cov-2 infection and should not be used as the sole basis for treatment  or other patient management decisions. A negative result may occur with  improper specimen collection/handling, submission of specimen other than nasopharyngeal swab, presence of viral mutation(s) within the areas targeted by this assay, and inadequate number of viral copies(<138 copies/mL). A negative result must be combined with clinical observations, patient history, and epidemiological information. The expected result is Negative.  Fact Sheet for Patients:  bloggercourse.com  Fact Sheet for Healthcare Providers:  seriousbroker.it  This test is no t yet approved or cleared by the [place]  FDA and  has been authorized for detection and/or diagnosis of SARS-CoV-2 by FDA under an Emergency Use Authorization (EUA). This EUA will remain  in effect (meaning this test can be used) for the duration of the COVID-19 declaration under Section 564(b)(1) of the Act, 21 U.S.C.section 360bbb-3(b)(1), unless the authorization is terminated  or revoked sooner.       Influenza A by PCR NEGATIVE NEGATIVE Final   Influenza B by PCR NEGATIVE NEGATIVE Final    Comment: (NOTE) The Xpert Xpress SARS-CoV-2/FLU/RSV plus assay is intended as an aid in the diagnosis of influenza from Nasopharyngeal swab specimens and should not be used as a sole basis for treatment. Nasal washings and aspirates are unacceptable for Xpert Xpress SARS-CoV-2/FLU/RSV testing.  Fact Sheet for Patients: bloggercourse.com  Fact Sheet for Healthcare Providers: seriousbroker.it  This test is not yet approved or cleared by the [place]  FDA and has been authorized for detection and/or diagnosis of SARS-CoV-2 by FDA under an Emergency Use Authorization (EUA). This  EUA will remain in effect (meaning this test can be used) for the duration of the COVID-19 declaration under Section 564(b)(1) of the Act, 21 U.S.C. section 360bbb-3(b)(1), unless the authorization is terminated or revoked.     Resp Syncytial Virus by PCR NEGATIVE NEGATIVE Final    Comment: (NOTE) Fact Sheet for Patients: bloggercourse.com  Fact Sheet for Healthcare Providers: seriousbroker.it  This test is not yet approved or cleared by the [place]  FDA and has been authorized for detection and/or diagnosis of SARS-CoV-2 by FDA under an Emergency Use Authorization (EUA). This EUA will remain in effect (meaning this test can be used) for the duration of the COVID-19 declaration under Section 564(b)(1) of the Act, 21 U.S.C. section 360bbb-3(b)(1), unless the authorization is terminated or revoked.  Performed at [facility], [address], [place], [place] [address]          Radiology Studies: US  EKG SITE RITE Result Date: 01/27/2024 If [facility] image not attached, placement could not be confirmed due to current cardiac rhythm.      LOS: 5 days   Time spent= 55 mins    Elvan Sor, MD [facility]  If 7PM-7AM, please contact night-coverage  01/27/2024, 4:26 PM

## 2024-01-28 ENCOUNTER — Inpatient Hospital Stay

## 2024-01-28 DIAGNOSIS — K7030 Alcoholic cirrhosis of liver without ascites: Secondary | ICD-10-CM

## 2024-01-28 DIAGNOSIS — K7581 Nonalcoholic steatohepatitis (NASH): Secondary | ICD-10-CM | POA: Diagnosis not present

## 2024-01-28 LAB — CBC WITH DIFFERENTIAL/PLATELET
Abs Immature Granulocytes: 0.06 K/uL (ref 0.00–0.07)
Basophils Absolute: 0 K/uL (ref 0.0–0.1)
Basophils Relative: 0 %
Eosinophils Absolute: 0.1 K/uL (ref 0.0–0.5)
Eosinophils Relative: 1 %
HCT: 30.1 % — ABNORMAL LOW (ref 36.0–46.0)
Hemoglobin: 10.6 g/dL — ABNORMAL LOW (ref 12.0–15.0)
Immature Granulocytes: 1 %
Lymphocytes Relative: 13 %
Lymphs Abs: 1.1 K/uL (ref 0.7–4.0)
MCH: 35.2 pg — ABNORMAL HIGH (ref 26.0–34.0)
MCHC: 35.2 g/dL (ref 30.0–36.0)
MCV: 100 fL (ref 80.0–100.0)
Monocytes Absolute: 1 K/uL (ref 0.1–1.0)
Monocytes Relative: 12 %
Neutro Abs: 6.4 K/uL (ref 1.7–7.7)
Neutrophils Relative %: 73 %
Platelets: 17 K/uL — CL (ref 150–400)
RBC: 3.01 MIL/uL — ABNORMAL LOW (ref 3.87–5.11)
RDW: 23.1 % — ABNORMAL HIGH (ref 11.5–15.5)
WBC: 8.7 K/uL (ref 4.0–10.5)
nRBC: 0 % (ref 0.0–0.2)

## 2024-01-28 LAB — CBC
HCT: 20.1 % — ABNORMAL LOW (ref 36.0–46.0)
HCT: 21.2 % — ABNORMAL LOW (ref 36.0–46.0)
Hemoglobin: 7.1 g/dL — ABNORMAL LOW (ref 12.0–15.0)
Hemoglobin: 7.3 g/dL — ABNORMAL LOW (ref 12.0–15.0)
MCH: 39.9 pg — ABNORMAL HIGH (ref 26.0–34.0)
MCH: 38.8 pg — ABNORMAL HIGH (ref 26.0–34.0)
MCHC: 35.3 g/dL (ref 30.0–36.0)
MCHC: 34.4 g/dL (ref 30.0–36.0)
MCV: 112.9 fL — ABNORMAL HIGH (ref 80.0–100.0)
MCV: 112.8 fL — ABNORMAL HIGH (ref 80.0–100.0)
Platelets: 23 K/uL — CL (ref 150–400)
Platelets: 23 K/uL — CL (ref 150–400)
RBC: 1.78 MIL/uL — ABNORMAL LOW (ref 3.87–5.11)
RBC: 1.88 MIL/uL — ABNORMAL LOW (ref 3.87–5.11)
RDW: 16.5 % — ABNORMAL HIGH (ref 11.5–15.5)
RDW: 16.5 % — ABNORMAL HIGH (ref 11.5–15.5)
WBC: 8.6 K/uL (ref 4.0–10.5)
WBC: 9.1 K/uL (ref 4.0–10.5)
nRBC: 0.4 % — ABNORMAL HIGH (ref 0.0–0.2)
nRBC: 0 % (ref 0.0–0.2)

## 2024-01-28 LAB — PREPARE RBC (CROSSMATCH)

## 2024-01-28 LAB — HEPATIC FUNCTION PANEL
ALT: 48 U/L — ABNORMAL HIGH (ref 0–44)
AST: 47 U/L — ABNORMAL HIGH (ref 15–41)
Albumin: 2.6 g/dL — ABNORMAL LOW (ref 3.5–5.0)
Alkaline Phosphatase: 153 U/L — ABNORMAL HIGH (ref 38–126)
Bilirubin, Direct: 5.7 mg/dL — ABNORMAL HIGH (ref 0.0–0.2)
Indirect Bilirubin: 2.6 mg/dL — ABNORMAL HIGH (ref 0.3–0.9)
Total Bilirubin: 8.3 mg/dL — ABNORMAL HIGH (ref 0.0–1.2)
Total Protein: 3.5 g/dL — ABNORMAL LOW (ref 6.5–8.1)

## 2024-01-28 LAB — BASIC METABOLIC PANEL WITH GFR
Anion gap: 7 (ref 5–15)
BUN: 6 mg/dL — ABNORMAL LOW (ref 8–23)
CO2: 22 mmol/L (ref 22–32)
Calcium: 7.5 mg/dL — ABNORMAL LOW (ref 8.9–10.3)
Chloride: 107 mmol/L (ref 98–111)
Creatinine, Ser: 0.84 mg/dL (ref 0.44–1.00)
GFR, Estimated: >60 mL/min (ref >60)
Glucose, Bld: 318 mg/dL — ABNORMAL HIGH (ref 70–99)
Potassium: 3.7 mmol/L (ref 3.5–5.1)
Sodium: 135 mmol/L (ref 135–145)

## 2024-01-28 LAB — GLUCOSE, CAPILLARY
Glucose-Capillary: 71 mg/dL (ref 70–99)
Glucose-Capillary: 81 mg/dL (ref 70–99)
Glucose-Capillary: 83 mg/dL (ref 70–99)
Glucose-Capillary: 113 mg/dL — ABNORMAL HIGH (ref 70–99)
Glucose-Capillary: 99 mg/dL (ref 70–99)

## 2024-01-28 LAB — PROTIME-INR
INR: 3.7 — ABNORMAL HIGH (ref 0.8–1.2)
Prothrombin Time: 38.7 s — ABNORMAL HIGH (ref 11.4–15.2)

## 2024-01-28 LAB — CARBOXYHEMOGLOBIN - COOX: Carboxyhemoglobin: 0.9 % (ref 0.5–1.5)

## 2024-01-28 LAB — PHOSPHORUS: Phosphorus: 3.2 mg/dL (ref 2.5–4.6)

## 2024-01-28 LAB — AMMONIA: Ammonia: 100 umol/L — ABNORMAL HIGH (ref 9–35)

## 2024-01-28 LAB — MAGNESIUM: Magnesium: 1.7 mg/dL (ref 1.7–2.4)

## 2024-01-28 MED ORDER — IOHEXOL 350 MG/ML SOLN
100.0000 mL | Freq: Once | INTRAVENOUS | Status: AC | PRN
  Administered 2024-01-28: 100 mL via INTRAVENOUS

## 2024-01-28 MED ORDER — NYSTATIN 100000 UNIT/GM EX POWD
Freq: Two times a day (BID) | CUTANEOUS | Status: DC
  Filled 2024-01-28: qty 15

## 2024-01-28 MED ORDER — SODIUM CHLORIDE 0.9% IV SOLUTION: Freq: Once | INTRAVENOUS | Status: AC

## 2024-01-28 MED ORDER — LACTULOSE 10 GM/15ML PO SOLN
30.0000 g | Freq: Three times a day (TID) | ORAL | Status: DC
  Filled 2024-01-28: qty 60

## 2024-01-28 MED ORDER — LACTULOSE 10 GM/15ML PO SOLN
30.0000 g | Freq: Two times a day (BID) | ORAL | Status: DC
  Administered 2024-01-30: 11:00:00 30 g via ORAL
  Filled 2024-01-28 (×4): qty 60

## 2024-01-28 MED ORDER — LACTULOSE ENEMA
300.0000 mL | Freq: Two times a day (BID) | ORAL | Status: DC
  Administered 2024-01-28 – 2024-01-29 (×3): 300 mL via RECTAL
  Filled 2024-01-28 (×6): qty 300

## 2024-01-28 MED ORDER — DEXTROSE-SODIUM CHLORIDE 5-0.9 % IV SOLN: INTRAVENOUS | Status: AC

## 2024-01-28 NOTE — Progress Notes (Signed)
 PROGRESS NOTE    Claire Brown  FMW:[id number] DOB: 1960-04-03 DOA: 01/22/2024 PCP: Pcp, No   Brief Narrative:    63 y.o. female with medical history significant for Bipolar mood disorder, alcohol and tobacco use disorder, history of admission in 2021 for alcoholic ketoacidosis, being admitted with severe steatohepatitis, AKI, hypoglycemia and electrolyte derangement.  She presented with a 1 week history of vomiting and inability to hold anything down, and difficulty swallowing. Markedly abnormal LFTs with AST/ALT 1290/256, alk phos 712 and total bilirubin 2.7  EKG showed sinus at 82 with low voltage CT soft tissue neck reflecting possible pharyngitis CT abdomen and pelvis: Possible inflammatory or infectious colitis and severe hepatic steatosis, possible gastritis RUQ ultrasound nonspecific gallbladder wall thickening GI consulted and liver enzymes are trending downwards.   Assessment & Plan:  Principal Problem:   Steatohepatitis, severe Active Problems:   Hypokalemia due to excessive gastrointestinal loss of potassium   Hypoglycemia   Hypotension   AKI (acute kidney injury)   Acute gastroenteritis   Hypoalbuminemia   Anemia   Thrombocytopenia   Alcohol use disorder   Malnutrition of moderate degree   # Hepatic encephalopathy 12/13 ammonia level Started lactulose  oral versus enema Monitor mental status and ammonia level as needed  # Acute hepatitis secondary to EtOH abuse Acute on chronic, severe Transaminitis Endorses only occasional alcohol use, previous heavy drinker, stopped in May 2025 12/13 as per her emergency contact friend she still drinks alcohol daily Presents with vomiting, elevated AST/ALT, alk phos, bilirubin, INR, anemia thrombocytopenia CT abdomen and pelvis showing severe hepatic steatosis, possible colitis, possible gastritis Respiratory panel, hepatitis panel are negative Continue IV hydration IV Protonix , IV antiemetics Dietary consulted GI  consulted 12/12 INR 3.8, elevated, vitamin K 10 mg subcu x 1 dose given as per GI.  Monitor INR 12/12 pharmacy consulted to start ceftriaxone  and Flagyl   for possible intra-abdominal infection 12/13 d/w GI, less likely acute generalized colitis as there are no symptoms and findings could be secondary to liver cirrhosis.  Discontinued Flagyl  Continued ceftriaxone  for now for possibility of bleeding due to cirrhosis    # Hypotension S/p IV fluid resuscitation with D5.  Received IV albumin  25 g x 3 doses Midodrine  increased to 10 mg 3 times daily on 12/9. 12/11 IVF D5 NS 75 mL/h NS bolus 500 mL Albumin  IV 12.5 g IV every 6 hourly x 4 doses 12/12 still blood pressure is low, NS 500 mL bolus given, if no improvement then plan is to start on Levophed  Intensivist consulted, BP was not too low at that time We will continue to monitor BP As per RN patient is not able to take midodrine  orally   Hypoglycemia due to Suspect nutritional/Poor oral intake Similar presentation back in 2017 had a negative insulinoma workup S/p D5 infusion at 50 cc/hr, frequent CBG checks (every 4 hrs) and as needed D50 12/12 continue D5 NS  # Liver cirrhosis with ascites, SBP ruled out S/p paracentesis 1.6 L fluid was tapped by IR, fluid studies negative for SBP    # Hypokalemia due to intractable vomiting and hypovolemia.  Potassium repleted Monitor and replete prn   # Hypophosphatemia, Phos repleted. Monitor electrolytes and replete as needed.   AKI (acute kidney injury): Resolved   Hypoalbuminemia: Due to liver cirrhosis, and nutritional deficiency. S/p IV albumin  2.5 g IV every 6 hourly x 4 doses given Dietary consulted, continue oral protein supplement     Alcohol use disorder: EtOH level undetectable CIWA withdrawal  protocol Alcohol abstinence counseling and   Anemia and Thrombocytopenia due to cirrhosis 12/13 Hb 7.3, transfuse 1 unit of PRBC CT angio GI bleeding negative for acute bleeding,  generalized acute colitis, ascites, diffuse body wall edema, moderate layering pleural effusion.  Small volume gas in the urinary bladder, possible gas-forming infection. Monitor CBC daily D/w GI, recommended no EGD, no visible bleeding Continue PPI 40 mg IV twice daily Monitor H&H and transfuse   # Iron  deficiency, iron  level 46, Tsat could not be measured.  Venofer  300 mg x 2 dose given Start oral iron  supplement with vitamin C  Follow-up with PCP to repeat iron  profile in btw 3 to 6 months  #/ Folic acid  deficiency, folic acid  level 4.7, started folic acid  1 mg p.o. daily  # Vitamin D  deficiency: started vitamin D  50,000 units p.o. weekly, follow with PCP to repeat vitamin D  level after 3 to 6 months.  Overall poor prognosis, palliative care consulted to discuss goals of care.  Patient is full code.  Physical deconditioning, POA: Consulted PT/OT.  Disposition: Lives at home. Needs SNF at discharge.  DVT prophylaxis: Place and maintain sequential compression device Start: 01/27/24 1627     Code Status: Full Code Family Communication: None at the bedside  Status is: Inpatient Remains inpatient appropriate because: Hypoglycemia, hypotension, Alcoholic hepatitis. 12/13 discussed with patient's emergency contact over the phone   Subjective: Patient was seen and examined at bedside during morning rounds.  No significant events overnight. Symptom patient is awake and alert and soon she was dozing off.  Denied any complaints.  Remains lethargic  As per nutritionist she is not able to eat so kept her n.p.o. then I tried to insert a NG tube, but she was more awake and alert, so tried to give her water  with a straw and she was able to drink, then I tried Ensure with a straw, she was able to drink it.  RN was advised to give her lactulose  orally and diet order placed   Examination:  General exam: Appears calm and comfortable  Respiratory system: Clear to auscultation. Respiratory  effort normal. Cardiovascular system: S1 & S2 heard, RRR. No JVD, and no murmurs Gastrointestinal system: BS +, soft, mildly distended, generalized tenderness  Central nervous system: AAO x 3. No focal deficits. Extremities: Dependent edema bilateral elbows, no lower extremity Skin: Possible yeast infection around genital area     Diet Orders (From admission, onward)     Start     Ordered   01/28/24 1559  DIET - DYS 1 Room service appropriate? Yes; Fluid consistency: Thin  Diet effective now       Question Answer Comment  Room service appropriate? Yes   Fluid consistency: Thin      01/28/24 1558            Objective: Vitals:   01/28/24 0830 01/28/24 0900 01/28/24 1250 01/28/24 1332  BP: 98/67  96/66 (!) 87/72  Pulse: (!) 110  (!) 108 (!) 107  Resp: 18  18 18   Temp: 98 F (36.7 C)  98.2 F (36.8 C) 98.3 F (36.8 C)  TempSrc:   Oral Oral  SpO2: 90% 94% 97% 95%  Weight:      Height:        Intake/Output Summary (Last 24 hours) at 01/28/2024 1600 Last data filed at 01/27/2024 1916 Gross per 24 hour  Intake 408.84 ml  Output --  Net 408.84 ml   Filed Weights   01/26/24 0419 01/27/24 0500  01/28/24 0500  Weight: 57.5 kg 59.1 kg 59.7 kg    Scheduled Meds:  sodium chloride    Intravenous Once   vitamin C   500 mg Oral BID   Chlorhexidine  Gluconate Cloth  6 each Topical Daily   feeding supplement  1 Container Oral TID BM   folic acid   1 mg Oral Daily   Gerhardt's butt cream   Topical BID   lactulose   30 g Oral BID   Or   lactulose   300 mL Rectal BID   midodrine   10 mg Oral TID WC   multivitamin with minerals  1 tablet Oral Daily   nystatin    Topical BID   pantoprazole  (PROTONIX ) IV  40 mg Intravenous Q12H   sodium chloride  flush  10-40 mL Intracatheter Q12H   thiamine   100 mg Oral Daily   Or   thiamine   100 mg Intravenous Daily   Vitamin D  (Ergocalciferol )  50,000 Units Oral Q7 days   Continuous Infusions:  cefTRIAXone  (ROCEPHIN )  IV 2 g (01/27/24 1731)    dextrose  5 % and 0.9 % NaCl 75 mL/hr at 01/28/24 0902    Nutritional status Signs/Symptoms: mild fat depletion, moderate fat depletion, mild muscle depletion, moderate muscle depletion Interventions: Boost Breeze, MVI, Magic cup, Refer to RD note for recommendations Body mass index is 25.7 kg/m.  Data Reviewed:   CBC: Recent Labs  Lab 01/22/24 1428 01/23/24 0543 01/27/24 0429 01/28/24 0600 01/28/24 0857  WBC 8.0 8.1 7.6 8.6 9.1  NEUTROABS 6.4  --   --   --   --   HGB 10.3* 10.4* 10.6* 7.1* 7.3*  HCT 29.4* 29.6* 30.2* 20.1* 21.2*  MCV 113.1* 113.4* 111.0* 112.9* 112.8*  PLT 143* 106* 48* 23* 23*   Basic Metabolic Panel: Recent Labs  Lab 01/22/24 1917 01/23/24 0900 01/25/24 0520 01/26/24 0438 01/26/24 1140 01/27/24 0429 01/28/24 0600  NA  --  136 134*  --  133* 133* 135  K  --  4.2 3.7  --  3.5 3.1* 3.7  CL  --  102 99  --  101 101 107  CO2  --  22 25  --  19* 20* 22  GLUCOSE  --  115* 72  --  121* 94 318*  BUN  --  8 7*  --  6* 6* 6*  CREATININE  --  0.72 0.49  --  0.61 0.78 0.84  CALCIUM  --  7.1* 7.8*  --  7.7* 7.6* 7.5*  MG  --  1.8 2.2 1.9  --  1.9 1.7  PHOS 2.8  --  1.7* 1.6*  --  3.1 3.2   GFR: Estimated Creatinine Clearance: 56.1 mL/min (by C-G formula based on SCr of 0.84 mg/dL). Liver Function Tests: Recent Labs  Lab 01/24/24 0411 01/25/24 0520 01/26/24 0438 01/27/24 0429 01/28/24 0600  AST 450* 270* 119* 69* 47*  ALT 161* 154* 108* 83* 48*  ALKPHOS 337* 329* 253* 244* 153*  BILITOT 3.6* 6.5* 7.6* 8.2* 8.3*  PROT 4.0* 4.1* 3.7* 3.9* 3.5*  ALBUMIN  2.7* 2.5* 2.2* 2.4* 2.6*   No results for input(s): LIPASE, AMYLASE in the last 168 hours. Recent Labs  Lab 01/28/24 1142  AMMONIA 100*   Coagulation Profile: Recent Labs  Lab 01/24/24 0411 01/25/24 0013 01/27/24 0733 01/28/24 0500  INR 3.4* 3.0* 3.8* 3.7*   Cardiac Enzymes: Recent Labs  Lab 01/22/24 1917  CKTOTAL 122   BNP (last 3 results) No results for input(s): PROBNP  in the last 8760  hours. HbA1C: No results for input(s): HGBA1C in the last 72 hours. CBG: Recent Labs  Lab 01/27/24 2013 01/27/24 2318 01/28/24 0344 01/28/24 0826 01/28/24 1204  GLUCAP 88 89 71 81 83   Lipid Profile: No results for input(s): CHOL, HDL, LDLCALC, TRIG, CHOLHDL, LDLDIRECT in the last 72 hours. Thyroid  Function Tests: No results for input(s): TSH, T4TOTAL, FREET4, T3FREE, THYROIDAB in the last 72 hours.  Anemia Panel: Recent Labs    01/26/24 1140  TIBC NOT CALCULATED  IRON  46   Sepsis Labs: No results for input(s): PROCALCITON, LATICACIDVEN in the last 168 hours.  Recent Results (from the past 240 hours)  Resp panel by RT-PCR (RSV, Flu A&B, Covid) Anterior Nasal Swab     Status: None   Collection Time: 01/22/24  1:06 PM   Specimen: Anterior Nasal Swab  Result Value Ref Range Status   SARS Coronavirus 2 by RT PCR NEGATIVE NEGATIVE Final    Comment: (NOTE) SARS-CoV-2 target nucleic acids are NOT DETECTED.  The SARS-CoV-2 RNA is generally detectable in upper respiratory specimens during the acute phase of infection. The lowest concentration of SARS-CoV-2 viral copies this assay can detect is 138 copies/mL. A negative result does not preclude SARS-Cov-2 infection and should not be used as the sole basis for treatment or other patient management decisions. A negative result may occur with  improper specimen collection/handling, submission of specimen other than nasopharyngeal swab, presence of viral mutation(s) within the areas targeted by this assay, and inadequate number of viral copies(<138 copies/mL). A negative result must be combined with clinical observations, patient history, and epidemiological information. The expected result is Negative.  Fact Sheet for Patients:  bloggercourse.com  Fact Sheet for Healthcare Providers:  seriousbroker.it  This test is no t yet  approved or cleared by the [place]  FDA and  has been authorized for detection and/or diagnosis of SARS-CoV-2 by FDA under an Emergency Use Authorization (EUA). This EUA will remain  in effect (meaning this test can be used) for the duration of the COVID-19 declaration under Section 564(b)(1) of the Act, 21 U.S.C.section 360bbb-3(b)(1), unless the authorization is terminated  or revoked sooner.       Influenza A by PCR NEGATIVE NEGATIVE Final   Influenza B by PCR NEGATIVE NEGATIVE Final    Comment: (NOTE) The Xpert Xpress SARS-CoV-2/FLU/RSV plus assay is intended as an aid in the diagnosis of influenza from Nasopharyngeal swab specimens and should not be used as a sole basis for treatment. Nasal washings and aspirates are unacceptable for Xpert Xpress SARS-CoV-2/FLU/RSV testing.  Fact Sheet for Patients: bloggercourse.com  Fact Sheet for Healthcare Providers: seriousbroker.it  This test is not yet approved or cleared by the [place]  FDA and has been authorized for detection and/or diagnosis of SARS-CoV-2 by FDA under an Emergency Use Authorization (EUA). This EUA will remain in effect (meaning this test can be used) for the duration of the COVID-19 declaration under Section 564(b)(1) of the Act, 21 U.S.C. section 360bbb-3(b)(1), unless the authorization is terminated or revoked.     Resp Syncytial Virus by PCR NEGATIVE NEGATIVE Final    Comment: (NOTE) Fact Sheet for Patients: bloggercourse.com  Fact Sheet for Healthcare Providers: seriousbroker.it  This test is not yet approved or cleared by the [place]  FDA and has been authorized for detection and/or diagnosis of SARS-CoV-2 by FDA under an Emergency Use Authorization (EUA). This EUA will remain in effect (meaning this test can be used) for the duration of the COVID-19 declaration under Section  564(b)(1) of  the Act, 21 U.S.C. section 360bbb-3(b)(1), unless the authorization is terminated or revoked.  Performed at [facility], [address] [place], [place] [address]   Body fluid culture w Gram Stain     Status: None (Preliminary result)   Collection Time: 01/27/24  3:30 PM   Specimen: PATH Cytology Peritoneal fluid  Result Value Ref Range Status   Specimen Description   Final    PERITONEAL Performed at [facility], [address], [place], [place] [address]    Special Requests   Final    NONE Performed at [facility], [address] [place], [place] [address]    Gram Stain   Final    WBC PRESENT, PREDOMINANTLY PMN NO ORGANISMS SEEN CYTOSPIN SMEAR    Culture   Final    NO GROWTH < 24 HOURS Performed at [facility], [address], [place], [place] [address]    Report Status PENDING  Incomplete         Radiology Studies: CT ANGIO GI BLEED Result Date: 01/28/2024 EXAM: CTA ABDOMEN AND PELVIS WITH CONTRAST 01/28/2024 10:48:09 AM TECHNIQUE: CTA images of the abdomen and pelvis with intravenous contrast. Three-dimensional MIP/volume rendered formations were performed. Automated exposure control, iterative reconstruction, and/or weight based adjustment of the mA/kV was utilized to reduce the radiation dose to as low as reasonably achievable. The early arterial phase images do not include the pelvis. COMPARISON: CT abdomen and pelvis 01/22/2024. CLINICAL HISTORY: 63 year old female with lower gastrointestinal bleed, rule out gastrointestinal bleeding. History of hepatitis, ascites, and status post ultrasound guided paracentesis yesterday. FINDINGS: VASCULATURE: GI BLEED: No active extravasation of contrast within the GI tract. Extensive aortoiliac calcified atherosclerosis. Major arterial structures and the portal venous system in the abdomen and pelvis are patent. ABDOMEN/PELVIS: LOWER CHEST: Moderate layering pleural effusions have progressed.  Increased streaky and dependent lung base opacity most resembling atelectasis. Chronic right lower rib fractures. Calcified coronary artery and aortic atherosclerosis. LIVER: Hepatic steatosis redemonstrated. Stable liver enhancement. GALLBLADDER AND BILE DUCTS: Ongoing gallbladder distention and wall thickening. No progressive pericholecystic inflammation. No biliary ductal dilatation. SPLEEN: No splenomegaly. The spleen is unremarkable. PANCREAS: The pancreas is unremarkable. ADRENAL GLANDS: Bilateral adrenal glands demonstrate no acute abnormality. KIDNEYS, URETERS AND BLADDER: No stones in the kidneys or ureters. No hydronephrosis. No perinephric or periureteral stranding. Small volume of gas in the urinary bladde GI AND BOWEL: Generalized colonic wall thickening and edema from the cecum through to the splenic flexure. The descending colon is less affected. The rectosigmoid colon is decompressed but appears largely normal. Stomach and small bowel loops are also decompressed diffusely. Appendix appears to remain normal in the right lower quadrant on series 16 image 73. No contrast extravasation identified into the gastrointestinal lumen. There is no bowel obstruction. No abnormal bowel wall distension. Generalized acute colitis. Differential considerations include infectious colitis, portal hypertensive colopathy enteropathy. REPRODUCTIVE: Reproductive organs are unremarkable. PERITONEUM AND RETROPERITONEUM: Abdominal and pelvic ascites with simple fluid density, volume not significantly changed from the previous CT. No free air. LYMPH NODES: No lymphadenopathy. BONES AND SOFT TISSUES: Chronic right lower rib fractures. Chronic lumbar spine degeneration. Chronic proximal left femur ORIF. Generalized body wall edema / anasarca is substantially progressed from the CT comparison. IMPRESSION: 1. Abdominal and pelvic ascites with simple fluid density, volume not significantly changed from prior CT. Progressed diffuse  body wall edema, Moderate layering pleural effusions,. 2. Generalized Acute Colitis (cecum through splenic flexure). Main differential considerations include infectious  colitis and portal hypertensive enteropathy. 3. Small volume of gas in the urinary bladder, raising the possibility of gas-forming infection if not explained by recent catheterization. 4. No active gastrointestinal bleeding on CTA. 5. Hepatic steatosis with stable hepatic enhancement. Electronically signed by: Helayne Hurst MD 01/28/2024 11:14 AM EST RP Workstation: [id number]   US  Paracentesis Result Date: 01/27/2024 INDICATION: [id number] Abdominal ascites [id number] 63 year old female admitted with severe steatohepatittis, AKI, with 1 week history of abdominal pain, nausea and vomiting. Imaging noting mild ascites. IR was requested for diagnostic and therapeutic paracentesis, to rule out possible SBP. EXAM: ULTRASOUND GUIDED DIAGNOSTIC AND THERAPEUTIC PARACENTESIS MEDICATIONS: 5 mL of 1% lidocaine . COMPLICATIONS: None immediate. PROCEDURE: Informed written consent was obtained from the patient after a discussion of the risks, benefits and alternatives to treatment. A timeout was performed prior to the initiation of the procedure. Initial ultrasound scanning demonstrates a large amount of ascites within the right lower abdominal quadrant. The right lower abdomen was prepped and draped in the usual sterile fashion. 1% lidocaine  was used for local anesthesia. Following this, a 19 gauge, 7-cm, Yueh catheter was introduced. An ultrasound image was saved for documentation purposes. The paracentesis was performed. The catheter was removed and a dressing was applied. The patient tolerated the procedure well without immediate post procedural complication. FINDINGS: A total of approximately 1.6 L of clear, straw-colored peritoneal fluid was removed. Samples were sent to the laboratory as requested by the clinical team. IMPRESSION: Successful ultrasound-guided  paracentesis yielding 1.6 L of peritoneal fluid. Procedure performed by: Carlin Griffon, PA-C PLAN: If the patient eventually requires >/=2 paracenteses in a 30 day period, candidacy for formal evaluation by the [facility] will be assessed. Thom Hall, MD [facility] Electronically Signed   By: Thom Hall M.D.   On: 01/27/2024 16:57   US  EKG SITE RITE Result Date: 01/27/2024 If Site Rite image not attached, placement could not be confirmed due to current cardiac rhythm.      LOS: 6 days   Time spent= 55 mins    Elvan Sor, MD [facility]  If 7PM-7AM, please contact night-coverage  01/28/2024, 4:00 PM

## 2024-01-28 NOTE — Consult Note (Signed)
 Consultation Note Date: 01/28/2024   Patient Name: Claire Brown  DOB: 1960-07-29  MRN: [id number]  Age / Sex: 63 y.o., female  PCP: Pcp, No Referring Physician: Von Bellis, MD  Reason for Consultation: Establishing goals of care   HPI/Brief Hospital Course: 63 y.o. female  with past medical history of bipolar disorder, cirrhosis and ETOH and tobacco use admitted from home on 01/22/2024 with complaints of N/V/D and associated poor PO intake x 1 week.  In ED found to have significantly elevated liver enzymes--now normalized, elevated bilirubin and INR Found to have severe steatohepatitis, GI consulted and following close Anemic-requiring blood transfusions Encephalopathic--CIWA protocol  Palliative medicine was consulted for assisting with goals of care conversations.  Subjective:  Extensive chart review has been completed prior to meeting patient including labs, vital signs, imaging, progress notes, orders, and available advanced directive documents from current and previous encounters.  Visited with Ms. Dunshee at her bedside. She is resting in bed with eyes closed but easily awakens to calling of her name. She is able to state her name, repeats name when asked further orientation--not improved with coaching or redirection. Continues to repeat name. No family or visitors at bedside during time of visit.  Introduced myself as a publishing rights manager as a member of the palliative care team. Explained palliative medicine is specialized medical care for people living with serious illness. It focuses on providing relief from the symptoms and stress of a serious illness. The goal is to improve quality of life for both the patient and the family.   Ms. Damman appears comfortable without obvious signs of discomfort or distress. Noted grimacing on abdominal palpation--resolves when not touched.  Attempted to engage in conversation. Ms. Finger shares her  emergency contact listed at [name] is her long term partner. She is unsure if she has children but thinks she may have a son--unable to provide his name. She nods when asked if they are estranged. Inquired who Ms. Meeker would like for her medical team to contact in regards to her medical care--she responds with [name].  Called and spoke with Chris--he confirms an 11 year relationship with Ms. Latouche, they are not legally married. [name] shares he believes Ms. Olenik has a son--unable to provide contact information--inquired about names listed an inactive contacts such as [name] Ada and [employer] believes [name] Ada is her son. [name] confirms estrangement from son for quite some time. [name] shares he is not aware of Ms. Dilday completing Advanced Directives or appointing HCPOA. [name] shares in the past Ms. Amparan has turned to him for decision making.  [name] able to share his understanding of current medical condition and severity. He shares he has been getting regular updates from medical team.  Attempted to elicit goals of care. [name] shares he and Ms. Cianci have never really discussed goals of care and her wishes. He shares during this hospital admission, Ms. Commisso was able to share herself she wished to remain Full Code/Full Scope and he would want to honor her wishes.  [name] shares he and Ms. Bowery do not have a vehicle, they rely on friends/family for transportation. He is hopeful to catch a ride with his mother tomorrow to the hospital when she gets out of church--early afternoon. Agrees to PMT meeting at bedside during his visit tomorrow.  I discussed importance of continued conversations with family/support persons and all members of their medical team regarding overall plan of care and treatment options ensuring decisions are in alignment with  patients goals of care.  All questions/concerns addressed. Emotional support provided to patient/family/support persons. PMT will  continue to follow and support patient as needed.   Objective: Primary Diagnoses: Present on Admission:  Hypokalemia due to excessive gastrointestinal loss of potassium  Steatohepatitis, severe   Physical Exam Constitutional:      General: She is not in acute distress.    Appearance: She is ill-appearing.     Comments: Drowsy  HENT:     Head: Normocephalic.     Mouth/Throat:     Mouth: Mucous membranes are dry.  Pulmonary:     Effort: Pulmonary effort is normal. No respiratory distress.  Abdominal:     General: There is distension.     Palpations: Abdomen is soft.     Tenderness: There is abdominal tenderness.  Skin:    General: Skin is warm and dry.     Coloration: Skin is jaundiced.     Findings: Bruising present.  Neurological:     Mental Status: She is easily aroused.     Motor: Weakness present.     Comments: Oriented to self     Vital Signs: BP (!) 87/72   Pulse (!) 107   Temp 98.3 F (36.8 C) (Oral)   Resp 18   Ht 5' (1.524 m)   Wt 59.7 kg   SpO2 95%   BMI 25.70 kg/m  Pain Scale: Faces   Pain Score: Asleep  IO: Intake/output summary:  Intake/Output Summary (Last 24 hours) at 01/28/2024 1459 Last data filed at 01/27/2024 1916 Gross per 24 hour  Intake 1782.39 ml  Output --  Net 1782.39 ml    LBM: Last BM Date : 01/27/24 Baseline Weight: Weight: 51.1 kg Most recent weight: Weight: 59.7 kg      Assessment and Plan  SUMMARY OF RECOMMENDATIONS   Continue with current plan of care Ongoing GOC--attempt to secure NOK  Palliative Prophylaxis:   Bowel Regimen, Delirium Protocol and Frequent Pain Assessment  Thank you for this consult and allowing Palliative Medicine to participate in the care of Lety B. Doo. Palliative medicine will continue to follow and assist as needed.   Visit includes: Detailed review of medical records (labs, imaging, vital signs), medically appropriate exam (mental status, respiratory, cardiac, skin), discussed with  treatment team, counseling and educating patient, family and staff, documenting clinical information, medication management and coordination of care.   Signed by: Waddell Lesches, DNP, AGNP-C Palliative Medicine    Please contact Palliative Medicine Team phone at [phone] for questions and concerns.  For individual provider: See Tracey

## 2024-01-28 NOTE — Progress Notes (Signed)
 GI Inpatient Follow-up Note  Subjective:  Patient seen and was somewhat disoriented. I did discuss her alcohol use with her partner over the phone and he notes that she drinks daily.  Scheduled Inpatient Medications:   sodium chloride    Intravenous Once   vitamin C   500 mg Oral BID   Chlorhexidine  Gluconate Cloth  6 each Topical Daily   feeding supplement  1 Container Oral TID BM   folic acid   1 mg Oral Daily   Gerhardt's butt cream   Topical BID   midodrine   10 mg Oral TID WC   multivitamin with minerals  1 tablet Oral Daily   nystatin    Topical BID   pantoprazole  (PROTONIX ) IV  40 mg Intravenous Q12H   sodium chloride  flush  10-40 mL Intracatheter Q12H   thiamine   100 mg Oral Daily   Or   thiamine   100 mg Intravenous Daily   Vitamin D  (Ergocalciferol )  50,000 Units Oral Q7 days    Continuous Inpatient Infusions:    cefTRIAXone  (ROCEPHIN )  IV 2 g (01/27/24 1731)   dextrose  5 % and 0.9 % NaCl 75 mL/hr at 01/28/24 0902    PRN Inpatient Medications:  dextrose , prochlorperazine , sodium chloride  flush  Review of Systems:  Unable to assess due to mental status   Physical Examination: BP 98/67 (BP Location: Right Leg)   Pulse (!) 110   Temp 98 F (36.7 C)   Resp 18   Ht 5' (1.524 m)   Wt 59.7 kg   SpO2 94%   BMI 25.70 kg/m  Gen: Chronically ill appearing HEENT: PEERLA, EOMI, Neck: supple, no JVD or thyromegaly Chest: No respiratory distress Abd: soft, mildy distended Ext: trace edema, well perfused with 2+ pulses, Skin: no rash or lesions noted Lymph: no LAD  Data: Lab Results  Component Value Date   WBC 9.1 01/28/2024   HGB 7.3 (L) 01/28/2024   HCT 21.2 (L) 01/28/2024   MCV 112.8 (H) 01/28/2024   PLT 23 (LL) 01/28/2024   Recent Labs  Lab 01/27/24 0429 01/28/24 0600 01/28/24 0857  HGB 10.6* 7.1* 7.3*   Lab Results  Component Value Date   NA 135 01/28/2024   K 3.7 01/28/2024   CL 107 01/28/2024   CO2 22 01/28/2024   BUN 6 (L) 01/28/2024    CREATININE 0.84 01/28/2024   Lab Results  Component Value Date   ALT 48 (H) 01/28/2024   AST 47 (H) 01/28/2024   ALKPHOS 153 (H) 01/28/2024   BILITOT 8.3 (H) 01/28/2024   Recent Labs  Lab 01/28/24 0500  INR 3.7*   Assessment/Plan: Ms. Sinkler is a 63 y.o. lady with chronic alcohol use who presented with nausea/vomiting and abnormal liver enzymes in a pattern consistent with alcoholic hepatitis. MELD 3.0 score of 31. Will need supportive care.  Recommendations:  - IV fluids - nutrition (may need NG tube for nutrition) - daily CMP and INR - agree with palliative care assessment - she is not a transplant candidate for any of our local institutions - could consider n acetylcysteine but given she already been here a week and AST/ALT almost normalized, likely would not be of benefit - steroids would also be another option but somewhat concerned with the infectious risk for her - avoid sedating medicines (dc'ed morphine ) - monitor for withdrawal - IV thiamine  - transfuse to keep hemoglobin > 7 - no overt GI bleeding so no plans for any endoscopy  Somewhat poor prognosis, will continue to follow. Please call  with any questions or concerns.  Frederic Schick MD, MPH [facility] GI

## 2024-01-28 NOTE — Progress Notes (Signed)
 PT Cancellation Note  Patient Details Name: ESTEPHANIE HUBBS MRN: [id number] DOB: 12-23-1960   Cancelled Treatment:     Pt somewhat disoriented. Remains lethargic with low BP. Unable to hold cup during Speech session earlier. Hgb 7.3 with orders to transfuse. Will re-attempt next available date/time per POC.    Darice JAYSON Bohr 01/28/2024, 4:53 PM

## 2024-01-28 NOTE — Progress Notes (Signed)
 Speech Language Pathology Treatment: Dysphagia  Patient Details Name: Claire Brown MRN: [id number] DOB: 03-Sep-1960 Today's Date: 01/28/2024 Time: 1250-1310 SLP Time Calculation (min) (ACUTE ONLY): 20 min  Assessment / Plan / Recommendation Clinical Impression  Pt seen for follow up dysphagia intervention this date. Pt alert, perseverating on OK across verbal prompts. Grimacing noted with elevation of HOB, though no verbal complaint of pain. Monitored during session. 3L nasal canula in place, afebrile, and WBC 9.1. RN holding trays this shift secondary to aspiration risk/mentation. Single trial completed of thin liquid with moderate-max visual, tactile cues and extended time. Total assist to hold cup despite attempt at hand over hand placement. Limited labial pull noted on straw. Oral holding noted, with eventual A-P transfer and suspected delayed swallow initiation. Delayed throat clear following completion.   Given level of cuing required for engagement/limited awareness in task, no further trials completed. Based on mentation, total assist for feeding, and acute deconditioning, pt is at significant risk for aspiration. Recommend NPO, with the hopes to return to PO diet with improved mentation. MD aware of recommendations. SLP will continue to follow.    HPI HPI: Pt is a 63 y.o. female with medical history significant for Bipolar mood disorder, alcohol and tobacco use disorder, history of admission in 2021 for alcoholic ketoacidosis, being admitted with severe steatohepatitis, AKI, hypoglycemia and electrolyte derangement.  She presented with a 1 week history of vomiting and inability to hold anything down.  She had 2 soft stools with the last one being about 5 days ago.  States started feeling weak about 2 weeks ago and has occasional right upper quadrant pain.  States that she no longer drinks heavily and drinks a glass of wine once a week, no beer and no liquor   With EMS, she was hypotensive  and hypoglycemic which was noted at admit also.   CT of Abd at admit: Lower chest: Trace bilateral pleural effusions and minimal dependent lower lobe atelectasis. Small hiatal hernia.   Head CT: No acute intracranial abnormality.  2. Moderately advanced age-related involutional changes and small vessel  ischemic changes with some progression since the prior exam.      SLP Plan  Continue with current plan of care        Swallow Evaluation Recommendations   Recommendations: NPO Medication Administration: Via alternative means Oral care recommendations: Oral care QID (4x/day);Staff/trained caregiver to provide oral care     Recommendations                     Oral care QID;Staff/trained caregiver to provide oral care   Frequent or constant Supervision/Assistance Dysphagia, unspecified (R13.10) (decreased focus/attention during tasks; discomfort; decondtioned/weak; Edentulous; poor intake reported)     Continue with current plan of care    Mohannad Olivero Clapp, MS, CCC-SLP Speech Language Pathologist Rehab Services; [facility] [phone] (ascom)   Zeeva Courser J Clapp  01/28/2024, 1:12 PM

## 2024-01-29 ENCOUNTER — Inpatient Hospital Stay

## 2024-01-29 ENCOUNTER — Inpatient Hospital Stay
Admit: 2024-01-29 | Discharge: 2024-01-29 | Disposition: A | Discharge status: Home / Self Care | Attending: Student | Admitting: Student

## 2024-01-29 DIAGNOSIS — K7581 Nonalcoholic steatohepatitis (NASH): Secondary | ICD-10-CM | POA: Diagnosis not present

## 2024-01-29 LAB — CBC
HCT: 28.5 % — ABNORMAL LOW (ref 36.0–46.0)
Hemoglobin: 10.2 g/dL — ABNORMAL LOW (ref 12.0–15.0)
MCH: 35.5 pg — ABNORMAL HIGH (ref 26.0–34.0)
MCHC: 35.8 g/dL (ref 30.0–36.0)
MCV: 99.3 fL (ref 80.0–100.0)
Platelets: 14 K/uL — CL (ref 150–400)
RBC: 2.87 MIL/uL — ABNORMAL LOW (ref 3.87–5.11)
RDW: 24.2 % — ABNORMAL HIGH (ref 11.5–15.5)
WBC: 9.5 K/uL (ref 4.0–10.5)
nRBC: 0.3 % — ABNORMAL HIGH (ref 0.0–0.2)

## 2024-01-29 LAB — TRIGLYCERIDES, BODY FLUIDS: Triglycerides, Fluid: 45 mg/dL

## 2024-01-29 LAB — MAGNESIUM: Magnesium: 1.7 mg/dL (ref 1.7–2.4)

## 2024-01-29 LAB — HEPATIC FUNCTION PANEL
ALT: 46 U/L — ABNORMAL HIGH (ref 0–44)
AST: 46 U/L — ABNORMAL HIGH (ref 15–41)
Albumin: 2.5 g/dL — ABNORMAL LOW (ref 3.5–5.0)
Alkaline Phosphatase: 161 U/L — ABNORMAL HIGH (ref 38–126)
Bilirubin, Direct: 6.9 mg/dL — ABNORMAL HIGH (ref 0.0–0.2)
Indirect Bilirubin: 3.3 mg/dL — ABNORMAL HIGH (ref 0.3–0.9)
Total Bilirubin: 10.2 mg/dL — ABNORMAL HIGH (ref 0.0–1.2)
Total Protein: 3.7 g/dL — ABNORMAL LOW (ref 6.5–8.1)

## 2024-01-29 LAB — TYPE AND SCREEN
ABO/RH(D): A POS
Antibody Screen: NEGATIVE
Unit division: 0

## 2024-01-29 LAB — PROTIME-INR
INR: 3.2 — ABNORMAL HIGH (ref 0.8–1.2)
Prothrombin Time: 33.8 s — ABNORMAL HIGH (ref 11.4–15.2)

## 2024-01-29 LAB — GLUCOSE, CAPILLARY
Glucose-Capillary: 101 mg/dL — ABNORMAL HIGH (ref 70–99)
Glucose-Capillary: 104 mg/dL — ABNORMAL HIGH (ref 70–99)
Glucose-Capillary: 108 mg/dL — ABNORMAL HIGH (ref 70–99)
Glucose-Capillary: 120 mg/dL — ABNORMAL HIGH (ref 70–99)
Glucose-Capillary: 160 mg/dL — ABNORMAL HIGH (ref 70–99)
Glucose-Capillary: 87 mg/dL (ref 70–99)
Glucose-Capillary: 94 mg/dL (ref 70–99)

## 2024-01-29 LAB — ECHOCARDIOGRAM COMPLETE
AR max vel: 1.75 cm2
AV Area VTI: 1.63 cm2
AV Area mean vel: 1.47 cm2
AV Mean grad: 2 mmHg
AV Peak grad: 3.8 mmHg
Ao pk vel: 0.98 m/s
Area-P 1/2: 7.29 cm2
Height: 60 in
MV VTI: 1.75 cm2
S' Lateral: 2.4 cm
Weight: 2127 [oz_av]

## 2024-01-29 LAB — BASIC METABOLIC PANEL WITH GFR
Anion gap: 11 (ref 5–15)
BUN: 6 mg/dL — ABNORMAL LOW (ref 8–23)
CO2: 20 mmol/L — ABNORMAL LOW (ref 22–32)
Calcium: 7.9 mg/dL — ABNORMAL LOW (ref 8.9–10.3)
Chloride: 109 mmol/L (ref 98–111)
Creatinine, Ser: 0.71 mg/dL (ref 0.44–1.00)
GFR, Estimated: >60 mL/min (ref >60)
Glucose, Bld: 105 mg/dL — ABNORMAL HIGH (ref 70–99)
Potassium: 3.6 mmol/L (ref 3.5–5.1)
Sodium: 140 mmol/L (ref 135–145)

## 2024-01-29 LAB — BPAM RBC
Blood Product Expiration Date: 202601112359
ISSUE DATE / TIME: 202512131256
Unit Type and Rh: 6200

## 2024-01-29 LAB — PRO BRAIN NATRIURETIC PEPTIDE: Pro Brain Natriuretic Peptide: 1750 pg/mL — ABNORMAL HIGH (ref <300.0)

## 2024-01-29 MED ORDER — THIAMINE HCL 100 MG/ML IJ SOLN
500.0000 mg | Freq: Three times a day (TID) | INTRAVENOUS | Status: AC
  Administered 2024-01-29 – 2024-02-01 (×9): 500 mg via INTRAVENOUS
  Filled 2024-01-29 (×11): qty 5

## 2024-01-29 MED ORDER — PERFLUTREN LIPID MICROSPHERE
1.0000 mL | INTRAVENOUS | Status: AC | PRN
  Administered 2024-01-29: 2 mL via INTRAVENOUS

## 2024-01-29 MED ORDER — DEXTROSE-SODIUM CHLORIDE 5-0.9 % IV SOLN: INTRAVENOUS | Status: DC

## 2024-01-29 MED ORDER — OSMOLITE 1.5 CAL PO LIQD
1000.0000 mL | ORAL | Status: AC
  Administered 2024-01-30: 11:00:00 1000 mL

## 2024-01-29 MED ORDER — PROSOURCE TF20 ENFIT COMPATIBL EN LIQD
60.0000 mL | Freq: Every day | ENTERAL | Status: DC
  Administered 2024-01-30: 11:00:00 60 mL

## 2024-01-29 MED ORDER — SODIUM CHLORIDE 0.9% IV SOLUTION: Freq: Once | INTRAVENOUS | Status: AC

## 2024-01-29 NOTE — Progress Notes (Signed)
 PROGRESS NOTE    Claire Brown  FMW:[id number] DOB: Jul 25, 1960 DOA: 01/22/2024 PCP: Pcp, No    Brief Narrative:   63 y.o. female with medical history significant for Bipolar mood disorder, alcohol and tobacco use disorder, history of admission in 2021 for alcoholic ketoacidosis, being admitted with severe steatohepatitis, AKI, hypoglycemia and electrolyte derangement.  She presented with a 1 week history of vomiting and inability to hold anything down, and difficulty swallowing. Markedly abnormal LFTs with AST/ALT 1290/256, alk phos 712 and total bilirubin 2.7  EKG showed sinus at 82 with low voltage CT soft tissue neck reflecting possible pharyngitis CT abdomen and pelvis: Possible inflammatory or infectious colitis and severe hepatic steatosis, possible gastritis RUQ ultrasound nonspecific gallbladder wall thickening GI consulted and liver enzymes are trending downwards.  Assessment & Plan:   Principal Problem:   Steatohepatitis, severe Active Problems:   Hypokalemia due to excessive gastrointestinal loss of potassium   Hypoglycemia   Hypotension   AKI (acute kidney injury)   Acute gastroenteritis   Hypoalbuminemia   Anemia   Thrombocytopenia   Alcohol use disorder   Malnutrition of moderate degree  # Hepatic encephalopathy 12/13 ammonia level 100 Patient's oral tolerance has been questionable at best Plan: Lactulose  via p.o. or enema NGT ordered High-dose IV thiamine    # Acute hepatitis secondary to EtOH abuse Acute on chronic, severe Transaminitis Endorses only occasional alcohol use, previous heavy drinker, stopped in May 2025 12/13 as per her emergency contact friend she still drinks alcohol daily Presents with vomiting, elevated AST/ALT, alk phos, bilirubin, INR, anemia thrombocytopenia CT abdomen and pelvis showing severe hepatic steatosis, possible colitis, possible gastritis Respiratory panel, hepatitis panel are negative Continue IV hydration IV  Protonix , IV antiemetics Dietary consulted GI consulted 12/12 INR 3.8, elevated, vitamin K 10 mg subcu x 1 dose given as per GI.  Monitor INR 12/12 pharmacy consulted to start ceftriaxone  and Flagyl   for possible intra-abdominal infection 12/13 d/w GI, less likely acute generalized colitis as there are no symptoms and findings could be secondary to liver cirrhosis.  Discontinued Flagyl  Plan: Continued ceftriaxone  for now for possibility of bleeding due to cirrhosis Can consider N-acetylcysteine versus steroids Needs nutrition      # Hypotension Somewhat persistent but seems to be responding to midodrine  Plan: Continue midodrine      Hypoglycemia due to Suspect nutritional/Poor oral intake Similar presentation back in 2017 had a negative insulinoma workup S/p D5 infusion at 50 cc/hr, frequent CBG checks (every 4 hrs) and as needed D50 Plan: Dextrose  containing fluids for now    # Liver cirrhosis with ascites, SBP ruled out S/p paracentesis 1.6 L fluid was tapped by IR, fluid studies negative for SBP      # Hypokalemia due to intractable vomiting and hypovolemia.  Potassium repleted Monitor and replete prn    # Hypophosphatemia, Phos repleted. Monitor electrolytes and replete as needed.   AKI (acute kidney injury): Resolved   Hypoalbuminemia: Due to liver cirrhosis, and nutritional deficiency. S/p IV albumin  2.5 g IV every 6 hourly x 4 doses given Dietary consulted, continue oral protein supplement     Alcohol use disorder: EtOH level undetectable CIWA withdrawal protocol Alcohol abstinence counseling and   Anemia and Thrombocytopenia due to cirrhosis 12/13 Hb 7.3, transfuse 1 unit of PRBC CT angio GI bleeding negative for acute bleeding, generalized acute colitis, ascites, diffuse body wall edema, moderate layering pleural effusion.  Small volume gas in the urinary bladder, possible gas-forming infection. Monitor CBC daily D/w  GI, recommended no EGD, no visible  bleeding Continue PPI 40 mg IV twice daily Monitor H&H and transfuse     # Iron  deficiency, iron  level 46, Tsat could not be measured.  Venofer  300 mg x 2 dose given Start oral iron  supplement with vitamin C  Follow-up with PCP to repeat iron  profile in btw 3 to 6 months   #/ Folic acid  deficiency, folic acid  level 4.7, started folic acid  1 mg p.o. daily   # Vitamin D  deficiency: started vitamin D  50,000 units p.o. weekly, follow with PCP to repeat vitamin D  level after 3 to 6 months.   Overall poor prognosis, palliative care consulted to discuss goals of care.  Patient is full code.   Physical deconditioning, POA: Consulted PT/OT.   Disposition: Lives at home. Needs SNF at discharge.    DVT prophylaxis: SCD Code Status: Full Family Communication: None Disposition Plan: Status is: Inpatient Remains inpatient appropriate because: Multiple acute issues as above   Level of care: Progressive  Consultants:  GI Palliative care  Procedures:  Paracentesis  Antimicrobials: Ceftriaxone    Subjective: Seen and examined.  Does open eyes and answer simple questions but overall remains lethargic and encephalopathic  Objective: Vitals:   01/29/24 0539 01/29/24 0830 01/29/24 1159 01/29/24 1250  BP: 118/72 115/79 98/87 92/63   Pulse: (!) 102 (!) 104  (!) 104  Resp: 14 16 18 19   Temp: 98.4 F (36.9 C) 97.7 F (36.5 C) 98.3 F (36.8 C) 98.1 F (36.7 C)  TempSrc:    Axillary  SpO2: (!) 88% 95% 95% 97%  Weight:      Height:        Intake/Output Summary (Last 24 hours) at 01/29/2024 1356 Last data filed at 01/29/2024 1000 Gross per 24 hour  Intake 6509.71 ml  Output --  Net 6509.71 ml   Filed Weights   01/27/24 0500 01/28/24 0500 01/29/24 0500  Weight: 59.1 kg 59.7 kg 60.3 kg    Examination:  General exam: Prior to, encephalopathic, appears chronically ill Respiratory system: Clear to auscultation. Respiratory effort normal. Cardiovascular system: 1 S2, RRR, no  murmurs, no pedal edema Gastrointestinal system: Soft, T/ND, normal bowel sounds Central nervous system: Alert but lethargic.  Unable to assess orientation Extremities: Power decreased symmetrically Skin: No rashes, lesions or ulcers Psychiatry: Judgement and insight appear impaired. Mood & affect blunted.     Data Reviewed: I have personally reviewed following labs and imaging studies  CBC: Recent Labs  Lab 01/22/24 1428 01/23/24 0543 01/27/24 0429 01/28/24 0600 01/28/24 0857 01/28/24 1845 01/29/24 0528  WBC 8.0   < > 7.6 8.6 9.1 8.7 9.5  NEUTROABS 6.4  --   --   --   --  6.4  --   HGB 10.3*   < > 10.6* 7.1* 7.3* 10.6* 10.2*  HCT 29.4*   < > 30.2* 20.1* 21.2* 30.1* 28.5*  MCV 113.1*   < > 111.0* 112.9* 112.8* 100.0 99.3  PLT 143*   < > 48* 23* 23* 17* 14*   < > = values in this interval not displayed.   Basic Metabolic Panel: Recent Labs  Lab 01/22/24 1917 01/23/24 0900 01/25/24 0520 01/26/24 0438 01/26/24 1140 01/27/24 0429 01/28/24 0600 01/29/24 0528  NA  --    < > 134*  --  133* 133* 135 140  K  --    < > 3.7  --  3.5 3.1* 3.7 3.6  CL  --    < > 99  --  101 101 107 109  CO2  --    < > 25  --  19* 20* 22 20*  GLUCOSE  --    < > 72  --  121* 94 318* 105*  BUN  --    < > 7*  --  6* 6* 6* 6*  CREATININE  --    < > 0.49  --  0.61 0.78 0.84 0.71  CALCIUM  --    < > 7.8*  --  7.7* 7.6* 7.5* 7.9*  MG  --    < > 2.2 1.9  --  1.9 1.7 1.7  PHOS 2.8  --  1.7* 1.6*  --  3.1 3.2  --    < > = values in this interval not displayed.   GFR: Estimated Creatinine Clearance: 59.2 mL/min (by C-G formula based on SCr of 0.71 mg/dL). Liver Function Tests: Recent Labs  Lab 01/25/24 0520 01/26/24 0438 01/27/24 0429 01/28/24 0600 01/29/24 0528  AST 270* 119* 69* 47* 46*  ALT 154* 108* 83* 48* 46*  ALKPHOS 329* 253* 244* 153* 161*  BILITOT 6.5* 7.6* 8.2* 8.3* 10.2*  PROT 4.1* 3.7* 3.9* 3.5* 3.7*  ALBUMIN  2.5* 2.2* 2.4* 2.6* 2.5*   No results for input(s): LIPASE,  AMYLASE in the last 168 hours. Recent Labs  Lab 01/28/24 1142  AMMONIA 100*   Coagulation Profile: Recent Labs  Lab 01/24/24 0411 01/25/24 0013 01/27/24 0733 01/28/24 0500 01/29/24 0559  INR 3.4* 3.0* 3.8* 3.7* 3.2*   Cardiac Enzymes: Recent Labs  Lab 01/22/24 1917  CKTOTAL 122   BNP (last 3 results) Recent Labs    01/29/24 0528  PROBNP 1,750.0*   HbA1C: No results for input(s): HGBA1C in the last 72 hours. CBG: Recent Labs  Lab 01/29/24 0042 01/29/24 0047 01/29/24 0532 01/29/24 0826 01/29/24 1156  GLUCAP 87 104* 101* 108* 160*   Lipid Profile: No results for input(s): CHOL, HDL, LDLCALC, TRIG, CHOLHDL, LDLDIRECT in the last 72 hours. Thyroid  Function Tests: No results for input(s): TSH, T4TOTAL, FREET4, T3FREE, THYROIDAB in the last 72 hours. Anemia Panel: No results for input(s): VITAMINB12, FOLATE, FERRITIN, TIBC, IRON , RETICCTPCT in the last 72 hours. Sepsis Labs: No results for input(s): PROCALCITON, LATICACIDVEN in the last 168 hours.  Recent Results (from the past 240 hours)  Resp panel by RT-PCR (RSV, Flu A&B, Covid) Anterior Nasal Swab     Status: None   Collection Time: 01/22/24  1:06 PM   Specimen: Anterior Nasal Swab  Result Value Ref Range Status   SARS Coronavirus 2 by RT PCR NEGATIVE NEGATIVE Final    Comment: (NOTE) SARS-CoV-2 target nucleic acids are NOT DETECTED.  The SARS-CoV-2 RNA is generally detectable in upper respiratory specimens during the acute phase of infection. The lowest concentration of SARS-CoV-2 viral copies this assay can detect is 138 copies/mL. A negative result does not preclude SARS-Cov-2 infection and should not be used as the sole basis for treatment or other patient management decisions. A negative result may occur with  improper specimen collection/handling, submission of specimen other than nasopharyngeal swab, presence of viral mutation(s) within the areas targeted  by this assay, and inadequate number of viral copies(<138 copies/mL). A negative result must be combined with clinical observations, patient history, and epidemiological information. The expected result is Negative.  Fact Sheet for Patients:  bloggercourse.com  Fact Sheet for Healthcare Providers:  seriousbroker.it  This test is no t yet approved or cleared by the [place]  FDA and  has been  authorized for detection and/or diagnosis of SARS-CoV-2 by FDA under an Emergency Use Authorization (EUA). This EUA will remain  in effect (meaning this test can be used) for the duration of the COVID-19 declaration under Section 564(b)(1) of the Act, 21 U.S.C.section 360bbb-3(b)(1), unless the authorization is terminated  or revoked sooner.       Influenza A by PCR NEGATIVE NEGATIVE Final   Influenza B by PCR NEGATIVE NEGATIVE Final    Comment: (NOTE) The Xpert Xpress SARS-CoV-2/FLU/RSV plus assay is intended as an aid in the diagnosis of influenza from Nasopharyngeal swab specimens and should not be used as a sole basis for treatment. Nasal washings and aspirates are unacceptable for Xpert Xpress SARS-CoV-2/FLU/RSV testing.  Fact Sheet for Patients: bloggercourse.com  Fact Sheet for Healthcare Providers: seriousbroker.it  This test is not yet approved or cleared by the [place]  FDA and has been authorized for detection and/or diagnosis of SARS-CoV-2 by FDA under an Emergency Use Authorization (EUA). This EUA will remain in effect (meaning this test can be used) for the duration of the COVID-19 declaration under Section 564(b)(1) of the Act, 21 U.S.C. section 360bbb-3(b)(1), unless the authorization is terminated or revoked.     Resp Syncytial Virus by PCR NEGATIVE NEGATIVE Final    Comment: (NOTE) Fact Sheet for Patients: bloggercourse.com  Fact  Sheet for Healthcare Providers: seriousbroker.it  This test is not yet approved or cleared by the [place]  FDA and has been authorized for detection and/or diagnosis of SARS-CoV-2 by FDA under an Emergency Use Authorization (EUA). This EUA will remain in effect (meaning this test can be used) for the duration of the COVID-19 declaration under Section 564(b)(1) of the Act, 21 U.S.C. section 360bbb-3(b)(1), unless the authorization is terminated or revoked.  Performed at [facility], [address] [place], [place] [address]   Body fluid culture w Gram Stain     Status: None (Preliminary result)   Collection Time: 01/27/24  3:30 PM   Specimen: PATH Cytology Peritoneal fluid  Result Value Ref Range Status   Specimen Description   Final    PERITONEAL Performed at [facility], [address] [place], [place] [address]    Special Requests   Final    NONE Performed at [facility], [address] [place], [place] [address]    Gram Stain   Final    WBC PRESENT, PREDOMINANTLY PMN NO ORGANISMS SEEN CYTOSPIN SMEAR    Culture   Final    NO GROWTH 2 DAYS Performed at [facility], [address], [place], [place] [address]    Report Status PENDING  Incomplete         Radiology Studies: ECHOCARDIOGRAM COMPLETE Result Date: 01/29/2024    ECHOCARDIOGRAM REPORT   Patient Name:   ZEANNA SUNDE Date of Exam: 01/29/2024 Medical Rec #:  [id number]         Height:       60.0 in Accession #:    [id number]        Weight:       132.9 lb Date of Birth:  1960-07-11         BSA:          1.569 m Patient Age:    63 years          BP:           118/72 mmHg Patient Gender: F  HR:           100 bpm. Exam Location:  [facility] Procedure: 2D Echo, Cardiac Doppler, Color Doppler and Intracardiac            Opacification Agent (Both Spectral and Color Flow Doppler were            utilized during procedure). Indications:      CHF-Acute Systolic I50.21  History:         Patient has prior history of Echocardiogram examinations.                  Signs/Symptoms:Hypotension. Headache.  Sonographer:     Bari Roar Referring Phys:  [id number] ELVAN SOR Diagnosing Phys: Marsa Dooms MD  Sonographer Comments: Technically difficult study due to poor echo windows. IMPRESSIONS  1. Left ventricular ejection fraction, by estimation, is 60 to 65%. The left ventricle has normal function. The left ventricle has no regional wall motion abnormalities. Left ventricular diastolic parameters are consistent with Grade I diastolic dysfunction (impaired relaxation).  2. Right ventricular systolic function is normal. The right ventricular size is normal.  3. The mitral valve is normal in structure. Trivial mitral valve regurgitation. No evidence of mitral stenosis.  4. The aortic valve is normal in structure. Aortic valve regurgitation is not visualized. No aortic stenosis is present.  5. The inferior vena cava is normal in size with greater than 50% respiratory variability, suggesting right atrial pressure of 3 mmHg. FINDINGS  Left Ventricle: Left ventricular ejection fraction, by estimation, is 60 to 65%. The left ventricle has normal function. The left ventricle has no regional wall motion abnormalities. Definity  contrast agent was given IV to delineate the left ventricular  endocardial borders. Strain was performed and the global longitudinal strain is indeterminate. The left ventricular internal cavity size was normal in size. There is no left ventricular hypertrophy. Left ventricular diastolic parameters are consistent with Grade I diastolic dysfunction (impaired relaxation). Right Ventricle: The right ventricular size is normal. No increase in right ventricular wall thickness. Right ventricular systolic function is normal. Left Atrium: Left atrial size was normal in size. Right Atrium: Right atrial size was normal in size. Pericardium: There is no  evidence of pericardial effusion. Mitral Valve: The mitral valve is normal in structure. Trivial mitral valve regurgitation. No evidence of mitral valve stenosis. MV peak gradient, 3.0 mmHg. The mean mitral valve gradient is 1.0 mmHg. Tricuspid Valve: The tricuspid valve is normal in structure. Tricuspid valve regurgitation is trivial. No evidence of tricuspid stenosis. Aortic Valve: The aortic valve is normal in structure. Aortic valve regurgitation is not visualized. No aortic stenosis is present. Aortic valve mean gradient measures 2.0 mmHg. Aortic valve peak gradient measures 3.8 mmHg. Aortic valve area, by VTI measures 1.63 cm. Pulmonic Valve: The pulmonic valve was normal in structure. Pulmonic valve regurgitation is not visualized. No evidence of pulmonic stenosis. Aorta: The aortic root is normal in size and structure. Venous: The inferior vena cava is normal in size with greater than 50% respiratory variability, suggesting right atrial pressure of 3 mmHg. IAS/Shunts: No atrial level shunt detected by color flow Doppler. Additional Comments: 3D was performed not requiring image post processing on an independent workstation and was indeterminate.  LEFT VENTRICLE PLAX 2D LVIDd:         3.60 cm   Diastology LVIDs:         2.40 cm   LV e' medial:    7.94 cm/s LV PW:  0.80 cm   LV E/e' medial:  8.4 LV IVS:        1.00 cm   LV e' lateral:   6.74 cm/s LVOT diam:     1.60 cm   LV E/e' lateral: 9.9 LV SV:         28 LV SV Index:   18 LVOT Area:     2.01 cm  RIGHT VENTRICLE RV Basal diam:  2.20 cm RV Mid diam:    2.20 cm RV S prime:     12.50 cm/s TAPSE (M-mode): 1.8 cm LEFT ATRIUM             Index        RIGHT ATRIUM          Index LA diam:        3.10 cm 1.98 cm/m   RA Area:     5.19 cm LA Vol (A2C):   23.7 ml 15.10 ml/m  RA Volume:   7.48 ml  4.77 ml/m LA Vol (A4C):   21.6 ml 13.77 ml/m LA Biplane Vol: 23.4 ml 14.91 ml/m  AORTIC VALVE                    PULMONIC VALVE AV Area (Vmax):    1.75 cm      PV Vmax:        0.96 m/s AV Area (Vmean):   1.47 cm     PV Peak grad:   3.7 mmHg AV Area (VTI):     1.63 cm     RVOT Peak grad: 3 mmHg AV Vmax:           98.10 cm/s AV Vmean:          72.000 cm/s AV VTI:            0.173 m AV Peak Grad:      3.8 mmHg AV Mean Grad:      2.0 mmHg LVOT Vmax:         85.40 cm/s LVOT Vmean:        52.700 cm/s LVOT VTI:          0.140 m LVOT/AV VTI ratio: 0.81  AORTA Ao Root diam: 2.30 cm Ao Asc diam:  2.70 cm MITRAL VALVE MV Area (PHT): 7.29 cm     SHUNTS MV Area VTI:   1.75 cm     Systemic VTI:  0.14 m MV Peak grad:  3.0 mmHg     Systemic Diam: 1.60 cm MV Mean grad:  1.0 mmHg MV Vmax:       0.87 m/s MV Vmean:      48.9 cm/s MV Decel Time: 104 msec MV E velocity: 66.60 cm/s MV A velocity: 107.00 cm/s MV E/A ratio:  0.62 MV A Prime:    10.3 cm/s Marsa Dooms MD Electronically signed by Marsa Dooms MD Signature Date/Time: 01/29/2024/1:02:07 PM    Final    CT ANGIO GI BLEED Result Date: 01/28/2024 EXAM: CTA ABDOMEN AND PELVIS WITH CONTRAST 01/28/2024 10:48:09 AM TECHNIQUE: CTA images of the abdomen and pelvis with intravenous contrast. Three-dimensional MIP/volume rendered formations were performed. Automated exposure control, iterative reconstruction, and/or weight based adjustment of the mA/kV was utilized to reduce the radiation dose to as low as reasonably achievable. The early arterial phase images do not include the pelvis. COMPARISON: CT abdomen and pelvis 01/22/2024. CLINICAL HISTORY: 63 year old female with lower gastrointestinal bleed, rule out gastrointestinal bleeding. History of hepatitis, ascites, and status post ultrasound guided  paracentesis yesterday. FINDINGS: VASCULATURE: GI BLEED: No active extravasation of contrast within the GI tract. Extensive aortoiliac calcified atherosclerosis. Major arterial structures and the portal venous system in the abdomen and pelvis are patent. ABDOMEN/PELVIS: LOWER CHEST: Moderate layering pleural effusions have  progressed. Increased streaky and dependent lung base opacity most resembling atelectasis. Chronic right lower rib fractures. Calcified coronary artery and aortic atherosclerosis. LIVER: Hepatic steatosis redemonstrated. Stable liver enhancement. GALLBLADDER AND BILE DUCTS: Ongoing gallbladder distention and wall thickening. No progressive pericholecystic inflammation. No biliary ductal dilatation. SPLEEN: No splenomegaly. The spleen is unremarkable. PANCREAS: The pancreas is unremarkable. ADRENAL GLANDS: Bilateral adrenal glands demonstrate no acute abnormality. KIDNEYS, URETERS AND BLADDER: No stones in the kidneys or ureters. No hydronephrosis. No perinephric or periureteral stranding. Small volume of gas in the urinary bladde GI AND BOWEL: Generalized colonic wall thickening and edema from the cecum through to the splenic flexure. The descending colon is less affected. The rectosigmoid colon is decompressed but appears largely normal. Stomach and small bowel loops are also decompressed diffusely. Appendix appears to remain normal in the right lower quadrant on series 16 image 73. No contrast extravasation identified into the gastrointestinal lumen. There is no bowel obstruction. No abnormal bowel wall distension. Generalized acute colitis. Differential considerations include infectious colitis, portal hypertensive colopathy enteropathy. REPRODUCTIVE: Reproductive organs are unremarkable. PERITONEUM AND RETROPERITONEUM: Abdominal and pelvic ascites with simple fluid density, volume not significantly changed from the previous CT. No free air. LYMPH NODES: No lymphadenopathy. BONES AND SOFT TISSUES: Chronic right lower rib fractures. Chronic lumbar spine degeneration. Chronic proximal left femur ORIF. Generalized body wall edema / anasarca is substantially progressed from the CT comparison. IMPRESSION: 1. Abdominal and pelvic ascites with simple fluid density, volume not significantly changed from prior CT.  Progressed diffuse body wall edema, Moderate layering pleural effusions,. 2. Generalized Acute Colitis (cecum through splenic flexure). Main differential considerations include infectious colitis and portal hypertensive enteropathy. 3. Small volume of gas in the urinary bladder, raising the possibility of gas-forming infection if not explained by recent catheterization. 4. No active gastrointestinal bleeding on CTA. 5. Hepatic steatosis with stable hepatic enhancement. Electronically signed by: Helayne Hurst MD 01/28/2024 11:14 AM EST RP Workstation: [id number]   US  Paracentesis Result Date: 01/27/2024 INDICATION: [id number] Abdominal ascites [id number] 63 year old female admitted with severe steatohepatittis, AKI, with 1 week history of abdominal pain, nausea and vomiting. Imaging noting mild ascites. IR was requested for diagnostic and therapeutic paracentesis, to rule out possible SBP. EXAM: ULTRASOUND GUIDED DIAGNOSTIC AND THERAPEUTIC PARACENTESIS MEDICATIONS: 5 mL of 1% lidocaine . COMPLICATIONS: None immediate. PROCEDURE: Informed written consent was obtained from the patient after a discussion of the risks, benefits and alternatives to treatment. A timeout was performed prior to the initiation of the procedure. Initial ultrasound scanning demonstrates a large amount of ascites within the right lower abdominal quadrant. The right lower abdomen was prepped and draped in the usual sterile fashion. 1% lidocaine  was used for local anesthesia. Following this, a 19 gauge, 7-cm, Yueh catheter was introduced. An ultrasound image was saved for documentation purposes. The paracentesis was performed. The catheter was removed and a dressing was applied. The patient tolerated the procedure well without immediate post procedural complication. FINDINGS: A total of approximately 1.6 L of clear, straw-colored peritoneal fluid was removed. Samples were sent to the laboratory as requested by the clinical team. IMPRESSION: Successful  ultrasound-guided paracentesis yielding 1.6 L of peritoneal fluid. Procedure performed by: Carlin Griffon, PA-C PLAN: If the patient eventually requires >/=2 paracenteses in  a 30 day period, candidacy for formal evaluation by the [facility] will be assessed. Thom Hall, MD [facility] Electronically Signed   By: Thom Hall M.D.   On: 01/27/2024 16:57        Scheduled Meds:  sodium chloride    Intravenous Once   Chlorhexidine  Gluconate Cloth  6 each Topical Daily   feeding supplement  1 Container Oral TID BM   [START ON 01/30/2024] feeding supplement (PROSource TF20)  60 mL Per Tube Daily   folic acid   1 mg Oral Daily   Gerhardt's butt cream   Topical BID   lactulose   30 g Oral BID   Or   lactulose   300 mL Rectal BID   midodrine   10 mg Oral TID WC   multivitamin with minerals  1 tablet Oral Daily   nystatin    Topical BID   pantoprazole  (PROTONIX ) IV  40 mg Intravenous Q12H   sodium chloride  flush  10-40 mL Intracatheter Q12H   Vitamin D  (Ergocalciferol )  50,000 Units Oral Q7 days   Continuous Infusions:  cefTRIAXone  (ROCEPHIN )  IV Stopped (01/28/24 1914)   dextrose  5 % and 0.9 % NaCl 75 mL/hr at 01/29/24 1115   feeding supplement (OSMOLITE 1.5 CAL)     thiamine  (VITAMIN B1) injection       LOS: 7 days     Calvin KATHEE Robson, MD [facility]   If 7PM-7AM, please contact night-coverage  01/29/2024, 1:56 PM

## 2024-01-29 NOTE — Progress Notes (Signed)
*  PRELIMINARY RESULTS* Echocardiogram 2D Echocardiogram has been performed.  Claire Brown Claire Brown 01/29/2024, 8:48 AM

## 2024-01-29 NOTE — Progress Notes (Signed)
 Physical Therapy Treatment Patient Details Name: Claire Brown MRN: [id number] DOB: [birth year] Today's Date: 01/29/2024   History of Present Illness Pt is a 63 y/o F admitted on 01/22/24 after presenting with c/o weakness, N&V, diarrhea, RUQ pain. Pt is being treated for steatohepatitis, AKI, hypoglycemia, & electrolyte derangement. PMH: bipolar mood disorder, alcohol & tobacco use disorder    PT Comments  Pt generally alert but repeating phrases today that are not responses to questions asked yes and I passed out.  She allows minimal supine AA/PROM.  Mobility deferred due to resistance to ex and not participating in care.   If plan is discharge home, recommend the following: Two people to help with walking and/or transfers;Two people to help with bathing/dressing/bathroom   Can travel by private vehicle        Equipment Recommendations  Rolling walker (2 wheels);BSC/3in1    Recommendations for Other Services       Precautions / Restrictions Precautions Precautions: Fall Recall of Precautions/Restrictions: Impaired Precaution/Restrictions Comments: hypotensive Restrictions Weight Bearing Restrictions Per Provider Order: No     Mobility  Bed Mobility               General bed mobility comments: deferred.  makes no attempt to assist Patient Response: Flat affect, Cooperative  Transfers                        Ambulation/Gait                   Stairs             Wheelchair Mobility     Tilt Bed Tilt Bed Patient Response: Flat affect, Cooperative  Modified Rankin (Stroke Patients Only)       Balance                                            Communication Communication Communication: Impaired Factors Affecting Communication: Difficulty expressing self  Cognition Arousal: Alert Behavior During Therapy: WFL for tasks assessed/performed   PT - Cognitive impairments: Difficult to assess                        PT - Cognition Comments: awake but repeats  yes and I passed out frequenlty during session Following commands: Impaired Following commands impaired: Follows one step commands inconsistently    Cueing Cueing Techniques: Verbal cues, Tactile cues  Exercises Other Exercises Other Exercises: BLE AA/PROM    General Comments        Pertinent Vitals/Pain Pain Assessment Pain Assessment: Faces Faces Pain Scale: Hurts little more Pain Location: generalized grimmacing with ROM Pain Descriptors / Indicators: Grimacing, Discomfort Pain Intervention(s): Limited activity within patient's tolerance, Monitored during session, Repositioned    Home Living                          Prior Function            PT Goals (current goals can now be found in the care plan section) Progress towards PT goals: Not progressing toward goals - comment    Frequency    Min 2X/week      PT Plan      Co-evaluation              AM-PAC PT 6 Clicks Mobility  Outcome Measure  Help needed turning from your back to your side while in a flat bed without using bedrails?: A Lot Help needed moving from lying on your back to sitting on the side of a flat bed without using bedrails?: A Lot Help needed moving to and from a bed to a chair (including a wheelchair)?: Total Help needed standing up from a chair using your arms (e.g., wheelchair or bedside chair)?: Total Help needed to walk in hospital room?: Total Help needed climbing 3-5 steps with a railing? : Total 6 Click Score: 8    End of Session   Activity Tolerance: Patient limited by lethargy Patient left: in bed;with call bell/phone within reach;with bed alarm set Nurse Communication: Mobility status PT Visit Diagnosis: Muscle weakness (generalized) (M62.81);Difficulty in walking, not elsewhere classified (R26.2);History of falling (Z91.81)     Time: 8841-8792 PT Time Calculation (min) (ACUTE ONLY): 9  min  Charges:    $Therapeutic Exercise: 8-22 mins PT General Charges $$ ACUTE PT VISIT: 1 Visit                   Lauraine Gills, PTA 01/29/2024, 12:30 PM

## 2024-01-29 NOTE — Progress Notes (Signed)
 Daily Progress Note   Patient Name: Claire Brown       Date: 01/29/2024 DOB: [birth year]  Age: 63 y.o. MRN#: [id number] Attending Physician: Jhonny Calvin KATHEE, MD Primary Care Physician: Pcp, No Admit Date: 01/22/2024  Reason for Consultation/Follow-up: Establishing goals of care  HPI/Brief Hospital Review:  63 y.o. female  with past medical history of bipolar disorder, cirrhosis and ETOH and tobacco use admitted from home on 01/22/2024 with complaints of N/V/D and associated poor PO intake x 1 week.   In ED found to have significantly elevated liver enzymes--now normalized, elevated bilirubin and INR Found to have severe steatohepatitis, GI consulted and following close Anemic-requiring blood transfusions Encephalopathic--CIWA protocol   Palliative medicine was consulted for assisting with goals of care conversations.  Subjective: Extensive chart review has been completed prior to meeting patient including labs, vital signs, imaging, progress notes, orders, and available advanced directive documents from current and previous encounters.    Visited with Ms. Boehringer at her bedside. She is resting in bed with eyes closed she does not acknowledge my presence in room or respond to calling of her name. Nursing staff at bedside, no recent changes in mentation. No family or visitors at bedside during initial visit.  Returned to bedside when significant other-Chris Ross and his mother visiting. [name] confirms he and Ms. Fake have been together for over 11 years. She has been married on several occasions--divorced and widowed. Together she and [name] do not have any biological children. [name] share he is only aware of one son-Colt (unaware of last name). [name] is able to go through Ms.  Billy's cell phone and provided Colt's number [phone]) same as listed under [name] Ada in inactive contacts. [name] shares in the 11 years he and Ms. Bridgers have been together, [name] has never been to see Ms. Spurlock. [name] shares he believes they talk on the phone every several months, no recent contact. Explained Micanopy Laws regarding NOK to Chris--will need to attempt to locate and connect with [name] [name] verbalizes understanding.  Briefly discussed hospital course and most recent medical updates with [name]. We also discussed concern for overall poor prognosis--high MELD score with 50% mortality rate in 3 months.  [name] shares PTA admission and getting sick, Ms. Gowans functioned independently. He speaks to her recent history of heavy ETOH  consumption. [name] shares just recent she had cut back to a few bottles of wine a week.  Call placed to [facility] provided by [name] and inactive contact. Call went straight to VM--HIPAA complaint VM left.  Answered and addressed all questions and concerns. PMT to continue to follow for ongoing needs and support.  Objective:  Physical Exam Constitutional:      General: She is not in acute distress.    Appearance: She is ill-appearing.  HENT:     Head: Normocephalic.     Mouth/Throat:     Mouth: Mucous membranes are dry.  Pulmonary:     Effort: Pulmonary effort is normal. No respiratory distress.  Abdominal:     General: There is no distension.  Skin:    General: Skin is warm and dry.     Coloration: Skin is jaundiced.     Findings: Bruising present.  Neurological:     Mental Status: She is lethargic.     Motor: Weakness present.             Vital Signs: BP 120/82 (BP Location: Right Leg)   Pulse (!) 110   Temp 97.7 F (36.5 C)   Resp 18   Ht 5' (1.524 m)   Wt 60.3 kg   SpO2 96%   BMI 25.96 kg/m  SpO2: SpO2: 96 % O2 Device: O2 Device: Nasal Cannula O2 Flow Rate: O2 Flow Rate (L/min): 1 L/min   Palliative Care  Assessment & Plan   Assessment/Recommendation/Plan  Establishment of NOK--attempted call to [employer] (listed in inactive contacts) Poor prognosis  Care plan was discussed with primary team  Thank you for allowing the Palliative Medicine Team to assist in the care of this patient.  Visit includes: Detailed review of medical records (labs, imaging, vital signs), medically appropriate exam (mental status, respiratory, cardiac, skin), discussed with treatment team, counseling and educating patient, family and staff, documenting clinical information, medication management and coordination of care.  Waddell Lesches, DNP, AGNP-C Palliative Medicine   Please contact Palliative Medicine Team phone at [phone] for questions and concerns.

## 2024-01-29 NOTE — Progress Notes (Signed)
 GI Inpatient Follow-up Note  Subjective:  Patient seen and somewhat somnolent.   Scheduled Inpatient Medications:   sodium chloride    Intravenous Once   Chlorhexidine  Gluconate Cloth  6 each Topical Daily   feeding supplement  1 Container Oral TID BM   [START ON 01/30/2024] feeding supplement (PROSource TF20)  60 mL Per Tube Daily   folic acid   1 mg Oral Daily   Gerhardt's butt cream   Topical BID   lactulose   30 g Oral BID   Or   lactulose   300 mL Rectal BID   midodrine   10 mg Oral TID WC   multivitamin with minerals  1 tablet Oral Daily   nystatin    Topical BID   pantoprazole  (PROTONIX ) IV  40 mg Intravenous Q12H   sodium chloride  flush  10-40 mL Intracatheter Q12H   Vitamin D  (Ergocalciferol )  50,000 Units Oral Q7 days    Continuous Inpatient Infusions:    cefTRIAXone  (ROCEPHIN )  IV Stopped (01/28/24 1914)   dextrose  5 % and 0.9 % NaCl     feeding supplement (OSMOLITE 1.5 CAL)     thiamine  (VITAMIN B1) injection      PRN Inpatient Medications:  dextrose , prochlorperazine , sodium chloride  flush  Review of Systems:  Unable to assess due to mental status   Physical Examination: BP 115/79 (BP Location: Right Leg)   Pulse (!) 104   Temp 97.7 F (36.5 C)   Resp 16   Ht 5' (1.524 m)   Wt 60.3 kg   SpO2 95%   BMI 25.96 kg/m  Gen: NAD HEENT: scleral icterus Neck: supple, no JVD or thyromegaly Chest: No respiratory distress Abd: soft, non-tender, non-distended Ext: no edema, well perfused with 2+ pulses, Skin: no rash or lesions noted Lymph: no LAD  Data: Lab Results  Component Value Date   WBC 9.5 01/29/2024   HGB 10.2 (L) 01/29/2024   HCT 28.5 (L) 01/29/2024   MCV 99.3 01/29/2024   PLT 14 (LL) 01/29/2024   Recent Labs  Lab 01/28/24 0857 01/28/24 1845 01/29/24 0528  HGB 7.3* 10.6* 10.2*   Lab Results  Component Value Date   NA 140 01/29/2024   K 3.6 01/29/2024   CL 109 01/29/2024   CO2 20 (L) 01/29/2024   BUN 6 (L) 01/29/2024   CREATININE  0.71 01/29/2024   Lab Results  Component Value Date   ALT 46 (H) 01/29/2024   AST 46 (H) 01/29/2024   ALKPHOS 161 (H) 01/29/2024   BILITOT 10.2 (H) 01/29/2024   Recent Labs  Lab 01/29/24 0559  INR 3.2*   Assessment/Plan: Claire Brown is a 63 y.o. lady with chronic alcohol use who presented with nausea/vomiting and abnormal liver enzymes in a pattern consistent with alcoholic hepatitis. MELD 3.0 score of 31. Will need supportive care.   Recommendations:  - IV fluids - nutrition, agree with NG tube - instead of lactulose , would likely use miralax - daily CMP and INR - agree with palliative care assessment - she is not a transplant candidate for any of our local institutions - could consider n acetylcysteine and steroids but would off for now - avoid sedating medicines - avoid hepatotoxic medicines - monitor for withdrawal - IV thiamine  - transfuse to keep hemoglobin > 7 - no overt GI bleeding so no plans for any endoscopy   Poor prognosis, will continue to follow. Please call with any questions or concerns. Dr. Jinny will resume care tomorrow   Frederic Schick MD, MPH [facility]

## 2024-01-29 NOTE — Progress Notes (Signed)
 SLP Cancellation Note  Patient Details Name: Claire Brown MRN: [id number] DOB: [birth year]   Cancelled treatment:       Reason Eval/Treat Not Completed: Other (comment)  Pt currently receiving personal care. Will re-attempt as able.   Eleonor Ocon B. Rubbie, M.S., CCC-SLP, CBIS Speech-Language Pathologist Certified Brain Injury Specialist [facility] [phone] Ascom [phone] Fax [phone]  Kyisha Fowle Rubbie 01/29/2024, 10:20 AM

## 2024-01-29 NOTE — Progress Notes (Signed)
 Nutrition Follow-up  DOCUMENTATION CODES:   Non-severe (moderate) malnutrition in context of chronic illness  INTERVENTION:   Monitor magnesium , potassium, and phosphorus daily for at least 3 days, MD to replete as needed, as pt is at risk for refeeding syndrome. -Continue Thiamine  daily  Once small bore NGT placed and able to be used: -Initiate Osmolite 1.5 @ 20 ml/hr and increase by 10 ml every 12 hours to goal rate of 50 ml/hr.  -60 ml Prosource TF 20 daily -Provides 1880 kcal, 75 grams of protein, and 914 ml of H2O.    -Transition vitamin supplementation to per tube as able  -Boost Breeze po TID, each supplement provides 250 kcal and 9 grams of protein as desired  NUTRITION DIAGNOSIS:   Moderate Malnutrition related to chronic illness (Steatohepatitis) as evidenced by mild fat depletion, moderate fat depletion, mild muscle depletion, moderate muscle depletion.  Ongoing.  GOAL:   Patient will meet greater than or equal to 90% of their needs  Not meeting currently.  MONITOR:   PO intake, Supplement acceptance  REASON FOR ASSESSMENT:   Consult Enteral/tube feeding initiation and management  ASSESSMENT:   63 y/o female with h/o etoh abusem ovarian mass, anxiety, bipolar disorder and hiatal hernia who is admitted with steatohepatitis and new cirrhosis.  12/7 admitted 12/12- s/p BSE- downgraded to dysphagia 1 diet with thin liquids for ease of intake   RD consulted to start tube feeds. Per RN, small bore NGT was unable to be placed at bedside yesterday. Plan is for placement in IR. Will go ahead and place orders for when able to start. Pt on dysphagia 1 diet but RD reports pt is lethargic and not motivated to eat.   Admission weight: 112 lbs Current weight: 132 lbs  Medications: Folic acid , Lactulose , Multivitamin with minerals daily, Vitamin D , D5 infusion, IV Thiamine   Labs reviewed: CBGs: 101-108   Diet Order:   Diet Order             DIET - DYS 1 Room  service appropriate? Yes; Fluid consistency: Thin  Diet effective now                   EDUCATION NEEDS:   Education needs have been addressed  Skin:  Skin Assessment: Skin Integrity Issues: Skin Integrity Issues:: Other (Comment) Other: irritant contact dermatitis to vagina  Last BM:  12/13  Height:   Ht Readings from Last 1 Encounters:  01/22/24 5' (1.524 m)    Weight:   Wt Readings from Last 1 Encounters:  01/29/24 60.3 kg    Ideal Body Weight:  45.5 kg  BMI:  Body mass index is 25.96 kg/m.  Estimated Nutritional Needs:   Kcal:  1400-1600kcal/day  Protein:  70-80g/day  Fluid:  1.4-1.6L/day  Morna Lee, MS, RD, LDN Inpatient Clinical Dietitian Contact via Secure chat

## 2024-01-30 ENCOUNTER — Inpatient Hospital Stay

## 2024-01-30 ENCOUNTER — Other Ambulatory Visit: Payer: Self-pay | Admitting: Radiology

## 2024-01-30 ENCOUNTER — Other Ambulatory Visit (HOSPITAL_COMMUNITY): Payer: Self-pay

## 2024-01-30 DIAGNOSIS — K7030 Alcoholic cirrhosis of liver without ascites: Secondary | ICD-10-CM | POA: Diagnosis not present

## 2024-01-30 DIAGNOSIS — D649 Anemia, unspecified: Secondary | ICD-10-CM

## 2024-01-30 DIAGNOSIS — R739 Hyperglycemia, unspecified: Secondary | ICD-10-CM | POA: Diagnosis not present

## 2024-01-30 DIAGNOSIS — E876 Hypokalemia: Secondary | ICD-10-CM | POA: Diagnosis not present

## 2024-01-30 DIAGNOSIS — K7581 Nonalcoholic steatohepatitis (NASH): Secondary | ICD-10-CM | POA: Diagnosis not present

## 2024-01-30 DIAGNOSIS — I959 Hypotension, unspecified: Secondary | ICD-10-CM | POA: Diagnosis not present

## 2024-01-30 DIAGNOSIS — D696 Thrombocytopenia, unspecified: Secondary | ICD-10-CM | POA: Diagnosis not present

## 2024-01-30 LAB — COMPREHENSIVE METABOLIC PANEL WITH GFR
ALT: 37 U/L (ref 0–44)
AST: 37 U/L (ref 15–41)
Albumin: 2.7 g/dL — ABNORMAL LOW (ref 3.5–5.0)
Alkaline Phosphatase: 160 U/L — ABNORMAL HIGH (ref 38–126)
Anion gap: 11 (ref 5–15)
BUN: 7 mg/dL — ABNORMAL LOW (ref 8–23)
CO2: 17 mmol/L — ABNORMAL LOW (ref 22–32)
Calcium: 8.6 mg/dL — ABNORMAL LOW (ref 8.9–10.3)
Chloride: 115 mmol/L — ABNORMAL HIGH (ref 98–111)
Creatinine, Ser: 0.91 mg/dL (ref 0.44–1.00)
GFR, Estimated: >60 mL/min (ref >60)
Glucose, Bld: 123 mg/dL — ABNORMAL HIGH (ref 70–99)
Potassium: 3.1 mmol/L — ABNORMAL LOW (ref 3.5–5.1)
Sodium: 143 mmol/L (ref 135–145)
Total Bilirubin: 12.7 mg/dL — ABNORMAL HIGH (ref 0.0–1.2)
Total Protein: 4.1 g/dL — ABNORMAL LOW (ref 6.5–8.1)

## 2024-01-30 LAB — PHOSPHORUS: Phosphorus: 2.8 mg/dL (ref 2.5–4.6)

## 2024-01-30 LAB — URINALYSIS, ROUTINE W REFLEX MICROSCOPIC
Glucose, UA: NEGATIVE mg/dL
Ketones, ur: NEGATIVE mg/dL
Leukocytes,Ua: NEGATIVE
Nitrite: NEGATIVE
Protein, ur: 100 mg/dL — AB
Specific Gravity, Urine: >1.046 — ABNORMAL HIGH (ref 1.005–1.030)
pH: 5 (ref 5.0–8.0)

## 2024-01-30 LAB — GLUCOSE, CAPILLARY
Glucose-Capillary: 107 mg/dL — ABNORMAL HIGH (ref 70–99)
Glucose-Capillary: 110 mg/dL — ABNORMAL HIGH (ref 70–99)
Glucose-Capillary: 79 mg/dL (ref 70–99)
Glucose-Capillary: 92 mg/dL (ref 70–99)
Glucose-Capillary: 93 mg/dL (ref 70–99)
Glucose-Capillary: 98 mg/dL (ref 70–99)

## 2024-01-30 LAB — CBC
HCT: 28.2 % — ABNORMAL LOW (ref 36.0–46.0)
Hemoglobin: 10.3 g/dL — ABNORMAL LOW (ref 12.0–15.0)
MCH: 35.9 pg — ABNORMAL HIGH (ref 26.0–34.0)
MCHC: 36.5 g/dL — ABNORMAL HIGH (ref 30.0–36.0)
MCV: 98.3 fL (ref 80.0–100.0)
Platelets: 32 K/uL — ABNORMAL LOW (ref 150–400)
RBC: 2.87 MIL/uL — ABNORMAL LOW (ref 3.87–5.11)
RDW: 25.1 % — ABNORMAL HIGH (ref 11.5–15.5)
WBC: 10.1 K/uL (ref 4.0–10.5)
nRBC: 0 % (ref 0.0–0.2)

## 2024-01-30 LAB — BLOOD GAS, VENOUS
Acid-base deficit: 6 mmol/L — ABNORMAL HIGH (ref 0.0–2.0)
Bicarbonate: 16.9 mmol/L — ABNORMAL LOW (ref 20.0–28.0)
O2 Content: 1 L/min
O2 Saturation: 98.9 %
Patient temperature: 37
pCO2, Ven: 26 mmHg — ABNORMAL LOW (ref 44–60)
pH, Ven: 7.42 (ref 7.25–7.43)
pO2, Ven: 87 mmHg — ABNORMAL HIGH (ref 32–45)

## 2024-01-30 LAB — BPAM PLATELET PHERESIS
Blood Product Expiration Date: 202512152359
ISSUE DATE / TIME: 202512141221
Unit Type and Rh: 7300

## 2024-01-30 LAB — AMMONIA: Ammonia: 104 umol/L — ABNORMAL HIGH (ref 9–35)

## 2024-01-30 LAB — MAGNESIUM: Magnesium: 1.7 mg/dL (ref 1.7–2.4)

## 2024-01-30 LAB — PREPARE PLATELET PHERESIS: Unit division: 0

## 2024-01-30 LAB — LACTIC ACID, PLASMA: Lactic Acid, Venous: 3.3 mmol/L (ref 0.5–1.9)

## 2024-01-30 MED ORDER — LACTULOSE 10 GM/15ML PO SOLN: 30.0000 g | Freq: Two times a day (BID) | ORAL | Status: DC

## 2024-01-30 MED ORDER — IPRATROPIUM-ALBUTEROL 0.5-2.5 (3) MG/3ML IN SOLN
3.0000 mL | Freq: Once | RESPIRATORY_TRACT | Status: AC
  Administered 2024-01-30: 22:00:00 3 mL via RESPIRATORY_TRACT
  Filled 2024-01-30: qty 3

## 2024-01-30 MED ORDER — SODIUM BICARBONATE 8.4 % IV SOLN
50.0000 meq | Freq: Once | INTRAVENOUS | Status: AC
  Administered 2024-01-31: 01:00:00 50 meq via INTRAVENOUS
  Filled 2024-01-30: qty 50

## 2024-01-30 MED ORDER — LACTULOSE ENEMA
300.0000 mL | Freq: Two times a day (BID) | ORAL | Status: DC
  Filled 2024-01-30: qty 300

## 2024-01-30 MED ORDER — SODIUM BICARBONATE 8.4 % IV SOLN
50.0000 meq | Freq: Once | INTRAVENOUS | Status: AC
  Administered 2024-01-30: 23:00:00 50 meq via INTRAVENOUS
  Filled 2024-01-30: qty 50

## 2024-01-30 MED ORDER — SODIUM CHLORIDE 0.9 % IV SOLN
100.0000 mg | Freq: Two times a day (BID) | INTRAVENOUS | Status: DC
  Administered 2024-01-30 – 2024-02-03 (×8): 100 mg via INTRAVENOUS
  Filled 2024-01-30 (×9): qty 100

## 2024-01-30 MED ORDER — OSMOLITE 1.2 CAL PO LIQD: 1000.0000 mL | ORAL | Status: DC

## 2024-01-30 MED ORDER — RIFAXIMIN 550 MG PO TABS
550.0000 mg | ORAL_TABLET | Freq: Two times a day (BID) | ORAL | Status: DC
  Administered 2024-01-30 – 2024-02-03 (×8): 550 mg
  Filled 2024-01-30 (×9): qty 1

## 2024-01-30 MED ORDER — ADULT MULTIVITAMIN W/MINERALS CH
1.0000 | ORAL_TABLET | Freq: Every day | ORAL | Status: DC
  Administered 2024-01-30 – 2024-02-03 (×5): 1
  Filled 2024-01-30 (×5): qty 1

## 2024-01-30 MED ORDER — SODIUM CHLORIDE 0.9 % IV SOLN
3.0000 g | Freq: Four times a day (QID) | INTRAVENOUS | Status: DC
  Administered 2024-01-30 – 2024-02-02 (×11): 3 g via INTRAVENOUS
  Filled 2024-01-30 (×13): qty 8

## 2024-01-30 MED ORDER — FREE WATER
30.0000 mL | Status: DC
  Administered 2024-01-30 – 2024-02-03 (×23): 30 mL

## 2024-01-30 MED ORDER — FOLIC ACID 1 MG PO TABS
1.0000 mg | ORAL_TABLET | Freq: Every day | ORAL | Status: DC
  Administered 2024-01-31 – 2024-02-03 (×4): 1 mg
  Filled 2024-01-30 (×4): qty 1

## 2024-01-30 NOTE — Significant Event (Signed)
 Rapid Response Event Note   Reason for Call :  Altered mental status. Per nursing staff, last night patient was alert and able to follow some commands but confused. Tonight, nonverbal, not responding to sternal rub.  Initial Focused Assessment:  Rapid response RN arrived in patient's room with patient surrounded by 2A staff. Shortly followed by on call medical team, Dr. Lawence and Laneta Boston NP. Patient with eyes open but no interaction with staff. Withdraws to painful stimulation. Vital signs with BP 107/68 MAP 80, RR 32 shallow but unlabored, oxygen saturations 96-100% on 1L nasal cannula, HR irregular 108-112. Lungs clear on left, coarse on right, diminished at both bases. CBG at 19:48 107.  Interventions:  Escorted patient to CT and back to room.   Plan of Care:  Patient to remain on 2A for now. Medical team reviewing orders and making changes as needed, including ordering the head CT, a chest x-ray, and labs. 2A staff to reach back out to rapid response team if any further needs.  Event Summary:   MD Notified: Dr. Lawence and Natalie Donati-Garmon NP Call Time: 20:18 Arrival Time: 20:20 End Time: 20:50  GEORGINA LAURAINE NORRIS, RN

## 2024-01-30 NOTE — TOC Progression Note (Signed)
 Transition of Care [facility]) - Progression Note    Patient Details  Name: TASHIBA TIMONEY MRN: [id number] Date of Birth: February 25, 1960  [facility]) CM/SW Contact  Shasta DELENA Daring, RN Phone Number: 01/30/2024, 10:34 AM  Clinical Narrative:    RNCM attenpted to assess patient. Patient is currently unable to participate in conversation regarding skilled care placement. She is lying in semi-fowler's position with her eyes closed. Would not open eyes when requested to do so or with questions. Only response was, :ok.  Will return at later time.   Expected Discharge Plan: Skilled Nursing Facility Barriers to Discharge: Continued Medical Work up               Expected Discharge Plan and Services       Living arrangements for the past 2 months: Single Family Home                                       Social Drivers of Health (SDOH) Interventions SDOH Screenings   Food Insecurity: No Food Insecurity (01/23/2024)  Housing: Low Risk (01/23/2024)  Transportation Needs: No Transportation Needs (01/23/2024)  Utilities: Not At Risk (01/23/2024)  Social Connections: Socially Isolated (07/13/2023)  Tobacco Use: High Risk (01/22/2024)    Readmission Risk Interventions     No data to display

## 2024-01-30 NOTE — Progress Notes (Signed)
 OT Cancellation Note  Patient Details Name: Claire Brown MRN: [id number] DOB: 1960-09-20   Cancelled Treatment:    Reason Eval/Treat Not Completed: Patient at procedure or test/ unavailable (OT continues to follow pt for therapy services. Pt currently OTF. Will hold at this time and re-attempt at a later date/time as avialable and pt medically appropriate for therapy services.)   Jhonny Pelton, M.S., OTR/L 01/30/2024, 10:11 AM ]

## 2024-01-30 NOTE — Progress Notes (Addendum)
 Overnight   NAME: Claire Brown MRN: [id number] DOB : [birth year]    Date of Service   01/30/2024   HPI/Events of Note   HPI: 63 year old female with past medical history of bipolar disorder, cirrhosis and EtOH and tobacco use admitted from home on 01/22/2024 with complaints of nausea vomiting diarrhea.  Associated prior p.o. intake for 1 week.  In emergency department patient found to have significantly elevated liver enzymes, which have now normalized, elevated bilirubin and INR patient was found to have severe steatohepatitis, GI consulted and following closely anemic requiring blood transfusions.  Encephalopathic on CIWA protocol.  Overnight: Rapid response called due to patient's altered mental status.  RN reports patient was responsive to voice on her shift last night 01/29/2024 and on assessment tonight patient presents differently. Pt has been receiving tube feeds and is now obtunded with tachypnea and is febrile.   Physical Exam Constitutional:      Appearance: She is toxic-appearing.  HENT:     Mouth/Throat:     Mouth: Mucous membranes are dry.  Eyes:     General: Scleral icterus present.  Cardiovascular:     Rate and Rhythm: Tachycardia present.  Pulmonary:     Effort: Tachypnea present.     Breath sounds: Examination of the right-lower field reveals decreased breath sounds. Examination of the left-lower field reveals decreased breath sounds. Decreased breath sounds present.  Musculoskeletal:     Right lower leg: 3+ Pitting Edema present.     Left lower leg: 3+ Pitting Edema present.  Skin:    General: Skin is moist.     Capillary Refill: Capillary refill takes 2 to 3 seconds.     Coloration: Skin is jaundiced.     Comments: Weeping LUE  Neurological:     Mental Status: She is unresponsive.     GCS: GCS eye subscore is 1. GCS verbal subscore is 1. GCS motor subscore is 5.       Interventions/ Plan   CT head - rule out neurological cause for AMS VBG,  and ammonia- check metabolic status Lactic, UA, Cxray - rule out infectious etiology  Duoneb- once for tachypnea        Update 2146:   Ct head neg:  IMPRESSION: 1. No acute intracranial abnormality. 2. Atrophy and chronic small vessel ischemic changes.  Cxray concerning for aspiration Pneumonia va CAP  IMPRESSION: 1. Small bilateral pleural effusions and mild pulmonary edema. 2. Left basilar airspace opacity, which may reflect atelectasis or superimposed infection. Broaden antibiotic stewardship  to unysan and doxycycline    Lactic acidosis   lactate of 3.8- plan to redraw in 2 hrs.   Hypobicarbonatemia  Low Bicarb 16.9 on VBG- 2 amps of bicarb over 4 hrs   0000:   Patient still obtunded, tachycardic, and tachypneic.  Escalated care to stepdown unit.  20 mg IV lasix  for tachypnea and Cxray showing pulmonary edema.  Ammonia elevated at 104-ordered one time dose of lactulose  Potassium 3.1, replaced with 4 runs of IV potassium Mag 1.8, replaced with 2 g IV mag.  0200 Repeat lactic acid 3.8.  0300 On assessment patient more responsive GCS 4-1-3.  CBG 64- rn reported hypoglycemia protocol started and gave verbal to restart tube feeds.     0500- pt with worsening hypotension, advised RN to Admin midodrine  early, start levo Gtt, lactic acid critical 5. Consult Icu.  Leianne Callins Donati- Aram BSN RN CCRN AGACNP-BC Acute Care Nurse Practitioner [facility]

## 2024-01-30 NOTE — Progress Notes (Addendum)
 Palliative Care Progress Note, Assessment & Plan   Patient Name: Claire Brown       Date: 01/30/2024 DOB: 1960/06/10  Age: 63 y.o. MRN#: [id number] Attending Physician: Jhonny Calvin KATHEE, MD Primary Care Physician: Pcp, No Admit Date: 01/22/2024  Subjective: Patient is sitting up in bed, sleeping.  She awakens to my presence but does not make eye contact.  She makes no vocalizations during my visit despite multiple efforts to have her engage in discussions.  NG tube is in place in right nare.  No family or friends present at bedside during my visit.  HPI: 63 y.o. female  with past medical history of bipolar disorder, cirrhosis and ETOH and tobacco use admitted from home on 01/22/2024 with complaints of N/V/D and associated poor PO intake x 1 week.   In ED found to have significantly elevated liver enzymes--now normalized, elevated bilirubin and INR Found to have severe steatohepatitis, GI consulted and following close Anemic-requiring blood transfusions Encephalopathic--CIWA protocol   Palliative medicine was consulted for assisting with goals of care conversations.  Summary of counseling/coordination of care: Extensive chart review completed prior to meeting patient including labs, vital signs, imaging, progress notes, orders, and available advanced directive documents from current and previous encounters.   After reviewing the patient's chart and assessing the patient at bedside, I am unable to complete a symptom assessment or goals of care discussion with patient at this time.  After visiting with the patient, I spoke with her significant other Claire Brown over the phone.  Brief medical update given.  Discussed NG tube has been placed for tube feeds and medication administration.  Plan remains for  watchful waiting over the next 24 hours to see how much her encephalopathy can be reversed.  Discussed that her ongoing use of alcohol will not be reversed over the next 24 hours but that hopefully some of her mental fogginess and confusion can be addressed.    He asks if the medical team has heard from patient's son called.  No response to palliative medicine team from [place] to date.  Discussed that attempts will continue to be made to get in touch with patient's son Claire Brown.  However, if patient's son remains reasonably unavailable, the next of kin decision maker would call to Claire Brown-an individual who has an established relationship with the patient, who is acting in good faith on behalf of the patient, and who can reliably convey the patient's wishes.  Bari shares he does have her best interest at heart.  He is hopeful that she will get better.  He was grateful for the update.  He plans to visit today but is unable to get to the hospital.  He plans to do bedside tomorrow.  I advised Claire Brown to reach out to PMT or nursing staff when at bedside tomorrow.  I plan to continue goals of care discussion in person with patient and Claire Brown whenever he arrives bedside tomorrow.  However, still awaiting return phone call from patient's son as he is technically next of kin. Therefore, no change to plan of care at this time.  PMT will continue to follow and support.    Physical Exam Vitals reviewed.  Constitutional:  General: She is not in acute distress.    Appearance: She is normal weight.  HENT:     Head: Normocephalic.     Nose:     Comments: NG to right nare    Mouth/Throat:     Mouth: Mucous membranes are moist.  Cardiovascular:     Rate and Rhythm: Normal rate.  Pulmonary:     Effort: Pulmonary effort is normal.  Abdominal:     Palpations: Abdomen is soft.  Skin:    Coloration: Skin is jaundiced.  Psychiatric:        Behavior: Behavior normal.             35 minute visit includes:  Detailed review of medical records (labs, imaging, vital signs), medically appropriate exam (mental status, respiratory, cardiac, skin), discussed with treatment team, counseling and educating patient, family and staff, documenting clinical information, medication management and coordination of care.  Lamarr L. Arvid, DNP, FNP-BC Palliative Medicine Team

## 2024-01-30 NOTE — Progress Notes (Addendum)
 Nutrition Follow-up  DOCUMENTATION CODES:   Non-severe (moderate) malnutrition in context of chronic illness  INTERVENTION:   -Continue NPO -D/c Boost Breeze po TID, each supplement provides 250 kcal and 9 grams of protein  -Continue MVI with minerals daily -Continue 100 mg thiamine  daily -Continue 1 mg folic acid  daily  -Continue 500 mg vitamin C  BID -Continue daily weights -Patient remains at high refeeding risk; recommend monitor potassium, magnesium  and phosphorus labs daily until stable  -TF via NGT:     Initiate Osmolite 1.2 @ 25 ml/hr and increase by 10 ml every 12 hours to goal rate of 55 ml/hr.   30 ml free water  flush every 4 hours   Tube feeding regimen provides 1584 kcal (100% of needs), 73 grams of protein, and 1065 ml of H2O.  Total free water : 1245 ml daily  NUTRITION DIAGNOSIS:   Moderate Malnutrition related to chronic illness (Steatohepatitis) as evidenced by mild fat depletion, moderate fat depletion, mild muscle depletion, moderate muscle depletion.  Ongoing  GOAL:   Patient will meet greater than or equal to 90% of their needs  Progressing   MONITOR:   Diet advancement, TF tolerance  REASON FOR ASSESSMENT:   Consult Enteral/tube feeding initiation and management  ASSESSMENT:   63 y/o female with h/o etoh abusem ovarian mass, anxiety, bipolar disorder and hiatal hernia who is admitted with steatohepatitis and new cirrhosis.  12/12- s/p BSE- downgraded to dysphagia 1 diet with thin liquids for ease of intake, s/p right paracentesis- 1.6 L removed  12/13- s/p BSE- NPO 12/15- s/p NGT placement via flouro  Reviewed I/O's: +581 ml x 24 hours and +13.1 L since admission  UOP: 500 ml x 24 hours    Per GI notes, partner reports patient drinks daily PTA.   Per RN, NGT placement was attempted on 01/29/24, but unsuccessful. Patient underwent NGT placement via fluoroscopy.   Patient lying in bed at time of visit. No family present. Patient did not  arouse to voice.   Reviewed weight history. Weight has ranged from 51.1-60.5 kg within the past 7 days.   Palliative care following for goals of care discussions; plan to continue goals of care.   Medications reviewed and include folic acid , lactulose , midodrine , protonix , rocephin , and thiamine , and vitamin D .   Labs reviewed: K, Mg, and Phos WDL, CBGS: 92-160 (inpatient orders for glycemic control are none).    Diet Order:   Diet Order             Diet NPO time specified  Diet effective now                   EDUCATION NEEDS:   Education needs have been addressed  Skin:  Skin Assessment: Skin Integrity Issues: Skin Integrity Issues:: Other (Comment) Other: irritant contact dermatitis to vagina  Last BM:  01/28/24  Height:   Ht Readings from Last 1 Encounters:  01/22/24 5' (1.524 m)    Weight:   Wt Readings from Last 1 Encounters:  01/30/24 60.5 kg    Ideal Body Weight:  45.5 kg  BMI:  Body mass index is 26.05 kg/m.  Estimated Nutritional Needs:   Kcal:  1400-1600kcal/day  Protein:  70-80g/day  Fluid:  1.4-1.6L/day    Margery ORN, RD, LDN, CDCES Registered Dietitian III Certified Diabetes Care and Education Specialist If unable to reach this RD, please use RD Inpatient group chat on secure chat between hours of 8am-4 pm daily

## 2024-01-30 NOTE — Progress Notes (Signed)
 Claire Copping, MD [facility]   [address], [address] [place], [place] [address] Phone: [phone] Fax : [phone]   Subjective: The patient is much more lethargic today than she has been but I previously saw her.  The patient's INR came down slightly from 3.7 down to 3.2 yesterday.  The AST and ALT have stabilized but the bilirubin continues to increase with a bilirubin of 10.2 yesterday.  The patient has had an NG tube placed for feeding.   Objective: Vital signs in last 24 hours: Vitals:   01/30/24 0500 01/30/24 0600 01/30/24 0749 01/30/24 1127  BP:  95/63 95/68 97/75   Pulse:   67   Resp:   18   Temp:  98.8 F (37.1 C) (!) 97.5 F (36.4 C) 98 F (36.7 C)  TempSrc:      SpO2:  96% 93%   Weight: 60.5 kg     Height:       Weight change: 0.2 kg  Intake/Output Summary (Last 24 hours) at 01/30/2024 1337 Last data filed at 01/30/2024 1129 Gross per 24 hour  Intake 1091.03 ml  Output 500 ml  Net 591.03 ml     Exam: Heart:: Regular rate and rhythm or without murmur or extra heart sounds Lungs: normal and clear to auscultation and percussion Abdomen: soft, nontender, normal bowel sounds   Lab Results: @LABTEST2 @ Micro Results: Recent Results (from the past 240 hours)  Resp panel by RT-PCR (RSV, Flu A&B, Covid) Anterior Nasal Swab     Status: None   Collection Time: 01/22/24  1:06 PM   Specimen: Anterior Nasal Swab  Result Value Ref Range Status   SARS Coronavirus 2 by RT PCR NEGATIVE NEGATIVE Final    Comment: (NOTE) SARS-CoV-2 target nucleic acids are NOT DETECTED.  The SARS-CoV-2 RNA is generally detectable in upper respiratory specimens during the acute phase of infection. The lowest concentration of SARS-CoV-2 viral copies this assay can detect is 138 copies/mL. A negative result does not preclude SARS-Cov-2 infection and should not be used as the sole basis for treatment or other patient management decisions. A negative result may occur with  improper  specimen collection/handling, submission of specimen other than nasopharyngeal swab, presence of viral mutation(s) within the areas targeted by this assay, and inadequate number of viral copies(<138 copies/mL). A negative result must be combined with clinical observations, patient history, and epidemiological information. The expected result is Negative.  Fact Sheet for Patients:  bloggercourse.com  Fact Sheet for Healthcare Providers:  seriousbroker.it  This test is no t yet approved or cleared by the [place]  FDA and  has been authorized for detection and/or diagnosis of SARS-CoV-2 by FDA under an Emergency Use Authorization (EUA). This EUA will remain  in effect (meaning this test can be used) for the duration of the COVID-19 declaration under Section 564(b)(1) of the Act, 21 U.S.C.section 360bbb-3(b)(1), unless the authorization is terminated  or revoked sooner.       Influenza A by PCR NEGATIVE NEGATIVE Final   Influenza B by PCR NEGATIVE NEGATIVE Final    Comment: (NOTE) The Xpert Xpress SARS-CoV-2/FLU/RSV plus assay is intended as an aid in the diagnosis of influenza from Nasopharyngeal swab specimens and should not be used as a sole basis for treatment. Nasal washings and aspirates are unacceptable for Xpert Xpress SARS-CoV-2/FLU/RSV testing.  Fact Sheet for Patients: bloggercourse.com  Fact Sheet for Healthcare Providers: seriousbroker.it  This test is not yet approved or cleared by the [place]  FDA and has  been authorized for detection and/or diagnosis of SARS-CoV-2 by FDA under an Emergency Use Authorization (EUA). This EUA will remain in effect (meaning this test can be used) for the duration of the COVID-19 declaration under Section 564(b)(1) of the Act, 21 U.S.C. section 360bbb-3(b)(1), unless the authorization is terminated or revoked.     Resp  Syncytial Virus by PCR NEGATIVE NEGATIVE Final    Comment: (NOTE) Fact Sheet for Patients: bloggercourse.com  Fact Sheet for Healthcare Providers: seriousbroker.it  This test is not yet approved or cleared by the [place]  FDA and has been authorized for detection and/or diagnosis of SARS-CoV-2 by FDA under an Emergency Use Authorization (EUA). This EUA will remain in effect (meaning this test can be used) for the duration of the COVID-19 declaration under Section 564(b)(1) of the Act, 21 U.S.C. section 360bbb-3(b)(1), unless the authorization is terminated or revoked.  Performed at [facility], [address] [place], [place] [address]   Body fluid culture w Gram Stain     Status: None (Preliminary result)   Collection Time: 01/27/24  3:30 PM   Specimen: PATH Cytology Peritoneal fluid  Result Value Ref Range Status   Specimen Description   Final    PERITONEAL Performed at [facility], [address], [place], [place] [address]    Special Requests   Final    NONE Performed at [facility], [address] [place], [place] [address]    Gram Stain   Final    WBC PRESENT, PREDOMINANTLY PMN NO ORGANISMS SEEN CYTOSPIN SMEAR    Culture   Final    NO GROWTH 3 DAYS Performed at [facility], [address], [place], [place] [address]    Report Status PENDING  Incomplete   Studies/Results: DG Abd 1 View Result Date: 01/29/2024 EXAM: 1 VIEW XRAY OF THE ABDOMEN 01/29/2024 09:45:00 AM COMPARISON: None available. CLINICAL HISTORY: Constipation FINDINGS: LINES, TUBES AND DEVICES: Foley catheter in place. BOWEL: Nonobstructive bowel gas pattern. Stool burden is average. SOFT TISSUES: No abnormal calcifications. BONES: Left femoral neck screws in place. Mild lumbar dextroscoliosis with associated degenerative changes. IMPRESSION: 1. No acute abdominal abnormality. 2. Average stool burden.  Electronically signed by: Greig Pique MD 01/29/2024 03:23 PM EST RP Workstation: [id number]   ECHOCARDIOGRAM COMPLETE Result Date: 01/29/2024    ECHOCARDIOGRAM REPORT   Patient Name:   Claire Brown Date of Exam: 01/29/2024 Medical Rec #:  [id number]         Height:       60.0 in Accession #:    [id number]        Weight:       132.9 lb Date of Birth:  Jun 17, 1960         BSA:          1.569 m Patient Age:    63 years          BP:           118/72 mmHg Patient Gender: F                 HR:           100 bpm. Exam Location:  [facility] Procedure: 2D Echo, Cardiac Doppler, Color Doppler and Intracardiac            Opacification Agent (Both Spectral and Color Flow Doppler were            utilized during procedure). Indications:     CHF-Acute Systolic I50.21  History:         Patient has prior history of Echocardiogram examinations.                  Signs/Symptoms:Hypotension. Headache.  Sonographer:     Bari Roar Referring Phys:  [id number] ELVAN SOR Diagnosing Phys: Marsa Dooms MD  Sonographer Comments: Technically difficult study due to poor echo windows. IMPRESSIONS  1. Left ventricular ejection fraction, by estimation, is 60 to 65%. The left ventricle has normal function. The left ventricle has no regional wall motion abnormalities. Left ventricular diastolic parameters are consistent with Grade I diastolic dysfunction (impaired relaxation).  2. Right ventricular systolic function is normal. The right ventricular size is normal.  3. The mitral valve is normal in structure. Trivial mitral valve regurgitation. No evidence of mitral stenosis.  4. The aortic valve is normal in structure. Aortic valve regurgitation is not visualized. No aortic stenosis is present.  5. The inferior vena cava is normal in size with greater than 50% respiratory variability, suggesting right atrial pressure of 3 mmHg. FINDINGS  Left Ventricle: Left ventricular ejection fraction, by estimation, is 60 to 65%. The left ventricle has  normal function. The left ventricle has no regional wall motion abnormalities. Definity  contrast agent was given IV to delineate the left ventricular  endocardial borders. Strain was performed and the global longitudinal strain is indeterminate. The left ventricular internal cavity size was normal in size. There is no left ventricular hypertrophy. Left ventricular diastolic parameters are consistent with Grade I diastolic dysfunction (impaired relaxation). Right Ventricle: The right ventricular size is normal. No increase in right ventricular wall thickness. Right ventricular systolic function is normal. Left Atrium: Left atrial size was normal in size. Right Atrium: Right atrial size was normal in size. Pericardium: There is no evidence of pericardial effusion. Mitral Valve: The mitral valve is normal in structure. Trivial mitral valve regurgitation. No evidence of mitral valve stenosis. MV peak gradient, 3.0 mmHg. The mean mitral valve gradient is 1.0 mmHg. Tricuspid Valve: The tricuspid valve is normal in structure. Tricuspid valve regurgitation is trivial. No evidence of tricuspid stenosis. Aortic Valve: The aortic valve is normal in structure. Aortic valve regurgitation is not visualized. No aortic stenosis is present. Aortic valve mean gradient measures 2.0 mmHg. Aortic valve peak gradient measures 3.8 mmHg. Aortic valve area, by VTI measures 1.63 cm. Pulmonic Valve: The pulmonic valve was normal in structure. Pulmonic valve regurgitation is not visualized. No evidence of pulmonic stenosis. Aorta: The aortic root is normal in size and structure. Venous: The inferior vena cava is normal in size with greater than 50% respiratory variability, suggesting right atrial pressure of 3 mmHg. IAS/Shunts: No atrial level shunt detected by color flow Doppler. Additional Comments: 3D was performed not requiring image post processing on an independent workstation and was indeterminate.  LEFT VENTRICLE PLAX 2D LVIDd:          3.60 cm   Diastology LVIDs:         2.40 cm   LV e' medial:    7.94 cm/s LV PW:         0.80 cm   LV E/e' medial:  8.4 LV IVS:        1.00 cm   LV e' lateral:   6.74 cm/s LVOT diam:     1.60 cm   LV E/e' lateral: 9.9 LV SV:         28 LV SV Index:   18 LVOT Area:  2.01 cm  RIGHT VENTRICLE RV Basal diam:  2.20 cm RV Mid diam:    2.20 cm RV S prime:     12.50 cm/s TAPSE (M-mode): 1.8 cm LEFT ATRIUM             Index        RIGHT ATRIUM          Index LA diam:        3.10 cm 1.98 cm/m   RA Area:     5.19 cm LA Vol (A2C):   23.7 ml 15.10 ml/m  RA Volume:   7.48 ml  4.77 ml/m LA Vol (A4C):   21.6 ml 13.77 ml/m LA Biplane Vol: 23.4 ml 14.91 ml/m  AORTIC VALVE                    PULMONIC VALVE AV Area (Vmax):    1.75 cm     PV Vmax:        0.96 m/s AV Area (Vmean):   1.47 cm     PV Peak grad:   3.7 mmHg AV Area (VTI):     1.63 cm     RVOT Peak grad: 3 mmHg AV Vmax:           98.10 cm/s AV Vmean:          72.000 cm/s AV VTI:            0.173 m AV Peak Grad:      3.8 mmHg AV Mean Grad:      2.0 mmHg LVOT Vmax:         85.40 cm/s LVOT Vmean:        52.700 cm/s LVOT VTI:          0.140 m LVOT/AV VTI ratio: 0.81  AORTA Ao Root diam: 2.30 cm Ao Asc diam:  2.70 cm MITRAL VALVE MV Area (PHT): 7.29 cm     SHUNTS MV Area VTI:   1.75 cm     Systemic VTI:  0.14 m MV Peak grad:  3.0 mmHg     Systemic Diam: 1.60 cm MV Mean grad:  1.0 mmHg MV Vmax:       0.87 m/s MV Vmean:      48.9 cm/s MV Decel Time: 104 msec MV E velocity: 66.60 cm/s MV A velocity: 107.00 cm/s MV E/A ratio:  0.62 MV A Prime:    10.3 cm/s Marsa Dooms MD Electronically signed by Marsa Dooms MD Signature Date/Time: 01/29/2024/1:02:07 PM    Final    Medications: I have reviewed the patient's current medications. Scheduled Meds:  sodium chloride    Intravenous Once   Chlorhexidine  Gluconate Cloth  6 each Topical Daily   [START ON 01/31/2024] folic acid   1 mg Per Tube Daily   free water   30 mL Per Tube Q4H   Gerhardt's butt cream    Topical BID   lactulose   30 g Oral BID   Or   lactulose   300 mL Rectal BID   midodrine   10 mg Oral TID WC   multivitamin with minerals  1 tablet Per Tube Daily   nystatin    Topical BID   pantoprazole  (PROTONIX ) IV  40 mg Intravenous Q12H   rifaximin   550 mg Per Tube BID   sodium chloride  flush  10-40 mL Intracatheter Q12H   Vitamin D  (Ergocalciferol )  50,000 Units Oral Q7 days   Continuous Infusions:  cefTRIAXone  (ROCEPHIN )  IV 200 mL/hr at 01/29/24 2000   [START  ON 01/31/2024] feeding supplement (OSMOLITE 1.2 CAL)     feeding supplement (OSMOLITE 1.5 CAL) 1,000 mL (01/30/24 1127)   thiamine  (VITAMIN B1) injection 500 mg (01/30/24 0500)   PRN Meds:.dextrose , prochlorperazine , sodium chloride  flush   Assessment: Principal Problem:   Steatohepatitis, severe Active Problems:   Hypokalemia due to excessive gastrointestinal loss of potassium   Hypoglycemia   AKI (acute kidney injury)   Anemia   Thrombocytopenia   Alcohol use disorder   Hypotension   Acute gastroenteritis   Hypoalbuminemia   Malnutrition of moderate degree    Plan: This patient continues to be ill from likely acute on chronic liver damage.  Supportive care at this point is recommended since she is not a candidate for liver transplant and Dr. Maryruth had spoken to the patient's acquaintances and found up if the patient is actively drinking daily.   LOS: 8 days   Claire Copping, MD.FACG 01/30/2024, 1:37 PM Pager [phone] 7am-5pm  Check AMION for 5pm -7am coverage and on weekends

## 2024-01-30 NOTE — Progress Notes (Signed)
 PROGRESS NOTE    Claire Brown  FMW:[id number] DOB: 27-Oct-1960 DOA: 01/22/2024 PCP: Pcp, No    Brief Narrative:   64 y.o. female with medical history significant for Bipolar mood disorder, alcohol and tobacco use disorder, history of admission in 2021 for alcoholic ketoacidosis, being admitted with severe steatohepatitis, AKI, hypoglycemia and electrolyte derangement.  She presented with a 1 week history of vomiting and inability to hold anything down, and difficulty swallowing. Markedly abnormal LFTs with AST/ALT 1290/256, alk phos 712 and total bilirubin 2.7  EKG showed sinus at 82 with low voltage CT soft tissue neck reflecting possible pharyngitis CT abdomen and pelvis: Possible inflammatory or infectious colitis and severe hepatic steatosis, possible gastritis RUQ ultrasound nonspecific gallbladder wall thickening GI consulted and liver enzymes are trending downwards.  Assessment & Plan:   Principal Problem:   Steatohepatitis, severe Active Problems:   Hypokalemia due to excessive gastrointestinal loss of potassium   Hypoglycemia   Hypotension   AKI (acute kidney injury)   Acute gastroenteritis   Hypoalbuminemia   Anemia   Thrombocytopenia   Alcohol use disorder   Malnutrition of moderate degree  # Hepatic encephalopathy 12/13 ammonia level 100 Patient's oral tolerance has been questionable at best NGT now in place, placed by IR 12/15 Plan: Continue lactulose  via tube Added Xifaxan  550 mg twice daily if able to be crushed and placed through NGT New high-dose IV thiamine  Already for tube feeds   # Acute hepatitis secondary to EtOH abuse Acute on chronic, severe Transaminitis Endorses only occasional alcohol use, previous heavy drinker, stopped in May 2025 12/13 as per her emergency contact friend she still drinks alcohol daily Presents with vomiting, elevated AST/ALT, alk phos, bilirubin, INR, anemia thrombocytopenia CT abdomen and pelvis showing severe hepatic  steatosis, possible colitis, possible gastritis Respiratory panel, hepatitis panel are negative Continue IV hydration IV Protonix , IV antiemetics Dietary consulted GI consulted 12/12 INR 3.8, elevated, vitamin K 10 mg subcu x 1 dose given as per GI.  Monitor INR 12/12 pharmacy consulted to start ceftriaxone  and Flagyl   for possible intra-abdominal infection 12/13 d/w GI, less likely acute generalized colitis as there are no symptoms and findings could be secondary to liver cirrhosis.  Discontinued Flagyl  Plan: Continued ceftriaxone  for now for possibility of bleeding due to cirrhosis Can consider N-acetylcysteine versus steroids, though of questionable benefit at this time Needs nutrition, NGT in place      # Hypotension Somewhat persistent but seems to be responding to midodrine  Seems to have baseline low maps Plan: Continue midodrine      Hypoglycemia due to Suspect nutritional/Poor oral intake Similar presentation back in 2017 had a negative insulinoma workup S/p D5 infusion at 50 cc/hr, frequent CBG checks (every 4 hrs) and as needed D50 Plan: NGT.  Tube feeds initiated patient    # Liver cirrhosis with ascites, SBP ruled out S/p paracentesis 1.6 L fluid was tapped by IR, fluid studies negative for SBP      # Hypokalemia due to intractable vomiting and hypovolemia.  Potassium repleted Monitor and replete prn    # Hypophosphatemia, Phos repleted. Monitor electrolytes and replete as needed.   AKI (acute kidney injury): Resolved   Hypoalbuminemia: Due to liver cirrhosis, and nutritional deficiency. S/p IV albumin  2.5 g IV every 6 hourly x 4 doses given Dietary consulted, continue oral protein supplement     Alcohol use disorder: EtOH level undetectable CIWA withdrawal protocol Alcohol abstinence counseling and   Anemia and Thrombocytopenia due to cirrhosis 12/13  Hb 7.3, transfuse 1 unit of PRBC CT angio GI bleeding negative for acute bleeding, generalized acute  colitis, ascites, diffuse body wall edema, moderate layering pleural effusion.  Small volume gas in the urinary bladder, possible gas-forming infection. Monitor CBC daily D/w GI, recommended no EGD, no visible bleeding Continue PPI 40 mg IV twice daily Monitor H&H and transfuse as needed     # Iron  deficiency, iron  level 46, Tsat could not be measured.  Venofer  300 mg x 2 dose given Start oral iron  supplement with vitamin C  Follow-up with PCP to repeat iron  profile in btw 3 to 6 months   #/ Folic acid  deficiency, folic acid  level 4.7, started folic acid  1 mg p.o. daily   # Vitamin D  deficiency: started vitamin D  50,000 units p.o. weekly, follow with PCP to repeat vitamin D  level after 3 to 6 months.   Overall poor prognosis, palliative care consulted to discuss goals of care.  Patient is full code.   Physical deconditioning, POA: Consulted PT/OT.   Disposition: Lives at home. Needs SNF at discharge.    DVT prophylaxis: SCD Code Status: Full Family Communication: None Disposition Plan: Status is: Inpatient Remains inpatient appropriate because: Multiple acute issues as above   Level of care: Progressive  Consultants:  GI Palliative care  Procedures:  Paracentesis  Antimicrobials: Ceftriaxone    Subjective: Seen and examined.  Awake.  Does not participate in interview.  Objective: Vitals:   01/30/24 0500 01/30/24 0600 01/30/24 0749 01/30/24 1127  BP:  95/63 95/68 97/75   Pulse:   67   Resp:   18   Temp:  98.8 F (37.1 C) (!) 97.5 F (36.4 C) 98 F (36.7 C)  TempSrc:      SpO2:  96% 93%   Weight: 60.5 kg     Height:        Intake/Output Summary (Last 24 hours) at 01/30/2024 1230 Last data filed at 01/30/2024 1129 Gross per 24 hour  Intake 1091.03 ml  Output 500 ml  Net 591.03 ml   Filed Weights   01/28/24 0500 01/29/24 0500 01/30/24 0500  Weight: 59.7 kg 60.3 kg 60.5 kg    Examination:  General exam: Appears acutely ill.   Encephalopathic Respiratory system: Clear to auscultation. Respiratory effort normal. Cardiovascular system: S1-S2, RRR, no murmurs, no pedal edema Gastrointestinal system: Soft, T/ND, normal bowel sounds Central nervous system: Alert but lethargic.  Unable to assess orientation Extremities: Power decreased symmetrically Skin: No rashes, lesions or ulcers Psychiatry: Judgement and insight appear impaired. Mood & affect blunted.     Data Reviewed: I have personally reviewed following labs and imaging studies  CBC: Recent Labs  Lab 01/27/24 0429 01/28/24 0600 01/28/24 0857 01/28/24 1845 01/29/24 0528  WBC 7.6 8.6 9.1 8.7 9.5  NEUTROABS  --   --   --  6.4  --   HGB 10.6* 7.1* 7.3* 10.6* 10.2*  HCT 30.2* 20.1* 21.2* 30.1* 28.5*  MCV 111.0* 112.9* 112.8* 100.0 99.3  PLT 48* 23* 23* 17* 14*   Basic Metabolic Panel: Recent Labs  Lab 01/25/24 0520 01/26/24 0438 01/26/24 1140 01/27/24 0429 01/28/24 0600 01/29/24 0528 01/30/24 0500  NA 134*  --  133* 133* 135 140  --   K 3.7  --  3.5 3.1* 3.7 3.6  --   CL 99  --  101 101 107 109  --   CO2 25  --  19* 20* 22 20*  --   GLUCOSE 72  --  121* 94  318* 105*  --   BUN 7*  --  6* 6* 6* 6*  --   CREATININE 0.49  --  0.61 0.78 0.84 0.71  --   CALCIUM 7.8*  --  7.7* 7.6* 7.5* 7.9*  --   MG 2.2 1.9  --  1.9 1.7 1.7 1.7  PHOS 1.7* 1.6*  --  3.1 3.2  --  2.8   GFR: Estimated Creatinine Clearance: 59.3 mL/min (by C-G formula based on SCr of 0.71 mg/dL). Liver Function Tests: Recent Labs  Lab 01/25/24 0520 01/26/24 0438 01/27/24 0429 01/28/24 0600 01/29/24 0528  AST 270* 119* 69* 47* 46*  ALT 154* 108* 83* 48* 46*  ALKPHOS 329* 253* 244* 153* 161*  BILITOT 6.5* 7.6* 8.2* 8.3* 10.2*  PROT 4.1* 3.7* 3.9* 3.5* 3.7*  ALBUMIN  2.5* 2.2* 2.4* 2.6* 2.5*   No results for input(s): LIPASE, AMYLASE in the last 168 hours. Recent Labs  Lab 01/28/24 1142  AMMONIA 100*   Coagulation Profile: Recent Labs  Lab 01/24/24 0411  01/25/24 0013 01/27/24 0733 01/28/24 0500 01/29/24 0559  INR 3.4* 3.0* 3.8* 3.7* 3.2*   Cardiac Enzymes: No results for input(s): CKTOTAL, CKMB, CKMBINDEX, TROPONINI in the last 168 hours.  BNP (last 3 results) Recent Labs    01/29/24 0528  PROBNP 1,750.0*   HbA1C: No results for input(s): HGBA1C in the last 72 hours. CBG: Recent Labs  Lab 01/29/24 1156 01/29/24 1654 01/29/24 2350 01/30/24 0420 01/30/24 0745  GLUCAP 160* 120* 94 92 98   Lipid Profile: No results for input(s): CHOL, HDL, LDLCALC, TRIG, CHOLHDL, LDLDIRECT in the last 72 hours. Thyroid  Function Tests: No results for input(s): TSH, T4TOTAL, FREET4, T3FREE, THYROIDAB in the last 72 hours. Anemia Panel: No results for input(s): VITAMINB12, FOLATE, FERRITIN, TIBC, IRON , RETICCTPCT in the last 72 hours. Sepsis Labs: No results for input(s): PROCALCITON, LATICACIDVEN in the last 168 hours.  Recent Results (from the past 240 hours)  Resp panel by RT-PCR (RSV, Flu A&B, Covid) Anterior Nasal Swab     Status: None   Collection Time: 01/22/24  1:06 PM   Specimen: Anterior Nasal Swab  Result Value Ref Range Status   SARS Coronavirus 2 by RT PCR NEGATIVE NEGATIVE Final    Comment: (NOTE) SARS-CoV-2 target nucleic acids are NOT DETECTED.  The SARS-CoV-2 RNA is generally detectable in upper respiratory specimens during the acute phase of infection. The lowest concentration of SARS-CoV-2 viral copies this assay can detect is 138 copies/mL. A negative result does not preclude SARS-Cov-2 infection and should not be used as the sole basis for treatment or other patient management decisions. A negative result may occur with  improper specimen collection/handling, submission of specimen other than nasopharyngeal swab, presence of viral mutation(s) within the areas targeted by this assay, and inadequate number of viral copies(<138 copies/mL). A negative result must be  combined with clinical observations, patient history, and epidemiological information. The expected result is Negative.  Fact Sheet for Patients:  bloggercourse.com  Fact Sheet for Healthcare Providers:  seriousbroker.it  This test is no t yet approved or cleared by the [place]  FDA and  has been authorized for detection and/or diagnosis of SARS-CoV-2 by FDA under an Emergency Use Authorization (EUA). This EUA will remain  in effect (meaning this test can be used) for the duration of the COVID-19 declaration under Section 564(b)(1) of the Act, 21 U.S.C.section 360bbb-3(b)(1), unless the authorization is terminated  or revoked sooner.       Influenza A by  PCR NEGATIVE NEGATIVE Final   Influenza B by PCR NEGATIVE NEGATIVE Final    Comment: (NOTE) The Xpert Xpress SARS-CoV-2/FLU/RSV plus assay is intended as an aid in the diagnosis of influenza from Nasopharyngeal swab specimens and should not be used as a sole basis for treatment. Nasal washings and aspirates are unacceptable for Xpert Xpress SARS-CoV-2/FLU/RSV testing.  Fact Sheet for Patients: bloggercourse.com  Fact Sheet for Healthcare Providers: seriousbroker.it  This test is not yet approved or cleared by the [place]  FDA and has been authorized for detection and/or diagnosis of SARS-CoV-2 by FDA under an Emergency Use Authorization (EUA). This EUA will remain in effect (meaning this test can be used) for the duration of the COVID-19 declaration under Section 564(b)(1) of the Act, 21 U.S.C. section 360bbb-3(b)(1), unless the authorization is terminated or revoked.     Resp Syncytial Virus by PCR NEGATIVE NEGATIVE Final    Comment: (NOTE) Fact Sheet for Patients: bloggercourse.com  Fact Sheet for Healthcare Providers: seriousbroker.it  This test is not yet  approved or cleared by the [place]  FDA and has been authorized for detection and/or diagnosis of SARS-CoV-2 by FDA under an Emergency Use Authorization (EUA). This EUA will remain in effect (meaning this test can be used) for the duration of the COVID-19 declaration under Section 564(b)(1) of the Act, 21 U.S.C. section 360bbb-3(b)(1), unless the authorization is terminated or revoked.  Performed at [facility], [address] [place], [place] [address]   Body fluid culture w Gram Stain     Status: None (Preliminary result)   Collection Time: 01/27/24  3:30 PM   Specimen: PATH Cytology Peritoneal fluid  Result Value Ref Range Status   Specimen Description   Final    PERITONEAL Performed at [facility], [address] [place], [place] [address]    Special Requests   Final    NONE Performed at [facility], [address] [place], [place] [address]    Gram Stain   Final    WBC PRESENT, PREDOMINANTLY PMN NO ORGANISMS SEEN CYTOSPIN SMEAR    Culture   Final    NO GROWTH 3 DAYS Performed at [facility], [address] [place], [place] [address]    Report Status PENDING  Incomplete         Radiology Studies: DG Abd 1 View Result Date: 01/29/2024 EXAM: 1 VIEW XRAY OF THE ABDOMEN 01/29/2024 09:45:00 AM COMPARISON: None available. CLINICAL HISTORY: Constipation FINDINGS: LINES, TUBES AND DEVICES: Foley catheter in place. BOWEL: Nonobstructive bowel gas pattern. Stool burden is average. SOFT TISSUES: No abnormal calcifications. BONES: Left femoral neck screws in place. Mild lumbar dextroscoliosis with associated degenerative changes. IMPRESSION: 1. No acute abdominal abnormality. 2. Average stool burden. Electronically signed by: Greig Pique MD 01/29/2024 03:23 PM EST RP Workstation: [id number]   ECHOCARDIOGRAM COMPLETE Result Date: 01/29/2024    ECHOCARDIOGRAM REPORT   Patient Name:   RASHAE ROTHER Date of Exam: 01/29/2024  Medical Rec #:  [id number]         Height:       60.0 in Accession #:    [id number]        Weight:       132.9 lb Date of Birth:  08/19/1960         BSA:          1.569 m Patient Age:    63 years          BP:  118/72 mmHg Patient Gender: F                 HR:           100 bpm. Exam Location:  [facility] Procedure: 2D Echo, Cardiac Doppler, Color Doppler and Intracardiac            Opacification Agent (Both Spectral and Color Flow Doppler were            utilized during procedure). Indications:     CHF-Acute Systolic I50.21  History:         Patient has prior history of Echocardiogram examinations.                  Signs/Symptoms:Hypotension. Headache.  Sonographer:     Bari Roar Referring Phys:  [id number] ELVAN SOR Diagnosing Phys: Marsa Dooms MD  Sonographer Comments: Technically difficult study due to poor echo windows. IMPRESSIONS  1. Left ventricular ejection fraction, by estimation, is 60 to 65%. The left ventricle has normal function. The left ventricle has no regional wall motion abnormalities. Left ventricular diastolic parameters are consistent with Grade I diastolic dysfunction (impaired relaxation).  2. Right ventricular systolic function is normal. The right ventricular size is normal.  3. The mitral valve is normal in structure. Trivial mitral valve regurgitation. No evidence of mitral stenosis.  4. The aortic valve is normal in structure. Aortic valve regurgitation is not visualized. No aortic stenosis is present.  5. The inferior vena cava is normal in size with greater than 50% respiratory variability, suggesting right atrial pressure of 3 mmHg. FINDINGS  Left Ventricle: Left ventricular ejection fraction, by estimation, is 60 to 65%. The left ventricle has normal function. The left ventricle has no regional wall motion abnormalities. Definity  contrast agent was given IV to delineate the left ventricular  endocardial borders. Strain was performed and the global longitudinal strain is  indeterminate. The left ventricular internal cavity size was normal in size. There is no left ventricular hypertrophy. Left ventricular diastolic parameters are consistent with Grade I diastolic dysfunction (impaired relaxation). Right Ventricle: The right ventricular size is normal. No increase in right ventricular wall thickness. Right ventricular systolic function is normal. Left Atrium: Left atrial size was normal in size. Right Atrium: Right atrial size was normal in size. Pericardium: There is no evidence of pericardial effusion. Mitral Valve: The mitral valve is normal in structure. Trivial mitral valve regurgitation. No evidence of mitral valve stenosis. MV peak gradient, 3.0 mmHg. The mean mitral valve gradient is 1.0 mmHg. Tricuspid Valve: The tricuspid valve is normal in structure. Tricuspid valve regurgitation is trivial. No evidence of tricuspid stenosis. Aortic Valve: The aortic valve is normal in structure. Aortic valve regurgitation is not visualized. No aortic stenosis is present. Aortic valve mean gradient measures 2.0 mmHg. Aortic valve peak gradient measures 3.8 mmHg. Aortic valve area, by VTI measures 1.63 cm. Pulmonic Valve: The pulmonic valve was normal in structure. Pulmonic valve regurgitation is not visualized. No evidence of pulmonic stenosis. Aorta: The aortic root is normal in size and structure. Venous: The inferior vena cava is normal in size with greater than 50% respiratory variability, suggesting right atrial pressure of 3 mmHg. IAS/Shunts: No atrial level shunt detected by color flow Doppler. Additional Comments: 3D was performed not requiring image post processing on an independent workstation and was indeterminate.  LEFT VENTRICLE PLAX 2D LVIDd:         3.60 cm   Diastology LVIDs:  2.40 cm   LV e' medial:    7.94 cm/s LV PW:         0.80 cm   LV E/e' medial:  8.4 LV IVS:        1.00 cm   LV e' lateral:   6.74 cm/s LVOT diam:     1.60 cm   LV E/e' lateral: 9.9 LV SV:          28 LV SV Index:   18 LVOT Area:     2.01 cm  RIGHT VENTRICLE RV Basal diam:  2.20 cm RV Mid diam:    2.20 cm RV S prime:     12.50 cm/s TAPSE (M-mode): 1.8 cm LEFT ATRIUM             Index        RIGHT ATRIUM          Index LA diam:        3.10 cm 1.98 cm/m   RA Area:     5.19 cm LA Vol (A2C):   23.7 ml 15.10 ml/m  RA Volume:   7.48 ml  4.77 ml/m LA Vol (A4C):   21.6 ml 13.77 ml/m LA Biplane Vol: 23.4 ml 14.91 ml/m  AORTIC VALVE                    PULMONIC VALVE AV Area (Vmax):    1.75 cm     PV Vmax:        0.96 m/s AV Area (Vmean):   1.47 cm     PV Peak grad:   3.7 mmHg AV Area (VTI):     1.63 cm     RVOT Peak grad: 3 mmHg AV Vmax:           98.10 cm/s AV Vmean:          72.000 cm/s AV VTI:            0.173 m AV Peak Grad:      3.8 mmHg AV Mean Grad:      2.0 mmHg LVOT Vmax:         85.40 cm/s LVOT Vmean:        52.700 cm/s LVOT VTI:          0.140 m LVOT/AV VTI ratio: 0.81  AORTA Ao Root diam: 2.30 cm Ao Asc diam:  2.70 cm MITRAL VALVE MV Area (PHT): 7.29 cm     SHUNTS MV Area VTI:   1.75 cm     Systemic VTI:  0.14 m MV Peak grad:  3.0 mmHg     Systemic Diam: 1.60 cm MV Mean grad:  1.0 mmHg MV Vmax:       0.87 m/s MV Vmean:      48.9 cm/s MV Decel Time: 104 msec MV E velocity: 66.60 cm/s MV A velocity: 107.00 cm/s MV E/A ratio:  0.62 MV A Prime:    10.3 cm/s Marsa Dooms MD Electronically signed by Marsa Dooms MD Signature Date/Time: 01/29/2024/1:02:07 PM    Final         Scheduled Meds:  sodium chloride    Intravenous Once   Chlorhexidine  Gluconate Cloth  6 each Topical Daily   feeding supplement (PROSource TF20)  60 mL Per Tube Daily   [START ON 01/31/2024] folic acid   1 mg Per Tube Daily   free water   30 mL Per Tube Q4H   Gerhardt's butt cream   Topical BID   lactulose   30 g  Oral BID   Or   lactulose   300 mL Rectal BID   midodrine   10 mg Oral TID WC   multivitamin with minerals  1 tablet Per Tube Daily   nystatin    Topical BID   pantoprazole  (PROTONIX ) IV  40 mg  Intravenous Q12H   rifaximin   550 mg Per Tube BID   sodium chloride  flush  10-40 mL Intracatheter Q12H   Vitamin D  (Ergocalciferol )  50,000 Units Oral Q7 days   Continuous Infusions:  cefTRIAXone  (ROCEPHIN )  IV 200 mL/hr at 01/29/24 2000   feeding supplement (OSMOLITE 1.5 CAL) 1,000 mL (01/30/24 1127)   thiamine  (VITAMIN B1) injection 500 mg (01/30/24 0500)     LOS: 8 days     Calvin KATHEE Robson, MD [facility]   If 7PM-7AM, please contact night-coverage  01/30/2024, 12:30 PM

## 2024-01-31 ENCOUNTER — Inpatient Hospital Stay

## 2024-01-31 DIAGNOSIS — K7030 Alcoholic cirrhosis of liver without ascites: Secondary | ICD-10-CM | POA: Diagnosis not present

## 2024-01-31 DIAGNOSIS — F1090 Alcohol use, unspecified, uncomplicated: Secondary | ICD-10-CM | POA: Diagnosis not present

## 2024-01-31 DIAGNOSIS — K7010 Alcoholic hepatitis without ascites: Secondary | ICD-10-CM

## 2024-01-31 DIAGNOSIS — J9600 Acute respiratory failure, unspecified whether with hypoxia or hypercapnia: Secondary | ICD-10-CM

## 2024-01-31 DIAGNOSIS — R739 Hyperglycemia, unspecified: Secondary | ICD-10-CM | POA: Diagnosis not present

## 2024-01-31 DIAGNOSIS — E876 Hypokalemia: Secondary | ICD-10-CM | POA: Diagnosis not present

## 2024-01-31 DIAGNOSIS — E861 Hypovolemia: Secondary | ICD-10-CM

## 2024-01-31 DIAGNOSIS — K7031 Alcoholic cirrhosis of liver with ascites: Secondary | ICD-10-CM

## 2024-01-31 DIAGNOSIS — R579 Shock, unspecified: Secondary | ICD-10-CM

## 2024-01-31 DIAGNOSIS — F1010 Alcohol abuse, uncomplicated: Secondary | ICD-10-CM

## 2024-01-31 LAB — BODY FLUID CULTURE W GRAM STAIN: Culture: NO GROWTH

## 2024-01-31 LAB — BLOOD GAS, ARTERIAL
Acid-base deficit: 5 mmol/L — ABNORMAL HIGH (ref 0.0–2.0)
Bicarbonate: 18.6 mmol/L — ABNORMAL LOW (ref 20.0–28.0)
FIO2: 40 %
MECHVT: 400 mL
Mechanical Rate: 18
O2 Saturation: 97.3 %
PEEP: 5 cmH2O
Patient temperature: 37
pCO2 arterial: 30 mmHg — ABNORMAL LOW (ref 32–48)
pH, Arterial: 7.4 (ref 7.35–7.45)
pO2, Arterial: 84 mmHg (ref 83–108)

## 2024-01-31 LAB — COMPREHENSIVE METABOLIC PANEL WITH GFR
ALT: 34 U/L (ref 0–44)
AST: 38 U/L (ref 15–41)
Albumin: 2.6 g/dL — ABNORMAL LOW (ref 3.5–5.0)
Alkaline Phosphatase: 145 U/L — ABNORMAL HIGH (ref 38–126)
Anion gap: 16 — ABNORMAL HIGH (ref 5–15)
BUN: 7 mg/dL — ABNORMAL LOW (ref 8–23)
CO2: 16 mmol/L — ABNORMAL LOW (ref 22–32)
Calcium: 8.5 mg/dL — ABNORMAL LOW (ref 8.9–10.3)
Chloride: 115 mmol/L — ABNORMAL HIGH (ref 98–111)
Creatinine, Ser: 1.04 mg/dL — ABNORMAL HIGH (ref 0.44–1.00)
GFR, Estimated: >60 mL/min (ref >60)
Glucose, Bld: 168 mg/dL — ABNORMAL HIGH (ref 70–99)
Potassium: 3.8 mmol/L (ref 3.5–5.1)
Sodium: 146 mmol/L — ABNORMAL HIGH (ref 135–145)
Total Bilirubin: 11.8 mg/dL — ABNORMAL HIGH (ref 0.0–1.2)
Total Protein: 3.9 g/dL — ABNORMAL LOW (ref 6.5–8.1)

## 2024-01-31 LAB — MAGNESIUM
Magnesium: 1.8 mg/dL (ref 1.7–2.4)
Magnesium: 2.4 mg/dL (ref 1.7–2.4)

## 2024-01-31 LAB — CBC
HCT: 27.3 % — ABNORMAL LOW (ref 36.0–46.0)
Hemoglobin: 9.8 g/dL — ABNORMAL LOW (ref 12.0–15.0)
MCH: 36 pg — ABNORMAL HIGH (ref 26.0–34.0)
MCHC: 35.9 g/dL (ref 30.0–36.0)
MCV: 100.4 fL — ABNORMAL HIGH (ref 80.0–100.0)
Platelets: 26 K/uL — CL (ref 150–400)
RBC: 2.72 MIL/uL — ABNORMAL LOW (ref 3.87–5.11)
RDW: 25 % — ABNORMAL HIGH (ref 11.5–15.5)
WBC: 12.1 K/uL — ABNORMAL HIGH (ref 4.0–10.5)
nRBC: 0.2 % (ref 0.0–0.2)

## 2024-01-31 LAB — LACTIC ACID, PLASMA
Lactic Acid, Venous: 3.8 mmol/L (ref 0.5–1.9)
Lactic Acid, Venous: 5 mmol/L (ref 0.5–1.9)
Lactic Acid, Venous: 4.8 mmol/L (ref 0.5–1.9)

## 2024-01-31 LAB — GLUCOSE, CAPILLARY
Glucose-Capillary: 65 mg/dL — ABNORMAL LOW (ref 70–99)
Glucose-Capillary: 150 mg/dL — ABNORMAL HIGH (ref 70–99)
Glucose-Capillary: 153 mg/dL — ABNORMAL HIGH (ref 70–99)
Glucose-Capillary: 138 mg/dL — ABNORMAL HIGH (ref 70–99)
Glucose-Capillary: 114 mg/dL — ABNORMAL HIGH (ref 70–99)
Glucose-Capillary: 137 mg/dL — ABNORMAL HIGH (ref 70–99)
Glucose-Capillary: 171 mg/dL — ABNORMAL HIGH (ref 70–99)

## 2024-01-31 LAB — PROTIME-INR
INR: 3.5 — ABNORMAL HIGH (ref 0.8–1.2)
Prothrombin Time: 36.9 s — ABNORMAL HIGH (ref 11.4–15.2)

## 2024-01-31 LAB — MRSA NEXT GEN BY PCR, NASAL: MRSA by PCR Next Gen: NOT DETECTED

## 2024-01-31 LAB — AMMONIA: Ammonia: 97 umol/L — ABNORMAL HIGH (ref 9–35)

## 2024-01-31 LAB — PROCALCITONIN: Procalcitonin: 1.33 ng/mL

## 2024-01-31 LAB — PHOSPHORUS: Phosphorus: 2.3 mg/dL — ABNORMAL LOW (ref 2.5–4.6)

## 2024-01-31 MED ORDER — INSULIN ASPART 100 UNIT/ML IJ SOLN
0.0000 [IU] | INTRAMUSCULAR | Status: DC
  Administered 2024-02-01 (×2): 1 [IU] via SUBCUTANEOUS
  Filled 2024-01-31 (×2): qty 1

## 2024-01-31 MED ORDER — ROCURONIUM BROMIDE 10 MG/ML (PF) SYRINGE: 100.0000 mg | PREFILLED_SYRINGE | Freq: Once | INTRAVENOUS | Status: AC

## 2024-01-31 MED ORDER — CHLORHEXIDINE GLUCONATE CLOTH 2 % EX PADS
6.0000 | MEDICATED_PAD | Freq: Every day | CUTANEOUS | Status: DC
  Administered 2024-02-01 – 2024-02-02 (×2): 6 via TOPICAL

## 2024-01-31 MED ORDER — PROSOURCE TF20 ENFIT COMPATIBL EN LIQD
60.0000 mL | Freq: Every day | ENTERAL | Status: DC
  Administered 2024-02-01 – 2024-02-03 (×3): 60 mL
  Filled 2024-01-31: qty 60

## 2024-01-31 MED ORDER — POTASSIUM CHLORIDE 10 MEQ/100ML IV SOLN
10.0000 meq | INTRAVENOUS | Status: AC
  Administered 2024-01-31 (×4): 10 meq via INTRAVENOUS
  Filled 2024-01-31 (×4): qty 100

## 2024-01-31 MED ORDER — LACTULOSE ENEMA
300.0000 mL | Freq: Two times a day (BID) | ORAL | Status: DC
  Filled 2024-01-31 (×8): qty 300

## 2024-01-31 MED ORDER — LACTULOSE 10 GM/15ML PO SOLN
30.0000 g | Freq: Two times a day (BID) | ORAL | Status: DC
  Administered 2024-01-31 – 2024-02-03 (×7): 30 g
  Filled 2024-01-31 (×7): qty 60

## 2024-01-31 MED ORDER — MAGNESIUM SULFATE 2 GM/50ML IV SOLN
2.0000 g | Freq: Once | INTRAVENOUS | Status: AC
  Administered 2024-01-31: 01:00:00 2 g via INTRAVENOUS
  Filled 2024-01-31: qty 50

## 2024-01-31 MED ORDER — ETOMIDATE 2 MG/ML IV SOLN
INTRAVENOUS | Status: AC
  Administered 2024-01-31: 07:00:00 10 mg via INTRAVENOUS
  Filled 2024-01-31: qty 20

## 2024-01-31 MED ORDER — LACTULOSE 10 GM/15ML PO SOLN
10.0000 g | Freq: Once | ORAL | Status: AC
  Administered 2024-01-31: 03:00:00 10 g
  Filled 2024-01-31: qty 30

## 2024-01-31 MED ORDER — ROCURONIUM BROMIDE 10 MG/ML (PF) SYRINGE
PREFILLED_SYRINGE | INTRAVENOUS | Status: AC
  Administered 2024-01-31: 07:00:00 80 mg via INTRAVENOUS
  Filled 2024-01-31: qty 10

## 2024-01-31 MED ORDER — MIDODRINE HCL 5 MG PO TABS
10.0000 mg | ORAL_TABLET | Freq: Three times a day (TID) | ORAL | Status: DC
  Administered 2024-01-31 – 2024-02-03 (×9): 10 mg
  Filled 2024-01-31 (×9): qty 2

## 2024-01-31 MED ORDER — GADOBUTROL 1 MMOL/ML IV SOLN
6.0000 mL | Freq: Once | INTRAVENOUS | Status: AC | PRN
  Administered 2024-01-31: 15:00:00 6 mL via INTRAVENOUS

## 2024-01-31 MED ORDER — SODIUM BICARBONATE 8.4 % IV SOLN
INTRAVENOUS | Status: AC
  Administered 2024-01-31: 07:00:00 50 meq
  Filled 2024-01-31: qty 50

## 2024-01-31 MED ORDER — ETOMIDATE 2 MG/ML IV SOLN: 10.0000 mg | Freq: Once | INTRAVENOUS | Status: AC

## 2024-01-31 MED ORDER — SODIUM POLYSTYRENE SULFONATE 15 GM/60ML CO SUSP: 30.0000 g | Freq: Once | Status: DC

## 2024-01-31 MED ORDER — VITAL 1.5 CAL PO LIQD
1000.0000 mL | ORAL | Status: DC
  Administered 2024-01-31 – 2024-02-02 (×3): 1000 mL

## 2024-01-31 MED ORDER — FUROSEMIDE 10 MG/ML IJ SOLN
40.0000 mg | Freq: Once | INTRAMUSCULAR | Status: AC
  Administered 2024-01-31: 17:00:00 40 mg via INTRAVENOUS
  Filled 2024-01-31: qty 4

## 2024-01-31 MED ORDER — FUROSEMIDE 10 MG/ML IJ SOLN
20.0000 mg | Freq: Once | INTRAMUSCULAR | Status: AC
  Administered 2024-01-31: 02:00:00 20 mg via INTRAVENOUS
  Filled 2024-01-31: qty 2

## 2024-01-31 MED ORDER — NOREPINEPHRINE 4 MG/250ML-% IV SOLN
0.0000 ug/min | INTRAVENOUS | Status: DC
  Administered 2024-01-31: 15:00:00 5 ug/min via INTRAVENOUS
  Administered 2024-01-31: 06:00:00 2 ug/min via INTRAVENOUS
  Administered 2024-02-01: 18:00:00 6 ug/min via INTRAVENOUS
  Administered 2024-02-02: 04:00:00 10 ug/min via INTRAVENOUS
  Filled 2024-01-31 (×4): qty 250

## 2024-01-31 MED ORDER — LACTULOSE ENEMA
300.0000 mL | Freq: Once | ORAL | Status: DC
  Filled 2024-01-31: qty 300

## 2024-01-31 MED ORDER — ORAL CARE MOUTH RINSE
15.0000 mL | OROMUCOSAL | Status: DC
  Administered 2024-01-31 – 2024-02-03 (×37): 15 mL via OROMUCOSAL

## 2024-01-31 MED ORDER — ORAL CARE MOUTH RINSE: 15.0000 mL | OROMUCOSAL | Status: DC | PRN

## 2024-01-31 MED ORDER — VITAMIN C 500 MG PO TABS
500.0000 mg | ORAL_TABLET | Freq: Two times a day (BID) | ORAL | Status: DC
  Administered 2024-01-31 – 2024-02-03 (×6): 500 mg
  Filled 2024-01-31 (×6): qty 1

## 2024-01-31 MED ORDER — ALBUMIN HUMAN 25 % IV SOLN
25.0000 g | Freq: Once | INTRAVENOUS | Status: AC
  Administered 2024-01-31: 15:00:00 25 g via INTRAVENOUS
  Filled 2024-01-31: qty 100

## 2024-01-31 NOTE — Plan of Care (Signed)
°  Problem: Clinical Measurements: Goal: Will remain free from infection Outcome: Not Progressing Goal: Diagnostic test results will improve Outcome: Not Progressing Goal: Respiratory complications will improve Outcome: Not Progressing   Problem: Activity: Goal: Risk for activity intolerance will decrease Outcome: Not Progressing   Problem: Nutrition: Goal: Adequate nutrition will be maintained Outcome: Not Progressing   Problem: Coping: Goal: Level of anxiety will decrease Outcome: Not Progressing

## 2024-01-31 NOTE — Consult Note (Signed)
 NAME:  Claire Brown, MRN:  [id number], DOB:  [birth year], LOS: 9 ADMISSION DATE:  01/22/2024, CONSULTATION DATE:  12/16 REFERRING MD:  Dr. Lawence, CHIEF COMPLAINT:  AMS   History of Present Illness:  Patient is a 63 yo F w/ pertinent PMH Bipolar, anxiety, Cirrhosis, ETOH and tobacco abuse presents to [facility] on 12/7 w/ electrolytes derangement, AKI, steatohepatitis, hypoglycemia.  Patient w/ 1 week of vomiting. Came to [facility] on 12/7. LFTs markedly elevated and t bili 2.7. CT abd/pelvis possible infectious coitis and severe hepatic steatosis; possible gastritis. GI consulted and following. Started on PPI. Home lactulose  and rifaximin  resumed. Patient also w/ AKI, hypoglycemia, and electrolyte derangements. Electrolytes repleted and given iv fluids. Given hx of alcohol use CIWA protocol in place. On 12/12 started on rocephin /flagyl  and paracentesis performed w/ 1.6 L of fluid removed which is negative for SBP.   On 12/15 Rapid response called overnight. Tmax 100.6 F and wbc trending up to 12.1. Cultures obtained and abx broadened. Overnight developed respiratory distress and became altered. CT head no acute abnormality. Concerned for aspiration pna vs. Pulmonary edema. Given lasix . Developed worsening hypotension started on levo and brought to icu. Worsening sob and ams patient intubated. PCCM consulted.  Pertinent  Medical History   Past Medical History:  Diagnosis Date   Anemia    Anxiety    Arthritis    Bipolar disorder (HCC)    Headache      Significant Hospital Events: Including procedures, antibiotic start and stop dates in addition to other pertinent events   12/7 admti 12/15 worsening ams and sob; intubated  Interim History / Subjective:  Rapid response overnight. Worsening ams, sob, hypotensive. Cultures obtained and started on unasyn  and doxy for possible aspiration. Started on levo. Patient intubated for airway protection. CT head no acute abnormality.   Today patient  intubated  Not on sedation and unresponsive  Objective    Blood pressure 107/80, pulse (!) 106, temperature 98 F (36.7 C), temperature source Axillary, resp. rate 18, height 5' (1.524 m), weight 60.1 kg, SpO2 91%.    Vent Mode: PRVC FiO2 (%):  [40 %-60 %] 60 % Set Rate:  [18 bmp] 18 bmp Vt Set:  [400 mL] 400 mL PEEP:  [5 cmH20] 5 cmH20   Intake/Output Summary (Last 24 hours) at 01/31/2024 0751 Last data filed at 01/31/2024 0700 Gross per 24 hour  Intake 2292.53 ml  Output 200 ml  Net 2092.53 ml   Filed Weights   01/30/24 0500 01/31/24 0044 01/31/24 0459  Weight: 60.5 kg 60.1 kg 60.1 kg    Examination: General:  critically ill appearing on mech vent HEENT: MM pink/moist; scleral icterus; ETT in place Neuro: unresponsive; pupils pinpoint sluggish b/l; no cough/gag CV: s1s2, RRR, no m/r/g PULM:  dim clear BS bilaterally; on mech vent PRVC GI: soft, bsx4 active  Extremities: warm/dry, trace edema; jaundice   Resolved problem list   Assessment and Plan   Acute hepatic encephalopathy -became more unresponsive overnight on 12/15; ct head negative; ammonia 90s from 100 yesterday Plan: -check MRI and EEG for completeness -trend ammonia; cont lactulose  and rifaximin  -limit sedating meds -cont high dose thiamine   Acute respiratory failure Possible aspiration pna Plan: -intubated overnight and placed on vent -LTVV strategy with tidal volumes of 6-8 cc/kg ideal body weight -Wean PEEP/FiO2 for SpO2 >92% -VAP bundle in place -Daily SAT and SBT -PAD protocol in place -wean sedation for RASS goal 0 to -1 -cont unasyn  and doxy; follow bcx2 and  trach aspirate  Acute hepatitis 2/2 ETOH abuse Acute on chronic, severe Transaminitis Liver cirrhosis w/ ascites Hypoalbuminemia -para 12/12 pulled 1.6 liters of fluid; negative for SBP Plan: -GI following; appreciate recs -trend lfts, inr, ammonia -cont lactulose  and rifaximin   Shock: post intubation requiring low dose  levo Plan: -levo for map goal >65 -albumin  -midodrine  -abx as above for aspiration -follow cultures on 12/15 and trach aspirate  Hypoglycemia Plan: -cbg monitoring -cont TF  Hypokalemia Hypophosphatemia AKI: improving Plan: -Trend BMP / urinary output -Replace electrolytes as indicated -Avoid nephrotoxic agents, ensure adequate renal perfusion  Alcohol use disorder Plan: -thiamine , folic acid , mvi  Anemia Thrombocytopenia Plan: -trend cbc   Best Practice (right click and Reselect all SmartList Selections daily)   Diet/type: tubefeeds DVT prophylaxis: SCD; platelets 26 this am GI prophylaxis: PPI Lines: N/A Foley:  Yes, and it is still needed Code Status:  full code Last date of multidisciplinary goals of care discussion [Palliative following for ongoing GOC conversations. Overall poor prognosis]   Labs   CBC: Recent Labs  Lab 01/28/24 0857 01/28/24 1845 01/29/24 0528 01/30/24 2031 01/31/24 0429  WBC 9.1 8.7 9.5 10.1 12.1*  NEUTROABS  --  6.4  --   --   --   HGB 7.3* 10.6* 10.2* 10.3* 9.8*  HCT 21.2* 30.1* 28.5* 28.2* 27.3*  MCV 112.8* 100.0 99.3 98.3 100.4*  PLT 23* 17* 14* 32* 26*    Basic Metabolic Panel: Recent Labs  Lab 01/25/24 0520 01/26/24 0438 01/26/24 1140 01/27/24 0429 01/28/24 0600 01/29/24 0528 01/30/24 0500 01/30/24 2031 01/30/24 2249  NA 134*  --  133* 133* 135 140  --  143  --   K 3.7  --  3.5 3.1* 3.7 3.6  --  3.1*  --   CL 99  --  101 101 107 109  --  115*  --   CO2 25  --  19* 20* 22 20*  --  17*  --   GLUCOSE 72  --  121* 94 318* 105*  --  123*  --   BUN 7*  --  6* 6* 6* 6*  --  7*  --   CREATININE 0.49  --  0.61 0.78 0.84 0.71  --  0.91  --   CALCIUM 7.8*  --  7.7* 7.6* 7.5* 7.9*  --  8.6*  --   MG 2.2 1.9  --  1.9 1.7 1.7 1.7  --  1.8  PHOS 1.7* 1.6*  --  3.1 3.2  --  2.8  --   --    GFR: Estimated Creatinine Clearance: 51.9 mL/min (by C-G formula based on SCr of 0.91 mg/dL). Recent Labs  Lab 01/28/24 1845  01/29/24 0528 01/30/24 2031 01/31/24 0050 01/31/24 0429 01/31/24 0610  PROCALCITON  --   --   --   --   --  1.33  WBC 8.7 9.5 10.1  --  12.1*  --   LATICACIDVEN  --   --  3.3* 3.8* 5.0* 4.8*    Liver Function Tests: Recent Labs  Lab 01/26/24 0438 01/27/24 0429 01/28/24 0600 01/29/24 0528 01/30/24 2031  AST 119* 69* 47* 46* 37  ALT 108* 83* 48* 46* 37  ALKPHOS 253* 244* 153* 161* 160*  BILITOT 7.6* 8.2* 8.3* 10.2* 12.7*  PROT 3.7* 3.9* 3.5* 3.7* 4.1*  ALBUMIN  2.2* 2.4* 2.6* 2.5* 2.7*   No results for input(s): LIPASE, AMYLASE in the last 168 hours. Recent Labs  Lab 01/28/24 1142 01/30/24 2308  AMMONIA 100* 104*    ABG    Component Value Date/Time   PHART 7.39 03/29/2016 0525   PCO2ART 24 (L) 03/29/2016 0525   PO2ART 94 03/29/2016 0525   HCO3 16.9 (L) 01/30/2024 2042   ACIDBASEDEF 6.0 (H) 01/30/2024 2042   O2SAT 98.9 01/30/2024 2042     Coagulation Profile: Recent Labs  Lab 01/25/24 0013 01/27/24 0733 01/28/24 0500 01/29/24 0559  INR 3.0* 3.8* 3.7* 3.2*    Cardiac Enzymes: No results for input(s): CKTOTAL, CKMB, CKMBINDEX, TROPONINI in the last 168 hours.  HbA1C: No results found for: HGBA1C  CBG: Recent Labs  Lab 01/30/24 1948 01/30/24 2357 01/31/24 0311 01/31/24 0337 01/31/24 0733  GLUCAP 107* 79 65* 150* 153*    Review of Systems:   Patient is encephalopathic and/or intubated; therefore, history has been obtained from chart review.    Past Medical History:  She,  has a past medical history of Anemia, Anxiety, Arthritis, Bipolar disorder (HCC), and Headache.   Surgical History:   Past Surgical History:  Procedure Laterality Date   CESAREAN SECTION     CYSTOSCOPY N/A 10/14/2015   Procedure: CYSTOSCOPY;  Surgeon: Glory High, MD;  Location: [facility] ORS;  Service: Gynecology;  Laterality: N/A;   FRACTURE SURGERY Left 12/2015   Femoral Neck Pin Placement - Dr. Madelynn ([facility])   KNEE SURGERY Bilateral     LAPAROSCOPIC BILATERAL SALPINGO OOPHERECTOMY  10/14/2015   Procedure: LAPAROSCOPIC BILATERAL SALPINGO OOPHORECTOMY;  Surgeon: Glory High, MD;  Location: [facility] ORS;  Service: Gynecology;;   LAPAROSCOPIC HYSTERECTOMY N/A 10/14/2015   Procedure: HYSTERECTOMY TOTAL LAPAROSCOPIC;  Surgeon: Glory High, MD;  Location: [facility] ORS;  Service: Gynecology;  Laterality: N/A;     Social History:   reports that she has been smoking cigarettes. She has never used smokeless tobacco. She reports current alcohol use. She reports that she does not use drugs.   Family History:  Her family history includes Cancer in her brother.   Allergies Allergies[1]   Home Medications  Prior to Admission medications  Medication Sig Start Date End Date Taking? Authorizing Provider  acetaminophen  (TYLENOL ) 325 MG tablet Take 650 mg by mouth every 12 (twelve) hours as needed for mild pain (pain score 1-3) or moderate pain (pain score 4-6).   Yes [provider]  dimenhyDRINATE (DRAMAMINE) 50 MG tablet Take 50 mg by mouth every 8 (eight) hours as needed.   Yes [provider]  diphenhydrAMINE  (BENADRYL ) 25 MG tablet Take 25 mg by mouth every 6 (six) hours as needed for allergies.   Yes [provider]  Multiple Vitamin (MULTIVITAMIN WITH MINERALS) TABS tablet Take 1 tablet by mouth daily.   Yes [provider]  Potassium Gluconate 550 MG TABS Take 550 mg by mouth every morning.   Yes [provider]     Critical care time: 45 minutes     JD Emilio RIGGERS [facility] 01/31/2024, 7:51 AM  Please see [email or web address] for pager details.  From 7A-7P if no response, please call [phone]. After hours, please call ELink [phone].          [1]  Allergies Allergen Reactions   Amoxicillin-Pot Clavulanate Nausea Only and Nausea And Vomiting   Oxycodone  Anxiety, Other (See Comments) and Dermatitis    Reaction: jittery/ unable to sleep    Penicillins Itching, Other (See Comments) and Hives    Patient cannot remember the specifics of the reaction. Has taken Amoxicillin since with no problem.   Diazepam Anxiety, Other (  See Comments) and Nausea Only    Reaction: unable to sleep

## 2024-01-31 NOTE — Progress Notes (Addendum)
 Nutrition Follow Up Note   DOCUMENTATION CODES:   Non-severe (moderate) malnutrition in context of chronic illness  INTERVENTION:   Vital 1.5@45ml /hr continuous + ProSource TF 20- Give 60ml daily via tube  Free water  flushes q4 hours (if being diuresed)  Regimen provides 1700kcal/day, 93g/day protein and 1481ml/day of free water .   MVI daily via tube   Folic acid  1mg  daily via tube x 30 days   Thiamine  100mg  daily via tube after high dose complete   Vitamin C  500mg  po BID  Ergocalciferol  50,000 units po weekly x 6 weeks   Pt remains at high refeed risk; recommend monitor potassium, magnesium  and phosphorus labs daily until stable  Daily weights   NUTRITION DIAGNOSIS:   Moderate Malnutrition related to chronic illness (Steatohepatitis) as evidenced by mild fat depletion, moderate fat depletion, mild muscle depletion, moderate muscle depletion. -ongoing   GOAL:   Patient will meet greater than or equal to 90% of their needs -not met   MONITOR:   Vent status, Labs, Weight trends, TF tolerance, Skin, I & O's  ASSESSMENT:   63 y/o female with h/o etoh abusem ovarian mass, anxiety, bipolar disorder and hiatal hernia who is admitted with steatohepatitis and new cirrhosis complicated by AMS, septic shock and aspiration PNA.  -Pt s/p fluoroscopy guided post pyloric dobhoff tube placement 12/15  Pt ventilated for airway protection. Dobhoff tube remains in place and is post pyloric. Tube feeds are being held currently; will plan to resume tube feeds after MRI today. Pt remains at refeed risk. Pt with mild hypernatremia; will increase free water  if patient is diuresed. Pt with folate deficiency that is being supplemented. Pt with elevated INR and is at high risk for vitamin K deficiency r/t cirrhosis; this was discussed with medical team. Pt did receive one dose of vitamin K on 12/12. Plan is for MRI brain today. Palliative care is following.   Per chart, pt is up ~22lbs  from his UBW. Pt + 15.2L on her I & Os. Pt with anasarca and jaundice. Pt received one dose of lasix  overnight. Pt received albumin  on 12/8 and 12/11.   Medications reviewed and include: folic acid , insulin , lactulose , midodrine , MVI, protonix , xifaxin, ergocalciferol , unasyn , doxycycline , levophed , thiamine    Labs reviewed: Na 146(H), K 3.8 wnl, BUN 7(L), creat 1.04(H), P 2.3(L), Mg 2.4 wnl, tbili 11.8(H) Ammonia- 97(H) INR- 3.5(H) Folate- 4.7(L), B12 2602(H), vitamin D  17.67(L)- 12/8 Wbc- 12.1(H), Hgb 9.8(L), Hct 27.3(L), MCV 100.4(H), MCH 36.0(H) Cbgs- 138, 153, 150, 65 x 24 hrs   Patient is currently intubated on ventilator support MV: 8.1 L/min Temp (24hrs), Avg:98.3 F (36.8 C), Min:97.6 F (36.4 C), Max:100.6 F (38.1 C)  MAP >20mmHg   UOP-   Diet Order:    Diet Order             Diet NPO time specified  Diet effective now                  EDUCATION NEEDS:   Education needs have been addressed  Skin:  Skin Assessment: Reviewed RN Assessment (Stage I sacrum, MASD)  Last BM:  12/16- type 6  Height:   Ht Readings from Last 1 Encounters:  01/31/24 5' (1.524 m)    Weight:   Wt Readings from Last 1 Encounters:  01/31/24 60.1 kg   BMI:  Body mass index is 25.88 kg/m.  Estimated Nutritional Needs:   Kcal:  1400-1600kcal/day  Protein:  80-90g/day  Fluid:  1.4-1.6L/day  Augustin Shams MS, RD, LDN If unable to be reached, please send secure chat to RD inpatient available from 8:00a-4:00p daily

## 2024-01-31 NOTE — Procedures (Signed)
 INTUBATION PROCEDURE NOTE  Claire Brown  [id number]  1960-07-05  Date:01/31/2024  Time:6:47 AM   Provider Performing:Cleta Heatley A Lyliana Dicenso   Procedure: Intubation (31500)  Indication(s) Respiratory Failure  Consent Unable to obtain consent due to emergent nature of procedure.  Anesthesia Etomidate  and Rocuronium   Time Out Verified patient identification, verified procedure, site/side was marked, verified correct patient position, special equipment/implants available, medications/allergies/relevant history reviewed, required imaging and test results available.  Sterile Technique Usual hand hygeine, masks, and gloves were used  Procedure Description Patient positioned in bed supine.  Sedation given as noted above.  Patient was intubated with endotracheal tube using Glidescope.  View was Grade 1 full glottis .  Number of attempts was 1.  Colorimetric CO2 detector was consistent with tracheal placement.  Complications/Tolerance None; patient tolerated the procedure well. Chest X-ray is ordered to verify placement.  EBL Minimal  Specimen(s) None     Almarie Nose DNP, CCRN, FNP-C, AGACNP-BC Acute Care & Family Nurse Practitioner [facility] PCCM on call pager [phone]

## 2024-01-31 NOTE — Progress Notes (Signed)
 Eeg done

## 2024-01-31 NOTE — Progress Notes (Signed)
 Palliative Care Progress Note, Assessment & Plan   Patient Name: Claire Brown       Date: 01/31/2024 DOB: 1960/04/07  Age: 63 y.o. MRN#: [id number] Attending Physician: Isaiah Scrivener, MD Primary Care Physician: Pcp, No Admit Date: 01/22/2024  Subjective: Patient is lying in bed on ventilatory support with no sedation.  NG to right nare.  Patient's significant other [name] and his mother Nena are present at bedside during my visit.  HPI: 63 y.o. female  with past medical history of bipolar disorder, cirrhosis and ETOH and tobacco use admitted from home on 01/22/2024 with complaints of N/V/D and associated poor PO intake x 1 week.   In ED found to have significantly elevated liver enzymes--now normalized, elevated bilirubin and INR Found to have severe steatohepatitis, GI consulted and following close Anemic-requiring blood transfusions Encephalopathic--CIWA protocol   Palliative medicine was consulted for assisting with goals of care conversations.  Summary of counseling/coordination of care: Extensive chart review completed prior to meeting patient including labs, vital signs, imaging, progress notes, orders, and available advanced directive documents from current and previous encounters.   After reviewing the patient's chart and assessing the patient at bedside, I spoke with [name] and Nena in regards to symptom management and goals of care.   Medical update and course of hospitalization discussed.  Space and opportunity provided for [name] and his mother to share thoughts and emotions regarding patient's current medical situation.  Discussed that patient is off of sedation but is not waking up to follow commands.  Reviewed that etiology may be related to underlying neurological issues.   Therefore, MRI of brain has been ordered in addition to EEG.  Education provided on MRI and EEG.  I attempted to elicit bicycles important to patient as well as her significant other [name].  [name] shares that patient has always said that she would never want [name] to let me go.  Shares that she has always wanted him to be by her side and would never want him to give up on me.  Discussed significance of patient's cirrhosis and EtOH use as significant contributing factors to her overall prognosis.  Discussed events of earlier this morning leading to patient's need for intubation.  Reviewed CT scan of head revealed no acute intracranial abnormalities.  Discussed use of antibiotics in case patient has aspirated and developing a pneumonia.  Additionally, blood cultures were ordered to rule out blood-borne infections.  Discussed that patient continues to have hepatic encephalopathy, elevated ammonia levels, low potassium, low magnesium , and elevated lactic acid.  All of these values conveyed patient is developing septic shock-which is required support of Levophed  and close ICU monitoring.  Family was appreciative of medical update.  They share that they wish to continue with current plan of care.  I discussed advance directives and next of kin decision maker.  In the absence of an HCPOA, spouse, healthcare agent, reasonably available adult children, reasonably available siblings, or reasonably available parents, patient's next of kin would fall to an individual who has an established relationship with the patient, who is acting in good faith on behalf of the patient, and who can reliably convey the patient's wishes. [name] shares he is accepting  of this role to help make medical decisions for her at this time.  [name] endorses that patient has no living family (mother, father, all siblings are deceased) other than her son-whom [name] has never met or spoken to the entirety of the 11 years that he has been with  the patient. [name] does not have a phone number to contact patient's son.   Reviewed that we are awaiting results of MRI before proceeding with additional goals of care discussions.  At this time, full code and full scope remain.  PMT will continue to follow and support.  Physical Exam Vitals reviewed.  Constitutional:      General: She is not in acute distress.    Appearance: She is ill-appearing.  HENT:     Mouth/Throat:     Mouth: Mucous membranes are moist.  Cardiovascular:     Rate and Rhythm: Normal rate.  Pulmonary:     Comments: Mechanical ventilatory support Skin:    Coloration: Skin is jaundiced.  Neurological:     Comments: nonverbal              Recommendations:   Full code/full scope MRI of brain pending Ongoing support for patient's significant other [name] to continue  50 minute visit includes: Detailed review of medical records (labs, imaging, vital signs), medically appropriate exam (mental status, respiratory, cardiac, skin), discussed with treatment team, counseling and educating patient, family and staff, documenting clinical information, medication management and coordination of care.  Lamarr L. Arvid, DNP, FNP-BC Palliative Medicine Team

## 2024-01-31 NOTE — Progress Notes (Addendum)
 Patient transported to MRI and back on ventilator with no complications.  O2 saturation and vital signs remained stable throughout.

## 2024-01-31 NOTE — Progress Notes (Signed)
 SLP Cancellation Note  Patient Details Name: RONNETTA CURRINGTON MRN: [id number] DOB: May 29, 1960   Cancelled treatment:       Reason Eval/Treat Not Completed: Medical issues which prohibited therapy (chart reviewed)  Pt was transferred to CCU d/t decline in medical status; now requiring intubation/sedation. MD to reconsult ST services when medically appropriate.     Comer Portugal, MS, CCC-SLP Speech Language Pathologist Rehab Services; [facility] [phone] (ascom) Marius Betts 01/31/2024, 8:26 AM

## 2024-01-31 NOTE — Progress Notes (Signed)
 Rogelia Copping, MD [facility]   [address], [address] [place], [place] [address] Phone: [phone] Fax : [phone]   Subjective: This patient was transferred to the ICU due to mental status changes and has been intubated.  The patient has alcoholic liver disease with daily drinking prior to admission.  The patient's INR has been elevated and the bilirubin continues to increase despite the liver enzymes coming down in general.  The patient had an MRI of the brain that showed no acute intracranial abnormalities.   Objective: Vital signs in last 24 hours: Vitals:   01/31/24 0900 01/31/24 1000 01/31/24 1100 01/31/24 1200  BP: (!) 88/66 112/87    Pulse: (!) 104 (!) 104 (!) 102   Resp: 18 18 20    Temp:    97.7 F (36.5 C)  TempSrc:    Axillary  SpO2: 98% 98% 97%   Weight:      Height:       Weight change: -0.4 kg  Intake/Output Summary (Last 24 hours) at 01/31/2024 1553 Last data filed at 01/31/2024 1508 Gross per 24 hour  Intake 2361.04 ml  Output 265 ml  Net 2096.04 ml     Exam: Heart:: Regular rate and rhythm or without murmur or extra heart sounds Lungs: normal and clear to auscultation and percussion Abdomen: soft, nontender, normal bowel sounds   Lab Results: @LABTEST2 @ Micro Results: Recent Results (from the past 240 hours)  Resp panel by RT-PCR (RSV, Flu A&B, Covid) Anterior Nasal Swab     Status: None   Collection Time: 01/22/24  1:06 PM   Specimen: Anterior Nasal Swab  Result Value Ref Range Status   SARS Coronavirus 2 by RT PCR NEGATIVE NEGATIVE Final    Comment: (NOTE) SARS-CoV-2 target nucleic acids are NOT DETECTED.  The SARS-CoV-2 RNA is generally detectable in upper respiratory specimens during the acute phase of infection. The lowest concentration of SARS-CoV-2 viral copies this assay can detect is 138 copies/mL. A negative result does not preclude SARS-Cov-2 infection and should not be used as the sole basis for treatment or other patient  management decisions. A negative result may occur with  improper specimen collection/handling, submission of specimen other than nasopharyngeal swab, presence of viral mutation(s) within the areas targeted by this assay, and inadequate number of viral copies(<138 copies/mL). A negative result must be combined with clinical observations, patient history, and epidemiological information. The expected result is Negative.  Fact Sheet for Patients:  bloggercourse.com  Fact Sheet for Healthcare Providers:  seriousbroker.it  This test is no t yet approved or cleared by the [place]  FDA and  has been authorized for detection and/or diagnosis of SARS-CoV-2 by FDA under an Emergency Use Authorization (EUA). This EUA will remain  in effect (meaning this test can be used) for the duration of the COVID-19 declaration under Section 564(b)(1) of the Act, 21 U.S.C.section 360bbb-3(b)(1), unless the authorization is terminated  or revoked sooner.       Influenza A by PCR NEGATIVE NEGATIVE Final   Influenza B by PCR NEGATIVE NEGATIVE Final    Comment: (NOTE) The Xpert Xpress SARS-CoV-2/FLU/RSV plus assay is intended as an aid in the diagnosis of influenza from Nasopharyngeal swab specimens and should not be used as a sole basis for treatment. Nasal washings and aspirates are unacceptable for Xpert Xpress SARS-CoV-2/FLU/RSV testing.  Fact Sheet for Patients: bloggercourse.com  Fact Sheet for Healthcare Providers: seriousbroker.it  This test is not yet approved or cleared by the [place]  FDA  and has been authorized for detection and/or diagnosis of SARS-CoV-2 by FDA under an Emergency Use Authorization (EUA). This EUA will remain in effect (meaning this test can be used) for the duration of the COVID-19 declaration under Section 564(b)(1) of the Act, 21 U.S.C. section 360bbb-3(b)(1),  unless the authorization is terminated or revoked.     Resp Syncytial Virus by PCR NEGATIVE NEGATIVE Final    Comment: (NOTE) Fact Sheet for Patients: bloggercourse.com  Fact Sheet for Healthcare Providers: seriousbroker.it  This test is not yet approved or cleared by the [place]  FDA and has been authorized for detection and/or diagnosis of SARS-CoV-2 by FDA under an Emergency Use Authorization (EUA). This EUA will remain in effect (meaning this test can be used) for the duration of the COVID-19 declaration under Section 564(b)(1) of the Act, 21 U.S.C. section 360bbb-3(b)(1), unless the authorization is terminated or revoked.  Performed at [facility], [address], [place], [place] [address]   Body fluid culture w Gram Stain     Status: None   Collection Time: 01/27/24  3:30 PM   Specimen: PATH Cytology Peritoneal fluid  Result Value Ref Range Status   Specimen Description   Final    PERITONEAL Performed at [facility], [address], [place], [place] [address]    Special Requests   Final    NONE Performed at [facility], [address] [place], [place] [address]    Gram Stain   Final    WBC PRESENT, PREDOMINANTLY PMN NO ORGANISMS SEEN CYTOSPIN SMEAR    Culture   Final    NO GROWTH 4 DAYS Performed at [facility], [address], [place], [place] [address]    Report Status 01/31/2024 FINAL  Final  Culture, blood (routine x 2) Call MD if unable to obtain prior to antibiotics being given     Status: None (Preliminary result)   Collection Time: 01/30/24 10:49 PM   Specimen: BLOOD  Result Value Ref Range Status   Specimen Description BLOOD LEFT ANTECUBITAL  Final   Special Requests   Final    BOTTLES DRAWN AEROBIC AND ANAEROBIC Blood Culture adequate volume   Culture   Final    NO GROWTH < 12 HOURS Performed at [facility], [address], [place],  [place] [address]    Report Status PENDING  Incomplete  Culture, blood (routine x 2) Call MD if unable to obtain prior to antibiotics being given     Status: None (Preliminary result)   Collection Time: 01/30/24 10:58 PM   Specimen: BLOOD  Result Value Ref Range Status   Specimen Description BLOOD BLOOD LEFT HAND  Final   Special Requests   Final    BOTTLES DRAWN AEROBIC ONLY Blood Culture results may not be optimal due to an inadequate volume of blood received in culture bottles   Culture   Final    NO GROWTH < 12 HOURS Performed at [facility], [address] [place], [place] [address]    Report Status PENDING  Incomplete  MRSA Next Gen by PCR, Nasal     Status: None   Collection Time: 01/31/24  3:30 AM   Specimen: Nasal Mucosa; Nasal Swab  Result Value Ref Range Status   MRSA by PCR Next Gen NOT DETECTED NOT DETECTED Final    Comment: (NOTE) The GeneXpert MRSA Assay (FDA approved for NASAL specimens only), is one component of a comprehensive MRSA colonization surveillance program. It is not  intended to diagnose MRSA infection nor to guide or monitor treatment for MRSA infections. Test performance is not FDA approved in patients less than 31 years old. Performed at [facility], [address] [place] [place], [place] [address]    Studies/Results: MR BRAIN W WO CONTRAST Result Date: 01/31/2024 EXAM: MRI BRAIN WITH AND WITHOUT CONTRAST 01/31/2024 02:44:30 PM TECHNIQUE: Multiplanar multisequence MRI of the head/brain was performed with and without the administration of 6 mL gadobutrol  (GADAVIST ) 1 MMOL/ML injection. COMPARISON: CT head 01/30/2024. CLINICAL HISTORY: Neuro deficit, acute, stroke suspected. FINDINGS: BRAIN AND VENTRICLES: No acute infarct. No acute intracranial hemorrhage. No mass effect or midline shift. No hydrocephalus. The sella is unremarkable. Normal flow voids. No mass or abnormal enhancement. Mild chronic microvascular ischemic changes. Mild parenchymal  volume loss. ORBITS: No acute abnormality. SINUSES: Trace fluid in the right mastoid air cells. BONES AND SOFT TISSUES: Normal bone marrow signal and enhancement. There is mild edema adjacent to the right temporomandibular joint. Recommend correlation with tenderness at this level. IMPRESSION: 1. No acute intracranial abnormality. 2. Mild edema adjacent to the right temporomandibular joint. Recommend correlation with focal tenderness at this level. Electronically signed by: Donnice Mania MD 01/31/2024 03:19 PM EST RP Workstation: [id number]   DG Chest Port 1 View Result Date: 01/31/2024 EXAM: 1 VIEW(S) XRAY OF THE CHEST 01/31/2024 07:00:00 AM COMPARISON: 01/30/2024 CLINICAL HISTORY: Encounter for intubation. FINDINGS: LINES, TUBES AND DEVICES: Endotracheal tube in right mainstem bronchus. Right arm PICC stable in position. Feeding tube with tip in distal duodenum. LUNGS AND PLEURA: Low lung volumes. Left basilar airspace disease. Bilateral interstitial prominence. Small left pleural effusion. No pneumothorax. HEART AND MEDIASTINUM: No acute abnormality of the cardiac and mediastinal silhouettes. BONES AND SOFT TISSUES: Multiple chronic bilateral rib fractures. IMPRESSION: 1. Endotracheal tube malpositioned into the right mainstem bronchus; recommend retraction to appropriate position. 2. Left basilar airspace disease with bilateral interstitial prominence, which may reflect atelectasis and/or edema. 3. Small left pleural effusion. 4. Results were called to the ordering provider at the time of interpretation. I have personally spoken with Dr. Kathrene, who acknowledged these findings. Electronically signed by: Waddell Calk MD 01/31/2024 07:18 AM EST RP Workstation: [id number]   DG Chest Port 1 View Result Date: 01/30/2024 EXAM: 1 VIEW(S) XRAY OF THE CHEST 01/30/2024 09:01:18 PM COMPARISON: 01/22/2024 CLINICAL HISTORY: Tachypnea FINDINGS: LINES, TUBES AND DEVICES: Right PICC in place with tip at superior  cavoatrial junction. Enteric tube in place with tip below field of view. LUNGS AND PLEURA: Small bilateral pleural effusions. Mild pulmonary edema. Left basilar airspace opacity. No pneumothorax. HEART AND MEDIASTINUM: Aortic arch atherosclerosis. No acute abnormality of the cardiac and mediastinal silhouettes. BONES AND SOFT TISSUES: Chronic bilateral rib fractures. Right humeral head deformity. IMPRESSION: 1. Small bilateral pleural effusions and mild pulmonary edema. 2. Left basilar airspace opacity, which may reflect atelectasis or superimposed infection. Electronically signed by: Oneil Devonshire MD 01/30/2024 09:15 PM EST RP Workstation: [id number]   CT HEAD WO CONTRAST ( ) Result Date: 01/30/2024 EXAM: CT HEAD WITHOUT 01/30/2024 09:00:02 PM TECHNIQUE: CT of the head was performed without the administration of intravenous contrast. Automated exposure control, iterative reconstruction, and/or weight based adjustment of the mA/kV was utilized to reduce the radiation dose to as low as reasonably achievable. COMPARISON: 01/22/2024 CLINICAL HISTORY: Mental status change, unknown cause. FINDINGS: BRAIN AND VENTRICLES: No acute intracranial hemorrhage. No mass effect or midline shift. No extra-axial fluid collection. No evidence of acute infarct. Atrophy and chronic small vessel disease changes. ORBITS: No acute  abnormality. SINUSES AND MASTOIDS: No acute abnormality. SOFT TISSUES AND SKULL: No acute skull fracture. No acute soft tissue abnormality. IMPRESSION: 1. No acute intracranial abnormality. 2. Atrophy and chronic small vessel ischemic changes. Electronically signed by: Oneil Devonshire MD 01/30/2024 09:14 PM EST RP Workstation: [id number]   DG [place] MATSU Tube Plc W/Fl W/Rad Result Date: 01/30/2024 INDICATION: Malnutrition EXAM: NASO G TUBE PLACEMENT WITH FL AND WITH RAD FLUOROSCOPY TIME:  Radiation Exposure Index (as provided by the fluoroscopic device): 6.4 mGy Kerma COMPLICATIONS: None immediate PROCEDURE: The  Dobbhoff tube was lubricated with viscous lidocaine  inserted into the right nostril. Under intermittent fluoroscopic guidance, the Dobbhoff tube was advanced through the stomach, through the duodenum with tip ultimately terminating over the expected location of the duodenal - jejunal junction. A spot fluoroscopic image was saved for documentation purposes. The tube was affixed to the patient's nose with tape at approximately 78 cm. The patient tolerated the procedure well without immediate postprocedural complication. IMPRESSION: Successful fluoroscopic guided placement of Dobbhoff tube with tip terminating over the duodenojejunal junction. The tube is ready for immediate use. This exam was performed by Procedure performed by: Sherrilee Bal PA-C, and was supervised and interpreted by Dr. Ryan Chess. Electronically Signed   By: Ryan Chess M.D.   On: 01/30/2024 17:09   Medications: I have reviewed the patient's current medications. Scheduled Meds:  sodium chloride    Intravenous Once   vitamin C   500 mg Per Tube BID   [START ON 02/01/2024] Chlorhexidine  Gluconate Cloth  6 each Topical Q2200   [START ON 02/01/2024] feeding supplement (PROSource TF20)  60 mL Per Tube Daily   folic acid   1 mg Per Tube Daily   free water   30 mL Per Tube Q4H   furosemide   40 mg Intravenous Once   Gerhardt's butt cream   Topical BID   insulin  aspart  0-6 Units Subcutaneous Q4H   lactulose   30 g Per Tube BID   Or   lactulose   300 mL Rectal BID   midodrine   10 mg Per Tube TID WC   multivitamin with minerals  1 tablet Per Tube Daily   nystatin    Topical BID   mouth rinse  15 mL Mouth Rinse Q2H   pantoprazole  (PROTONIX ) IV  40 mg Intravenous Q12H   rifaximin   550 mg Per Tube BID   sodium chloride  flush  10-40 mL Intracatheter Q12H   Vitamin D  (Ergocalciferol )  50,000 Units Oral Q7 days   Continuous Infusions:  ampicillin -sulbactam (UNASYN ) IV 3 g (01/31/24 0906)   doxycycline  (VIBRAMYCIN ) IV 100 mg  (01/31/24 1113)   feeding supplement (VITAL 1.5 CAL)     norepinephrine  (LEVOPHED ) Adult infusion 5 mcg/min (01/31/24 1504)   thiamine  (VITAMIN B1) injection 110 mL/hr at 01/31/24 0835   PRN Meds:.dextrose , mouth rinse, prochlorperazine , sodium chloride  flush   Assessment: Principal Problem:   Steatohepatitis, severe Active Problems:   Hypokalemia due to excessive gastrointestinal loss of potassium   Hypoglycemia   AKI (acute kidney injury)   Anemia   Thrombocytopenia   Alcohol use disorder   Hypotension   Acute gastroenteritis   Hypoalbuminemia   Malnutrition of moderate degree   Acute respiratory failure (HCC)    Plan: This patient has alcoholic liver disease with cirrhosis and worsening bilirubin with dramatic decrease in the AST and ALT but a prolonged INR.  As previous notes have alluded to the patient has a very poor prognosis with this liver disease and continued drinking.  Patient's hemoglobin has  been stable.  I continue to recommend supportive care at this time.   LOS: 9 days   Rogelia Copping, MD.FACG 01/31/2024, 3:53 PM Pager [phone] 7am-5pm  Check AMION for 5pm -7am coverage and on weekends

## 2024-01-31 NOTE — Progress Notes (Addendum)
 Critical care note:  Date of note: 01/30/2024 and 01/31/2024  Subjective: The patient was seen in rapid response for altered mental status with hepatic encephalopathy, was decreased responsiveness, tachypnea and fever.  No reported nausea or vomiting.  She has been getting her tube feedings.  No reported chest pain or palpitations.  No focal lateralizing signs were noted.  Objective: Physical examination: Generally: Acutely ill elderly Caucasian female in mild respiratory distress.  She was nonverbal. Vital signs: Temperature 100.6 blood pressure 117/100 with heart rate of 110 and respiratory rate of 32 Oximetry is 9 9% on 2 L of O2 by nasal cannula. Head - atraumatic, normocephalic.  Pupils - equal, round and reactive to light and accommodation. Extraocular movements are intact. No scleral icterus.  Oropharynx - moist mucous membranes and tongue. No pharyngeal erythema or exudate.  Neck - supple. No JVD. Carotid pulses 2+ bilaterally. No carotid bruits. No palpable thyromegaly or lymphadenopathy. Cardiovascular - regular rate and rhythm. Normal S1 and S2. No murmurs, gallops or rubs.  Lungs -diminished bibasilar breath sounds with left basal crackles. Abdomen - soft and nontender. Positive bowel sounds. No palpable organomegaly or masses.  Extremities - no pitting edema, clubbing or cyanosis.  Neuro -The patient was nonverbal and not following commands.  Exam was otherwise grossly non-focal. Skin - no rashes. Breast, pelvic and rectal - deferred.   Chest x-ray showed small bilateral pleural effusions and mild pulmonary edema with left basilar opacities that may reflect atelectasis or superimposed infection.  Assessment/plan: 1.  Acute worsening encephalopathy with associated acute respiratory failure possibly related to aspiration pneumonia and in the setting of mild pulmonary edema and acute hepatic encephalopathy. - Stat head CT scan was ordered and it came back with no acute  intracranial abnormalities.  It showed atrophy and chronic small vessel ischemic disease. - The patient was placed on IV doxycycline  and Unasyn  and blood cultures were ordered. - She was given 20 mg of IV Lasix  given her mild pulmonary edema. - Given associated hepatic encephalopathy, Kayexalate  enema will be continued . - The patient continued with mental obtundation, tachycardia and tachypnea was transferred to stepdown.  Her ammonia level was 104 and potassium came back 3.1 with subsequent replacement with 40 mill equivalents IV potassium chloride .  Magnesium  came back 1.8 and she was given 2 g of IV magnesium  sulfate.  Repeat lactic acid was 3.8. - Given worsening hypotension she was thought to be developing septic shock that could be related to her pneumonia and therefore was started on p.o. midodrine  and later IV Levophed  and was transferred to the ICU team.  Authorized and performed by: Claire Peaches, MD Total critical care time:    45    minutes. Due to a high probability of clinically significant, life-threatening deterioration, the patient required my highest level of preparedness to intervene emergently and I personally spent this critical care time directly and personally managing the patient.  This critical care time included obtaining a history, examining the patient, pulse oximetry, ordering and review of studies, arranging urgent treatment with development of management plan, evaluation of patient's response to treatment, frequent reassessment, and discussions with other providers. This critical care time was performed to assess and manage the high probability of imminent, life-threatening deterioration that could result in multiorgan failure.  It was exclusive of separately billable procedures and treating other patients and teaching time.

## 2024-01-31 NOTE — Progress Notes (Signed)
 PT Cancellation Note  Patient Details Name: Claire Brown MRN: [id number] DOB: [birth year]   Cancelled Treatment:    Reason Eval/Treat Not Completed: Medical issues which prohibited therapy (Per chart review, patient transferred to CCU due to change in status; now intubated and sedated.  Per guidelines, will require new orders to resume care. Please reconsult as medically appropriate.)   Antonin Meininger H. Delores, PT, DPT, NCS 01/31/2024, 8:10 AM [phone]

## 2024-01-31 NOTE — Plan of Care (Signed)
   Problem: Education: Goal: Knowledge of General Education information will improve Description: Including pain rating scale, medication(s)/side effects and non-pharmacologic comfort measures Outcome: Not Progressing   Problem: Health Behavior/Discharge Planning: Goal: Ability to manage health-related needs will improve Outcome: Not Progressing   Problem: Clinical Measurements: Goal: Ability to maintain clinical measurements within normal limits will improve Outcome: Not Progressing Goal: Will remain free from infection Outcome: Not Progressing Goal: Diagnostic test results will improve Outcome: Not Progressing Goal: Respiratory complications will improve Outcome: Not Progressing Goal: Cardiovascular complication will be avoided Outcome: Not Progressing   Problem: Activity: Goal: Risk for activity intolerance will decrease Outcome: Not Progressing   Problem: Nutrition: Goal: Adequate nutrition will be maintained Outcome: Not Progressing   Problem: Coping: Goal: Level of anxiety will decrease Outcome: Not Progressing   Problem: Elimination: Goal: Will not experience complications related to bowel motility Outcome: Not Progressing Goal: Will not experience complications related to urinary retention Outcome: Not Progressing   Problem: Pain Managment: Goal: General experience of comfort will improve and/or be controlled Outcome: Not Progressing   Problem: Safety: Goal: Ability to remain free from injury will improve Outcome: Not Progressing   Problem: Skin Integrity: Goal: Risk for impaired skin integrity will decrease Outcome: Not Progressing   Problem: Activity: Goal: Ability to tolerate increased activity will improve Outcome: Not Progressing   Problem: Clinical Measurements: Goal: Ability to maintain a body temperature in the normal range will improve Outcome: Not Progressing   Problem: Respiratory: Goal: Ability to maintain adequate ventilation will  improve Outcome: Not Progressing Goal: Ability to maintain a clear airway will improve Outcome: Not Progressing

## 2024-01-31 NOTE — Progress Notes (Signed)
 OT Cancellation Note  Patient Details Name: Claire Brown MRN: [id number] DOB: [birth year]   Cancelled Treatment:    Reason Eval/Treat Not Completed: Medical issues which prohibited therapy. Per chart review, pt needing higher level of care and transferred to ICU. Will need new orders when appropriate to be seen by therapy.   Izetta Claude, MS, OTR/L , CBIS ascom [phone]  01/31/2024, 9:23 AM

## 2024-02-01 DIAGNOSIS — E861 Hypovolemia: Secondary | ICD-10-CM | POA: Diagnosis not present

## 2024-02-01 DIAGNOSIS — F1090 Alcohol use, unspecified, uncomplicated: Secondary | ICD-10-CM | POA: Diagnosis not present

## 2024-02-01 DIAGNOSIS — Z515 Encounter for palliative care: Secondary | ICD-10-CM | POA: Diagnosis not present

## 2024-02-01 DIAGNOSIS — K7581 Nonalcoholic steatohepatitis (NASH): Secondary | ICD-10-CM | POA: Diagnosis not present

## 2024-02-01 LAB — BASIC METABOLIC PANEL WITH GFR
Anion gap: 13 (ref 5–15)
BUN: 7 mg/dL — ABNORMAL LOW (ref 8–23)
CO2: 19 mmol/L — ABNORMAL LOW (ref 22–32)
Calcium: 8.6 mg/dL — ABNORMAL LOW (ref 8.9–10.3)
Chloride: 113 mmol/L — ABNORMAL HIGH (ref 98–111)
Creatinine, Ser: 1.11 mg/dL — ABNORMAL HIGH (ref 0.44–1.00)
GFR, Estimated: 56 mL/min — ABNORMAL LOW (ref >60)
Glucose, Bld: 149 mg/dL — ABNORMAL HIGH (ref 70–99)
Potassium: 2.8 mmol/L — ABNORMAL LOW (ref 3.5–5.1)
Sodium: 145 mmol/L (ref 135–145)

## 2024-02-01 LAB — CBC
HCT: 25.2 % — ABNORMAL LOW (ref 36.0–46.0)
Hemoglobin: 8.8 g/dL — ABNORMAL LOW (ref 12.0–15.0)
MCH: 35.5 pg — ABNORMAL HIGH (ref 26.0–34.0)
MCHC: 34.9 g/dL (ref 30.0–36.0)
MCV: 101.6 fL — ABNORMAL HIGH (ref 80.0–100.0)
Platelets: 21 K/uL — CL (ref 150–400)
RBC: 2.48 MIL/uL — ABNORMAL LOW (ref 3.87–5.11)
RDW: 25.6 % — ABNORMAL HIGH (ref 11.5–15.5)
WBC: 13.6 K/uL — ABNORMAL HIGH (ref 4.0–10.5)
nRBC: 0.2 % (ref 0.0–0.2)

## 2024-02-01 LAB — HEPATIC FUNCTION PANEL
ALT: 26 U/L (ref 0–44)
AST: 35 U/L (ref 15–41)
Albumin: 2.9 g/dL — ABNORMAL LOW (ref 3.5–5.0)
Alkaline Phosphatase: 141 U/L — ABNORMAL HIGH (ref 38–126)
Bilirubin, Direct: 7.8 mg/dL — ABNORMAL HIGH (ref 0.0–0.2)
Indirect Bilirubin: 3.8 mg/dL — ABNORMAL HIGH (ref 0.3–0.9)
Total Bilirubin: 11.6 mg/dL — ABNORMAL HIGH (ref 0.0–1.2)
Total Protein: 4.3 g/dL — ABNORMAL LOW (ref 6.5–8.1)

## 2024-02-01 LAB — AMMONIA: Ammonia: 176 umol/L — ABNORMAL HIGH (ref 9–35)

## 2024-02-01 LAB — PROTIME-INR
INR: 4 — ABNORMAL HIGH (ref 0.8–1.2)
Prothrombin Time: 40.6 s — ABNORMAL HIGH (ref 11.4–15.2)

## 2024-02-01 LAB — GLUCOSE, CAPILLARY
Glucose-Capillary: 117 mg/dL — ABNORMAL HIGH (ref 70–99)
Glucose-Capillary: 130 mg/dL — ABNORMAL HIGH (ref 70–99)
Glucose-Capillary: 133 mg/dL — ABNORMAL HIGH (ref 70–99)
Glucose-Capillary: 144 mg/dL — ABNORMAL HIGH (ref 70–99)
Glucose-Capillary: 144 mg/dL — ABNORMAL HIGH (ref 70–99)
Glucose-Capillary: 151 mg/dL — ABNORMAL HIGH (ref 70–99)

## 2024-02-01 LAB — LACTIC ACID, PLASMA: Lactic Acid, Venous: 4.1 mmol/L (ref 0.5–1.9)

## 2024-02-01 LAB — POTASSIUM: Potassium: 3.1 mmol/L — ABNORMAL LOW (ref 3.5–5.1)

## 2024-02-01 LAB — PHOSPHORUS
Phosphorus: 1.8 mg/dL — ABNORMAL LOW (ref 2.5–4.6)
Phosphorus: 2.9 mg/dL (ref 2.5–4.6)

## 2024-02-01 LAB — MAGNESIUM: Magnesium: 2.2 mg/dL (ref 1.7–2.4)

## 2024-02-01 MED ORDER — PHYTONADIONE 5 MG PO TABS
2.5000 mg | ORAL_TABLET | Freq: Every day | ORAL | Status: DC
  Administered 2024-02-01 – 2024-02-03 (×3): 2.5 mg
  Filled 2024-02-01 (×3): qty 1

## 2024-02-01 MED ORDER — POTASSIUM PHOSPHATES 15 MMOLE/5ML IV SOLN
30.0000 mmol | Freq: Once | INTRAVENOUS | Status: AC
  Administered 2024-02-01: 10:00:00 30 mmol via INTRAVENOUS
  Filled 2024-02-01: qty 10

## 2024-02-01 MED ORDER — POTASSIUM CHLORIDE 10 MEQ/50ML IV SOLN
10.0000 meq | INTRAVENOUS | Status: AC
  Administered 2024-02-01 – 2024-02-02 (×4): 10 meq via INTRAVENOUS
  Filled 2024-02-01 (×4): qty 50

## 2024-02-01 MED ORDER — POTASSIUM CHLORIDE 20 MEQ PO PACK
20.0000 meq | PACK | Freq: Once | ORAL | Status: AC
  Administered 2024-02-01: 07:00:00 20 meq
  Filled 2024-02-01: qty 1

## 2024-02-01 MED ORDER — POTASSIUM CHLORIDE 20 MEQ PO PACK
40.0000 meq | PACK | Freq: Once | ORAL | Status: AC
  Administered 2024-02-01: 23:00:00 40 meq
  Filled 2024-02-01: qty 2

## 2024-02-01 MED ORDER — PROPOFOL 1000 MG/100ML IV EMUL
0.0000 ug/kg/min | INTRAVENOUS | Status: DC
  Administered 2024-02-01 (×2): 20 ug/kg/min via INTRAVENOUS
  Administered 2024-02-02: 22:00:00 30 ug/kg/min via INTRAVENOUS
  Administered 2024-02-02: 12:00:00 20 ug/kg/min via INTRAVENOUS
  Administered 2024-02-03 (×2): 30 ug/kg/min via INTRAVENOUS
  Filled 2024-02-01 (×6): qty 100

## 2024-02-01 MED ORDER — THIAMINE MONONITRATE 100 MG PO TABS
100.0000 mg | ORAL_TABLET | Freq: Every day | ORAL | Status: DC
  Administered 2024-02-02 – 2024-02-03 (×2): 100 mg
  Filled 2024-02-01 (×2): qty 1

## 2024-02-01 MED ORDER — POTASSIUM & SODIUM PHOSPHATES 280-160-250 MG PO PACK
2.0000 | PACK | ORAL | Status: DC
  Administered 2024-02-01: 07:00:00 2
  Filled 2024-02-01: qty 2

## 2024-02-01 NOTE — Plan of Care (Signed)
°  Problem: Nutrition: Goal: Adequate nutrition will be maintained Outcome: Progressing   Problem: Elimination: Goal: Will not experience complications related to bowel motility Outcome: Progressing Goal: Will not experience complications related to urinary retention Outcome: Progressing   Problem: Pain Managment: Goal: General experience of comfort will improve and/or be controlled Outcome: Progressing   Problem: Respiratory: Goal: Ability to maintain adequate ventilation will improve Outcome: Progressing Goal: Ability to maintain a clear airway will improve Outcome: Progressing

## 2024-02-01 NOTE — Progress Notes (Signed)
 Palliative Care Progress Note, Assessment & Plan   Patient Name: Claire Brown       Date: 02/01/2024 DOB: [birth year]  Age: 63 y.o. MRN#: [id number] Attending Physician: Isaiah Scrivener, MD Primary Care Physician: Pcp, No Admit Date: 01/22/2024  Subjective: Patient is lying in bed on ventilatory support without sedation.  Her eyes are open but she does not track me.  She does not respond to verbal stimuli.  No family or friends are present at bedside during my visit.  HPI: 63 y.o. female  with past medical history of bipolar disorder, cirrhosis and ETOH and tobacco use admitted from home on 01/22/2024 with complaints of N/V/D and associated poor PO intake x 1 week.   In ED found to have significantly elevated liver enzymes--now normalized, elevated bilirubin and INR Found to have severe steatohepatitis, GI consulted and following close Anemic-requiring blood transfusions Encephalopathic--CIWA protocol  Early in the morning on 12/16, patient experienced acute respiratory failure possibly related to aspiration pneumonia.  Stat CT at that time was ordered and was negative for acute intracranial abnormalities.  Patient continued to be obtunded, tachycardic, and tachypneic.  She was transferred to stepdown for closer monitoring.  However, she became more hypotensive-developing septic shock-was subsequently intubated/placed on vent.   Palliative medicine was consulted for assisting with goals of care conversations.  Summary of counseling/coordination of care: Extensive chart review completed prior to meeting patient including labs, vital signs, imaging, progress notes, orders, and available advanced directive documents from current and previous encounters.   After reviewing the patient's chart and assessing  the patient at bedside, she remains that patient is unable to participate in goals of care or medical decision making independently at this time.  After visiting with the patient, I attempted to clarify who her next of kin/surrogate decision maker would be.  I counseled with TOC who advised there are 2 sons - [place] and [place] - and 1 daughter- Raelee- referenced in patient's chart from visits years ago.    Call made to [name].  No answer.  HIPAA compliant voicemail left with PMT contact info given.  Call made to patient's (alleged) son Adina.  Adina shares that he is not patient's biological son but rather her stepson.  He shares he has not been involved in patient's life for over 7 years.  He does not wish to be updated or have knowledge of her current medical situation.  He confirms that patient has 1 biological child-Colt but has no contact with him.  Later in the day, [name] returned my phone call. Medical update given and course of hospitalization discussed. I highlighted that patient is unable to make her wishes known at this time. Therefore, the medical team is looking to her next of kin to help guide decisions for her care.   He shares that he has not spoken with his mother in over 7 years. [name] is unsure if he wishes to reengage with his mother at this time.    I confirmed that [name] is patient's only biological child and only living relative.  He shares that [name] and is unsure if he wishes for [name] to step in his  surrogate decision maker for his mother. He is appreciative the phone call and the update but politely asks for us  to end our conversation to give him some time to think.    He has PMT contact info and assured me he would give me a callback to further discuss whether or not he would like to be her surrogate decision maker.  Above conveyed to [facility], RN, and CCM NP [facility] Dr. Isaiah.  Advised that no definitive changes to patient's medical  treatment plans should be made at this time as her next next of kin/son is deciding on whether or not he would like to step into the role of decision maker for his mother.  No change to plan of care at this time.  Full code and full scope remain.  I plan to follow-up with [facility] when he is ready to further discuss his mother's care.   Physical Exam Vitals reviewed.  Constitutional:      General: She is not in acute distress.    Appearance: She is ill-appearing.  HENT:     Head: Normocephalic.  Cardiovascular:     Rate and Rhythm: Tachycardia present.  Pulmonary:     Comments: Mechanical ventilatory support Abdominal:     Palpations: Abdomen is soft.  Skin:    Coloration: Skin is jaundiced.  Neurological:     Comments: Off sedation but not able to engage in orientation assessment Nonverbal Not responsive to verbal stimuli              Recommendations:   Full code remains No change to plan of care until son has responded - awaiting response from son to see if he wishes to be surrogate decision maker  50 minute visit includes: Detailed review of medical records (labs, imaging, vital signs), medically appropriate exam (mental status, respiratory, cardiac, skin), discussed with treatment team, counseling and educating patient, family and staff, documenting clinical information, medication management and coordination of care.  Lamarr L. Arvid, DNP, FNP-BC Palliative Medicine Team

## 2024-02-01 NOTE — Procedures (Signed)
 Routine EEG Report  Claire Brown is a 63 y.o. female with a history of altered mental status and unresponsiveness who is undergoing an EEG to evaluate for seizures.  Report: This EEG was acquired with electrodes placed according to the International 10-20 electrode system (including Fp1, Fp2, F3, F4, C3, C4, P3, P4, O1, O2, T3, T4, T5, T6, A1, A2, Fz, Cz, Pz). The following electrodes were missing or displaced: none.  There was no clear waking rhythm. The background consisted of burst suppression pattern with on average 2 seconds of delta slowing with 1-2 superimposed triphasic waves alternating with periods of relative suppression lasting up to 5 seconds. This activity was reactive to stimulation. There was no focal slowing. Occasional sleep spindles were noted. There were no interictal epileptiform discharges. There were no electrographic seizures identified. Photic stimulation and hyperventilation were not performed.  Impression and clinical correlation: This EEG was obtained while comatose (not on sedation) and is abnormal due to:  - Burst suppression pattern in the absence of sedation with delta slowing during bursts -  indicative of severe cerebral dysfunction - Triphasic waves - indicative of metabolic encephalopathy  Epileptiform abnormalities were not seen during this recording.  Claire Ross, MD [facility] [phone]  If 7pm- 7am, please page neurology on call as listed in AMION.

## 2024-02-01 NOTE — IPAL (Signed)
°  Interdisciplinary Goals of Care Family Meeting   Date carried out: 02/01/2024  Location of the meeting: Bedside  Member's involved: Nurse Practitioner and Family Member or next of kin  Durable Power of Attorney or acting medical decision maker: Pt's son Claire Brown  Discussion: We discussed goals of care for Claire Brown .  Reviewed clinical course including admitted with severe Steatohepatitis due to ETOH, AKI with multiple electrolyte derangements, and hypoglycemia. Course complicated by worsening Hepatic Encephalopathy and Acute Hypoxic Respiratory Failure due to suspected aspiration requiring intubation and mechanical ventilation.   Discussed very poor prognosis and high risk for mortality given liver disease with continued drinking, now superimposed on multiorgan failure.  Attempted to elicit goals of care. We discussed code status, Full Code versus Do Not Resuscitate, Encouraged patient to consider DNR/DNI status understanding evidenced based poor outcomes in similar hospitalized patients, as the cause of the arrest is likely associated with chronic/terminal disease rather than a reversible acute cardio-pulmonary event    Claire understands her prognosis.  He is in agreement with continuing to treat the treatable and allowing time for outcomes, but should she cardiac arrest would NOT want us  to perform CPR and to allow for a natural death.  If she should decline in the interim, we would reconvene and likely shift to comfort measures.   Code status:   Code Status: Limited: Do not attempt resuscitation (DNR) -DNR-LIMITED -Do Not Intubate/DNI    Disposition: Continue current acute care  Time spent for the meeting: 15 minutes   Claire Brown, AGACNP-BC [facility] Prefer epic messenger for cross cover needs If after hours, please call E-link  Claire JONETTA Lecher, NP  02/01/2024, 4:38 PM

## 2024-02-01 NOTE — Progress Notes (Signed)
 The patient was seen and appears critically ill with a very poor prognosis.  Due to the advanced liver disease and respiratory issues the patient was admitted to the ICU.  At this point supportive care is all GI can offer and there is not much more intervention that will change this patient's outcome at this time.

## 2024-02-01 NOTE — Plan of Care (Signed)
°  Problem: Clinical Measurements: Goal: Cardiovascular complication will be avoided Outcome: Progressing   Problem: Nutrition: Goal: Adequate nutrition will be maintained Outcome: Progressing   Problem: Education: Goal: Knowledge of General Education information will improve Description: Including pain rating scale, medication(s)/side effects and non-pharmacologic comfort measures Outcome: Not Progressing   Problem: Health Behavior/Discharge Planning: Goal: Ability to manage health-related needs will improve Outcome: Not Progressing   Problem: Clinical Measurements: Goal: Diagnostic test results will improve Outcome: Not Progressing Goal: Respiratory complications will improve Outcome: Not Progressing   Problem: Activity: Goal: Risk for activity intolerance will decrease Outcome: Not Progressing   Problem: Coping: Goal: Level of anxiety will decrease Outcome: Not Progressing

## 2024-02-01 NOTE — Progress Notes (Signed)
 PHARMACY CONSULT NOTE - FOLLOW UP  Pharmacy Consult for Electrolyte Monitoring and Replacement   Recent Labs: Potassium (mmol/L)  Date Value  02/01/2024 2.8 (L)   Magnesium  (mg/dL)  Date Value  87/82/7974 2.2   Calcium (mg/dL)  Date Value  87/82/7974 8.6 (L)   Albumin  (g/dL)  Date Value  87/82/7974 2.9 (L)   Phosphorus (mg/dL)  Date Value  87/82/7974 1.8 (L)   Sodium (mmol/L)  Date Value  02/01/2024 145     Assessment: 63 y/o female with h/o etoh abuse, ovarian mass, anxiety, bipolar disorder and hiatal hernia who is admitted with steatohepatitis and new cirrhosis complicated by AMS, septic shock and aspiration PNA. Pharmacy is asked to follow and replace electrolytes while in CCU  Goal of Therapy:  Electrolytes WNL  Plan:  ---30 mmol IV potassium phosphate  x 1 (contains 44 mEq IV potassium) ---20 mEq KCl per tube x 1 per NP  Claire Brown ,PharmD Clinical Pharmacist 02/01/2024 7:09 AM

## 2024-02-01 NOTE — Progress Notes (Signed)
 NAME:  Claire Brown, MRN:  [id number], DOB:  [birth year], LOS: 10 ADMISSION DATE:  01/22/2024, CONSULTATION DATE:  01/31/2024 REFERRING MD:  Dr. Lawence, CHIEF COMPLAINT:  Altered Mental Status   Brief Pt Description / Synopsis:  63 yo female with PMHx significant for alcohol abuse and bipolar mood disorder who is admitted with severe Steatohepatitis due to ETOH, AKI with multiple electrolyte derangements, and hypoglycemia.  Course complicated by worsening Hepatic Encephalopathy and Acute Hypoxic Respiratory Failure due to suspected aspiration requiring intubation and mechanical ventilation.   History of Present Illness:  Patient is a 63 yo F w/ pertinent PMH Bipolar, anxiety, Cirrhosis, ETOH and tobacco abuse presents [facility] on 12/7 w/ electrolytes derangement, AKI, steatohepatitis, hypoglycemia.   Patient w/ 1 week of vomiting. Came to [facility] on 12/7. LFTs markedly elevated and t bili 2.7. CT abd/pelvis possible infectious coitis and severe hepatic steatosis; possible gastritis. GI consulted and following. Started on PPI. Home lactulose  and rifaximin  resumed. Patient also w/ AKI, hypoglycemia, and electrolyte derangements. Electrolytes repleted and given iv fluids. Given hx of alcohol use CIWA protocol in place. On 12/12 started on rocephin /flagyl  and paracentesis performed w/ 1.6 L of fluid removed which is negative for SBP.    On 12/15 Rapid response called overnight. Tmax 100.6 F and wbc trending up to 12.1. Cultures obtained and abx broadened. Overnight developed respiratory distress and became altered. CT head no acute abnormality. Concerned for aspiration pna vs. Pulmonary edema. Given lasix . Developed worsening hypotension started on levo and brought to icu. Worsening sob and ams patient intubated. PCCM consulted.  Please see Significant Hospital Events section below for full detailed hospital course.   Pertinent  Medical History   Past Medical History:  Diagnosis Date   Anemia     Anxiety    Arthritis    Bipolar disorder (HCC)    Headache     Micro Data:  12/7: COVID/FLU/RSV PCR>> negative 12/7: Acute Hepatitis panel>> nonreactive 12/12: Peritoneal fluid>> no growth to date 12/15: Blood cultures x2>> no growth to date 12/16: MRSA PCR>> negative  Antimicrobials:   Anti-infectives (From admission, onward)    Start     Dose/Rate Route Frequency Ordered Stop   01/30/24 2230  Ampicillin -Sulbactam (UNASYN ) 3 g in sodium chloride  0.9 % 100 mL IVPB        3 g 200 mL/hr over 30 Minutes Intravenous Every 6 hours 01/30/24 2141     01/30/24 2230  doxycycline  (VIBRAMYCIN ) 100 mg in sodium chloride  0.9 % 250 mL IVPB        100 mg 125 mL/hr over 120 Minutes Intravenous Every 12 hours 01/30/24 2141     01/30/24 1330  rifaximin  (XIFAXAN ) tablet 550 mg        550 mg Per Tube 2 times daily 01/30/24 1230     01/27/24 1800  cefTRIAXone  (ROCEPHIN ) 2 g in sodium chloride  0.9 % 100 mL IVPB  Status:  Discontinued        2 g 200 mL/hr over 30 Minutes Intravenous Every 24 hours 01/27/24 1645 01/30/24 2141   01/27/24 1800  metroNIDAZOLE  (FLAGYL ) IVPB 500 mg  Status:  Discontinued        500 mg 100 mL/hr over 60 Minutes Intravenous Every 12 hours 01/27/24 1645 01/28/24 1155   01/27/24 1245  cefTRIAXone  (ROCEPHIN ) 2 g in sodium chloride  0.9 % 100 mL IVPB  Status:  Discontinued        2 g 200 mL/hr over 30 Minutes Intravenous Every 24  hours 01/27/24 1145 01/27/24 1628       Significant Hospital Events: Including procedures, antibiotic start and stop dates in addition to other pertinent events   12/7 admit 12/12: INR 3.8, elevated, vitamin K 10 mg subcu x 1 dose given as per GI.  Monitor INR. pharmacy consulted to start ceftriaxone  and Flagyl   for possible intra-abdominal infection 12/13 d/w GI, less likely acute generalized colitis as there are no symptoms and findings could be secondary to liver cirrhosis.  Discontinued Flagyl  12/15 worsening ams and sob; intubated 12/16: MRI  Brain negative.  EEG obtained  12/17: No acute events overnight, remains intubated on no sedation and unresponsive, mental status precludes extubation.  EEG resulted with burst suppression pattern indicative of severe cerebral dysfunction and metabolic encephalopathy.  Interim History / Subjective:  As outlined above under Significant Hospital Events section  Objective   Blood pressure (!) 89/63, pulse (!) 104, temperature 97.9 F (36.6 C), temperature source Axillary, resp. rate (!) 21, height 5' (1.524 m), weight 61 kg, SpO2 96%.    Vent Mode: PRVC FiO2 (%):  [35 %-40 %] 35 % Set Rate:  [18 bmp] 18 bmp Vt Set:  [400 mL] 400 mL PEEP:  [5 cmH20] 5 cmH20   Intake/Output Summary (Last 24 hours) at 02/01/2024 9161 Last data filed at 02/01/2024 9247 Gross per 24 hour  Intake 2097.53 ml  Output 170 ml  Net 1927.53 ml   Filed Weights   01/31/24 0044 01/31/24 0459 02/01/24 0500  Weight: 60.1 kg 60.1 kg 61 kg    Examination: General: Critically ill appearing female, laying in bed, intubated, on NO sedation, in NAD HENT: Atraumatic, normocephalic, neck supple, no JVD, orally intubated, scleral icterus Lungs: Mechanical breath sounds throughout, even, overbreathing the vent Cardiovascular: RRR, s1s2, no M/R/G Abdomen: Obese, soft, slightly distended, no guarding or rebound tenderness, BS+ x4 Extremities: warm/dry, trace edema, jaundice Neuro: Unresponsive, + cough/gag reflex, PERRL  GU: Foley catheter in place draining yellow urine   Resolved Hospital Problem list     Assessment & Plan:   #Acute Hepatic Encephalopathy  #Alcohol use disorder  -CT head negative; MRI Brain negative -EEG with  burst suppression pattern indicative of severe cerebral dysfunction and metabolic encephalopathy. -Maintain a RASS goal of 0 to -1 -Currently unresponsive on NO sedation -Avoid sedating medications as able -Daily wake up assessment -Continue high dose thiamine , Folic acid , MVI -Continue  Lactulose  and Rifaximin  -Trend Ammonia   #Acute Respiratory Failure #Concern for possible Aspiration Pneumonia -Full vent support, implement lung protective strategies -Plateau pressures less than 30 cm H20 -Wean FiO2 & PEEP as tolerated to maintain O2 sats >92% -Follow intermittent Chest X-ray & ABG as needed -Spontaneous Breathing Trials when respiratory parameters met and mental status permits -Implement VAP Bundle -Prn Bronchodilators -ABX as above  #Acute Hepatitis 2/2 ETOH abuse #Acute on Chronic Transaminitis #Liver Cirrhosis with Ascites #Hypoalbuminemia -Status post Paracentesis 12/12: removed 1.6 L fluid, negative for SBP -GI following, appreciate input ~ recommends supportive care -Trend LFT's, INR, ammonia -Continue Lactulose  and Rifaximin   #Shock -Continuous cardiac monitoring -Maintain MAP >60 -Albumin   -Vasopressors as needed to maintain MAP goal -Trend lactic acid until normalized -Trend HS Troponin until peaked -Echocardiogram pending -Diuresis as BP and renal function permits  #AKI #Hypokalemia #Hypophosphatemia -Monitor I&O's / urinary output -Follow BMP -Ensure adequate renal perfusion -Avoid nephrotoxic agents as able -Replace electrolytes as indicated ~ Pharmacy following for assistance with electrolyte replacement  #Anemia  #Thrombocytopenia -Monitor for S/Sx of bleeding -Trend  CBC -SCD's for VTE Prophylaxis  -Transfuse for Hgb <7 -Transfuse Platelets for Platelet count < 10K; < 50K with active bleeding; < 100K with Neurosurgical procedures/processes      Pt is critically ill with multiorgan failure. Prognosis is guarded, high risk for further decompensation, cardiac arrest, and death.  Given current critical illness superimposed on multiple chronic co-morbidities and advanced age, overall long term prognosis is poor.  Recommend consideration of DNR/DNI status.  Will consult Palliative Care to assist with GOC discussions.   Best Practice  (right click and Reselect all SmartList Selections daily)   Diet/type: tubefeeds and NPO DVT prophylaxis: SCD GI prophylaxis: PPI Lines: Central line and yes and it is still needed Foley:  Yes, and it is still needed Code Status:  full code Last date of multidisciplinary goals of care discussion [12/17]  12/17: Will update pt's family when they arrive at bedside on plan of care.  Labs   CBC: Recent Labs  Lab 01/28/24 1845 01/29/24 0528 01/30/24 2031 01/31/24 0429 02/01/24 0407  WBC 8.7 9.5 10.1 12.1* 13.6*  NEUTROABS 6.4  --   --   --   --   HGB 10.6* 10.2* 10.3* 9.8* 8.8*  HCT 30.1* 28.5* 28.2* 27.3* 25.2*  MCV 100.0 99.3 98.3 100.4* 101.6*  PLT 17* 14* 32* 26* 21*    Basic Metabolic Panel: Recent Labs  Lab 01/27/24 0429 01/28/24 0600 01/29/24 0528 01/30/24 0500 01/30/24 2031 01/30/24 2249 01/31/24 0429 02/01/24 0407  NA 133* 135 140  --  143  --  146* 145  K 3.1* 3.7 3.6  --  3.1*  --  3.8 2.8*  CL 101 107 109  --  115*  --  115* 113*  CO2 20* 22 20*  --  17*  --  16* 19*  GLUCOSE 94 318* 105*  --  123*  --  168* 149*  BUN 6* 6* 6*  --  7*  --  7* 7*  CREATININE 0.78 0.84 0.71  --  0.91  --  1.04* 1.11*  CALCIUM 7.6* 7.5* 7.9*  --  8.6*  --  8.5* 8.6*  MG 1.9 1.7 1.7 1.7  --  1.8 2.4 2.2  PHOS 3.1 3.2  --  2.8  --   --  2.3* 1.8*   GFR: Estimated Creatinine Clearance: 42.9 mL/min (A) (by C-G formula based on SCr of 1.11 mg/dL (H)). Recent Labs  Lab 01/29/24 0528 01/30/24 2031 01/30/24 2031 01/31/24 0050 01/31/24 0429 01/31/24 0610 02/01/24 0407  PROCALCITON  --   --   --   --   --  1.33  --   WBC 9.5 10.1  --   --  12.1*  --  13.6*  LATICACIDVEN  --  3.3*   < > 3.8* 5.0* 4.8* 4.1*   < > = values in this interval not displayed.    Liver Function Tests: Recent Labs  Lab 01/28/24 0600 01/29/24 0528 01/30/24 2031 01/31/24 0429 02/01/24 0407  AST 47* 46* 37 38 35  ALT 48* 46* 37 34 26  ALKPHOS 153* 161* 160* 145* 141*  BILITOT 8.3* 10.2*  12.7* 11.8* 11.6*  PROT 3.5* 3.7* 4.1* 3.9* 4.3*  ALBUMIN  2.6* 2.5* 2.7* 2.6* 2.9*   No results for input(s): LIPASE, AMYLASE in the last 168 hours. Recent Labs  Lab 01/28/24 1142 01/30/24 2308 01/31/24 0848 02/01/24 0407  AMMONIA 100* 104* 97* 176*    ABG    Component Value Date/Time   PHART 7.4 01/31/2024  0814   PCO2ART 30 (L) 01/31/2024 0814   PO2ART 84 01/31/2024 0814   HCO3 18.6 (L) 01/31/2024 0814   ACIDBASEDEF 5.0 (H) 01/31/2024 0814   O2SAT 97.3 01/31/2024 0814     Coagulation Profile: Recent Labs  Lab 01/27/24 0733 01/28/24 0500 01/29/24 0559 01/31/24 0848 02/01/24 0407  INR 3.8* 3.7* 3.2* 3.5* 4.0*    Cardiac Enzymes: No results for input(s): CKTOTAL, CKMB, CKMBINDEX, TROPONINI in the last 168 hours.  HbA1C: No results found for: HGBA1C  CBG: Recent Labs  Lab 01/31/24 1604 01/31/24 1925 01/31/24 2326 02/01/24 0308 02/01/24 0736  GLUCAP 114* 137* 171* 144* 133*    Review of Systems:   Unable to assess due to intubation/AMS/critical illness   Past Medical History:  She,  has a past medical history of Anemia, Anxiety, Arthritis, Bipolar disorder (HCC), and Headache.   Surgical History:   Past Surgical History:  Procedure Laterality Date   CESAREAN SECTION     CYSTOSCOPY N/A 10/14/2015   Procedure: CYSTOSCOPY;  Surgeon: Glory High, MD;  Location: [facility] ORS;  Service: Gynecology;  Laterality: N/A;   FRACTURE SURGERY Left 12/2015   Femoral Neck Pin Placement - Dr. Madelynn ([facility])   KNEE SURGERY Bilateral    LAPAROSCOPIC BILATERAL SALPINGO OOPHERECTOMY  10/14/2015   Procedure: LAPAROSCOPIC BILATERAL SALPINGO OOPHORECTOMY;  Surgeon: Glory High, MD;  Location: [facility] ORS;  Service: Gynecology;;   LAPAROSCOPIC HYSTERECTOMY N/A 10/14/2015   Procedure: HYSTERECTOMY TOTAL LAPAROSCOPIC;  Surgeon: Glory High, MD;  Location: [facility] ORS;  Service: Gynecology;  Laterality: N/A;     Social History:   reports that she has  been smoking cigarettes. She has never used smokeless tobacco. She reports current alcohol use. She reports that she does not use drugs.   Family History:  Her family history includes Cancer in her brother.   Allergies Allergies[1]   Home Medications  Prior to Admission medications  Medication Sig Start Date End Date Taking? Authorizing Provider  acetaminophen  (TYLENOL ) 325 MG tablet Take 650 mg by mouth every 12 (twelve) hours as needed for mild pain (pain score 1-3) or moderate pain (pain score 4-6).   Yes [provider]  dimenhyDRINATE (DRAMAMINE) 50 MG tablet Take 50 mg by mouth every 8 (eight) hours as needed.   Yes [provider]  diphenhydrAMINE  (BENADRYL ) 25 MG tablet Take 25 mg by mouth every 6 (six) hours as needed for allergies.   Yes [provider]  Multiple Vitamin (MULTIVITAMIN WITH MINERALS) TABS tablet Take 1 tablet by mouth daily.   Yes [provider]  Potassium Gluconate 550 MG TABS Take 550 mg by mouth every morning.   Yes [provider]     Critical care time: 40 minutes     Inge Lecher, AGACNP-BC [facility] Prefer epic messenger for cross cover needs If after hours, please call E-link       [1]  Allergies Allergen Reactions   Amoxicillin-Pot Clavulanate Nausea Only and Nausea And Vomiting   Oxycodone  Anxiety, Other (See Comments) and Dermatitis    Reaction: jittery/ unable to sleep   Penicillins Itching, Other (See Comments) and Hives    Patient cannot remember the specifics of the reaction. Has taken Amoxicillin since with no problem.   Diazepam Anxiety, Other (See Comments) and Nausea Only    Reaction: unable to sleep

## 2024-02-02 ENCOUNTER — Inpatient Hospital Stay

## 2024-02-02 DIAGNOSIS — J9690 Respiratory failure, unspecified, unspecified whether with hypoxia or hypercapnia: Secondary | ICD-10-CM

## 2024-02-02 LAB — MRSA NEXT GEN BY PCR, NASAL: MRSA by PCR Next Gen: NOT DETECTED

## 2024-02-02 LAB — HEPATIC FUNCTION PANEL
ALT: 26 U/L (ref 0–44)
AST: 43 U/L — ABNORMAL HIGH (ref 15–41)
Albumin: 2.6 g/dL — ABNORMAL LOW (ref 3.5–5.0)
Alkaline Phosphatase: 172 U/L — ABNORMAL HIGH (ref 38–126)
Bilirubin, Direct: 9.2 mg/dL — ABNORMAL HIGH (ref 0.0–0.2)
Indirect Bilirubin: 4.4 mg/dL — ABNORMAL HIGH (ref 0.3–0.9)
Total Bilirubin: 13.5 mg/dL — ABNORMAL HIGH (ref 0.0–1.2)
Total Protein: 4.3 g/dL — ABNORMAL LOW (ref 6.5–8.1)

## 2024-02-02 LAB — BASIC METABOLIC PANEL WITH GFR
Anion gap: 15 (ref 5–15)
BUN: 7 mg/dL — ABNORMAL LOW (ref 8–23)
CO2: 15 mmol/L — ABNORMAL LOW (ref 22–32)
Calcium: 7.8 mg/dL — ABNORMAL LOW (ref 8.9–10.3)
Chloride: 106 mmol/L (ref 98–111)
Creatinine, Ser: 1.06 mg/dL — ABNORMAL HIGH (ref 0.44–1.00)
GFR, Estimated: 59 mL/min — ABNORMAL LOW (ref >60)
Glucose, Bld: 391 mg/dL — ABNORMAL HIGH (ref 70–99)
Potassium: 4.7 mmol/L (ref 3.5–5.1)
Sodium: 137 mmol/L (ref 135–145)

## 2024-02-02 LAB — PROTIME-INR
INR: 4.3 (ref 0.8–1.2)
Prothrombin Time: 42.9 s — ABNORMAL HIGH (ref 11.4–15.2)

## 2024-02-02 LAB — GLUCOSE, CAPILLARY
Glucose-Capillary: 150 mg/dL — ABNORMAL HIGH (ref 70–99)
Glucose-Capillary: 120 mg/dL — ABNORMAL HIGH (ref 70–99)
Glucose-Capillary: 115 mg/dL — ABNORMAL HIGH (ref 70–99)
Glucose-Capillary: 114 mg/dL — ABNORMAL HIGH (ref 70–99)
Glucose-Capillary: 110 mg/dL — ABNORMAL HIGH (ref 70–99)
Glucose-Capillary: 121 mg/dL — ABNORMAL HIGH (ref 70–99)

## 2024-02-02 LAB — MAGNESIUM: Magnesium: 1.9 mg/dL (ref 1.7–2.4)

## 2024-02-02 LAB — LACTIC ACID, PLASMA: Lactic Acid, Venous: 4 mmol/L (ref 0.5–1.9)

## 2024-02-02 LAB — CBC
HCT: 26.5 % — ABNORMAL LOW (ref 36.0–46.0)
Hemoglobin: 9.2 g/dL — ABNORMAL LOW (ref 12.0–15.0)
MCH: 36.2 pg — ABNORMAL HIGH (ref 26.0–34.0)
MCHC: 34.7 g/dL (ref 30.0–36.0)
MCV: 104.3 fL — ABNORMAL HIGH (ref 80.0–100.0)
Platelets: 17 K/uL — CL (ref 150–400)
RBC: 2.54 MIL/uL — ABNORMAL LOW (ref 3.87–5.11)
RDW: 26.6 % — ABNORMAL HIGH (ref 11.5–15.5)
WBC: 17.9 K/uL — ABNORMAL HIGH (ref 4.0–10.5)
nRBC: 0.9 % — ABNORMAL HIGH (ref 0.0–0.2)

## 2024-02-02 LAB — PHOSPHORUS: Phosphorus: 2 mg/dL — ABNORMAL LOW (ref 2.5–4.6)

## 2024-02-02 LAB — TRIGLYCERIDES: Triglycerides: 201 mg/dL — ABNORMAL HIGH (ref <150)

## 2024-02-02 MED ORDER — SODIUM CHLORIDE 0.9% IV SOLUTION: Freq: Once | INTRAVENOUS | Status: AC

## 2024-02-02 MED ORDER — PIPERACILLIN-TAZOBACTAM 3.375 G IVPB
3.3750 g | Freq: Three times a day (TID) | INTRAVENOUS | Status: DC
  Administered 2024-02-02 – 2024-02-03 (×3): 3.375 g via INTRAVENOUS
  Filled 2024-02-02 (×3): qty 50

## 2024-02-02 MED ORDER — K PHOS MONO-SOD PHOS DI & MONO 155-852-130 MG PO TABS
500.0000 mg | ORAL_TABLET | Freq: Once | ORAL | Status: AC
  Administered 2024-02-02: 10:00:00 500 mg
  Filled 2024-02-02: qty 2

## 2024-02-02 MED ORDER — NOREPINEPHRINE 16 MG/250ML-% IV SOLN
0.0000 ug/min | INTRAVENOUS | Status: DC
  Administered 2024-02-02: 06:00:00 14 ug/min via INTRAVENOUS
  Administered 2024-02-02: 23:00:00 18 ug/min via INTRAVENOUS
  Administered 2024-02-03: 32 ug/min via INTRAVENOUS
  Filled 2024-02-02 (×3): qty 250

## 2024-02-02 MED ORDER — ALBUMIN HUMAN 25 % IV SOLN
25.0000 g | Freq: Once | INTRAVENOUS | Status: AC
  Administered 2024-02-02: 07:00:00 25 g via INTRAVENOUS
  Filled 2024-02-02: qty 100

## 2024-02-02 NOTE — Progress Notes (Signed)
 PHARMACY CONSULT NOTE - FOLLOW UP  Pharmacy Consult for Electrolyte Monitoring and Replacement   Recent Labs: Potassium (mmol/L)  Date Value  02/02/2024 4.7   Magnesium  (mg/dL)  Date Value  87/81/7974 1.9   Calcium (mg/dL)  Date Value  87/81/7974 7.8 (L)   Albumin  (g/dL)  Date Value  87/82/7974 2.9 (L)   Phosphorus (mg/dL)  Date Value  87/81/7974 2.0 (L)   Sodium (mmol/L)  Date Value  02/02/2024 137     Assessment: 63 y/o female with h/o etoh abuse, ovarian mass, anxiety, bipolar disorder and hiatal hernia who is admitted with steatohepatitis and new cirrhosis complicated by AMS, septic shock and aspiration PNA. Pharmacy is asked to follow and replace electrolytes while in CCU  Nutrition: Vital AF at 45 mL/hr + FWF at 30 mL every 4h  Goal of Therapy:  Electrolytes WNL  Plan:  ---500 mg kphos neutral per tube x 1 ---recheck electrolytes in am  Claire Brown ,PharmD Clinical Pharmacist 02/02/2024 7:21 AM

## 2024-02-02 NOTE — Progress Notes (Signed)
 Palliative Care Progress Note, Assessment & Plan   Patient Name: Claire Brown       Date: 02/02/2024 DOB: 10/05/1960  Age: 63 y.o. MRN#: [id number] Attending Physician: Isaiah Scrivener, MD Primary Care Physician: Pcp, No Admit Date: 01/22/2024  Subjective: Patient is lying in bed, intubated and sedated.  She is jaundiced.  She is unable to engage in medical decision making or goals of care discussions independently at this time.  No family or friends are present at bedside during my visit.  HPI: 63 y.o. female  with past medical history of bipolar disorder, cirrhosis and ETOH and tobacco use admitted from home on 01/22/2024 with complaints of N/V/D and associated poor PO intake x 1 week.   In ED found to have significantly elevated liver enzymes--now normalized, elevated bilirubin and INR Found to have severe steatohepatitis, GI consulted and following close Anemic-requiring blood transfusions Encephalopathic--CIWA protocol   Early in the morning on 12/16, patient experienced acute respiratory failure possibly related to aspiration pneumonia.  Stat CT at that time was ordered and was negative for acute intracranial abnormalities.  Patient continued to be obtunded, tachycardic, and tachypneic.  She was transferred to stepdown for closer monitoring.  However, she became more hypotensive-developing septic shock-was subsequently intubated/placed on vent.   Palliative medicine was consulted for assisting with goals of care conversations.  Summary of counseling/coordination of care: Extensive chart review completed prior to meeting patient including labs, vital signs, imaging, progress notes, orders, and available advanced directive documents from current and previous encounters.   After reviewing the  patient's chart and assessing the patient at bedside, I counseled with RN.  She endorses that patient desats with turning for personal care.  Patient was placed on Precedex overnight for fighting the vent.  Patient's lactic acid remains high at 4.0, bilirubin elevated at 13.5.   Attempted to speak with patient's Claire Brown over the phone.  No answer.  HIPAA compliant voicemail left with PMT contact info given.  I then spoke with patient significant other Claire Brown over the phone.  Medical update given.  Discussed abnormal labs as well as patient's current functional and cognitive status.  Reviewed MRI was within normal limits, not revealing reasons for patient's mental status to be so poor. Conveyed that patient is showing signs of deterioration despite maximizing medical effort. I clarified with Claire Brown that patient's son Claire Brown is current runner, broadcasting/film/video.  However, Claire Brown can continue to visit with patient and receive updates in regards to her medical status. Claire Brown was audibly tearful but appreciative of the update.  Therapeutic silence, active listening, and emotional support provided.    Updated CCM and RN that I am awaiting return phone call from patient's son.  Physical Exam Vitals reviewed.  Constitutional:      General: She is not in acute distress.    Appearance: She is ill-appearing.  HENT:     Head: Normocephalic.     Mouth/Throat:     Mouth: Mucous membranes are moist.  Cardiovascular:     Rate and Rhythm: Tachycardia present.  Pulmonary:     Comments: Mechanical ventilatory support Abdominal:     Palpations: Abdomen is soft.  Skin:    Coloration: Skin  is jaundiced.              Recommendations:   Awaiting return phone call from patient's son Current plan of care DNR limited interventions remains  35 minute visit includes: Detailed review of medical records (labs, imaging, vital signs), medically appropriate exam (mental status, respiratory, cardiac, skin), discussed with  treatment team, counseling and educating patient, family and staff, documenting clinical information, medication management and coordination of care.  Lamarr L. Arvid, DNP, FNP-BC Palliative Medicine Team

## 2024-02-02 NOTE — Progress Notes (Signed)
 This patient has end-stage liver disease with a very poor prognosis.  The patient has a history of cirrhosis with ongoing alcohol abuse prior to admission.  There is nothing further to do from a GI point of view at this time that we will improve this patient's outcome.  I have discussed this with the ICU team.  I will sign off.  Please call if any further GI concerns or questions.  We would like to thank you for the opportunity to participate in the care of Claire Brown.

## 2024-02-02 NOTE — Progress Notes (Signed)
 NAME:  Claire Brown, MRN:  [id number], DOB:  [birth year], LOS: 11 ADMISSION DATE:  01/22/2024, CONSULTATION DATE:  01/31/2024 REFERRING MD:  Dr. Lawence, CHIEF COMPLAINT:  Altered Mental Status   Brief Pt Description / Synopsis:  63 yo female with PMHx significant for alcohol abuse and bipolar mood disorder who is admitted with severe Steatohepatitis due to ETOH, AKI with multiple electrolyte derangements, and hypoglycemia.  Course complicated by worsening Hepatic Encephalopathy and Acute Hypoxic Respiratory Failure due to suspected aspiration requiring intubation and mechanical ventilation.   History of Present Illness:  Patient is a 63 yo F w/ pertinent PMH Bipolar, anxiety, Cirrhosis, ETOH and tobacco abuse presents to [facility] on 12/7 w/ electrolytes derangement, AKI, steatohepatitis, hypoglycemia.   Patient w/ 1 week of vomiting. Came to [facility] on 12/7. LFTs markedly elevated and t bili 2.7. CT abd/pelvis possible infectious coitis and severe hepatic steatosis; possible gastritis. GI consulted and following. Started on PPI. Home lactulose  and rifaximin  resumed. Patient also w/ AKI, hypoglycemia, and electrolyte derangements. Electrolytes repleted and given iv fluids. Given hx of alcohol use CIWA protocol in place. On 12/12 started on rocephin /flagyl  and paracentesis performed w/ 1.6 L of fluid removed which is negative for SBP.    On 12/15 Rapid response called overnight. Tmax 100.6 F and wbc trending up to 12.1. Cultures obtained and abx broadened. Overnight developed respiratory distress and became altered. CT head no acute abnormality. Concerned for aspiration pna vs. Pulmonary edema. Given lasix . Developed worsening hypotension started on levo and brought to icu. Worsening sob and ams patient intubated. PCCM consulted.  Please see Significant Hospital Events section below for full detailed hospital course.   Pertinent  Medical History   Past Medical History:  Diagnosis Date   Anemia     Anxiety    Arthritis    Bipolar disorder (HCC)    Headache     Micro Data:  12/7: COVID/FLU/RSV PCR>> negative 12/7: Acute Hepatitis panel>> nonreactive 12/12: Peritoneal fluid>> no growth  12/15: Blood cultures x2>> no growth to date 12/16: MRSA PCR>> negative 12/18: MRSA PCR>>  Antimicrobials:   Anti-infectives (From admission, onward)    Start     Dose/Rate Route Frequency Ordered Stop   01/30/24 2230  Ampicillin -Sulbactam (UNASYN ) 3 g in sodium chloride  0.9 % 100 mL IVPB        3 g 200 mL/hr over 30 Minutes Intravenous Every 6 hours 01/30/24 2141     01/30/24 2230  doxycycline  (VIBRAMYCIN ) 100 mg in sodium chloride  0.9 % 250 mL IVPB        100 mg 125 mL/hr over 120 Minutes Intravenous Every 12 hours 01/30/24 2141     01/30/24 1330  rifaximin  (XIFAXAN ) tablet 550 mg        550 mg Per Tube 2 times daily 01/30/24 1230     01/27/24 1800  cefTRIAXone  (ROCEPHIN ) 2 g in sodium chloride  0.9 % 100 mL IVPB  Status:  Discontinued        2 g 200 mL/hr over 30 Minutes Intravenous Every 24 hours 01/27/24 1645 01/30/24 2141   01/27/24 1800  metroNIDAZOLE  (FLAGYL ) IVPB 500 mg  Status:  Discontinued        500 mg 100 mL/hr over 60 Minutes Intravenous Every 12 hours 01/27/24 1645 01/28/24 1155   01/27/24 1245  cefTRIAXone  (ROCEPHIN ) 2 g in sodium chloride  0.9 % 100 mL IVPB  Status:  Discontinued        2 g 200 mL/hr over 30 Minutes Intravenous  Every 24 hours 01/27/24 1145 01/27/24 1628       Significant Hospital Events: Including procedures, antibiotic start and stop dates in addition to other pertinent events   12/7 admit 12/12: INR 3.8, elevated, vitamin K 10 mg subcu x 1 dose given as per GI.  Monitor INR. pharmacy consulted to start ceftriaxone  and Flagyl   for possible intra-abdominal infection 12/13 d/w GI, less likely acute generalized colitis as there are no symptoms and findings could be secondary to liver cirrhosis.  Discontinued Flagyl  12/15 worsening ams and sob;  intubated 12/16: MRI Brain negative.  EEG obtained  12/17: No acute events overnight, remains intubated on no sedation and unresponsive, mental status precludes extubation.  EEG resulted with burst suppression pattern indicative of severe cerebral dysfunction and metabolic encephalopathy. Met with pts' son, code status changed to DNR/DNI. 12/18: Overnight pt declined, with increased FiO2 (60%) requirements due to hypoxia with repositioning, along with increasing Levophed  requirements.    Leukocytosis worsening, worsening acidosis, Alk phos/T. Bili worsening. Will broaden ABX to Zosyn , check MRSA PCR for possible Vancomycin , no secretions appreciated from ETT to culture. Overall very poor prognosis.  Palliative Care to follow up with son on GOC discussions.   Interim History / Subjective:  As outlined above under Significant Hospital Events section  Objective   Blood pressure 110/81, pulse (!) 102, temperature 97.9 F (36.6 C), temperature source Oral, resp. rate (!) 26, height 5' (1.524 m), weight 64.3 kg, SpO2 99%.    Vent Mode: PRVC FiO2 (%):  [0.4 %-60 %] 60 % Set Rate:  [18 bmp] 18 bmp Vt Set:  [400 mL] 400 mL PEEP:  [5 cmH20] 5 cmH20 Plateau Pressure:  [20 cmH20-33 cmH20] 25 cmH20   Intake/Output Summary (Last 24 hours) at 02/02/2024 9261 Last data filed at 02/02/2024 9347 Gross per 24 hour  Intake 4106.54 ml  Output 230 ml  Net 3876.54 ml   Filed Weights   01/31/24 0459 02/01/24 0500 02/02/24 0359  Weight: 60.1 kg 61 kg 64.3 kg    Examination: General: Critically ill appearing female, laying in bed, intubated, on sedation, in NAD HENT: Atraumatic, normocephalic, neck supple, no JVD, orally intubated, scleral icterus Lungs: Coarse breath sounds throughout, even, overbreathing the vent Cardiovascular: RRR, s1s2, no M/R/G Abdomen: Obese, soft, slightly distended and taught, no guarding or rebound tenderness, BS+ x4 Extremities: warm/dry, trace edema, jaundice Neuro:  Unresponsive, + cough/gag reflex, PERRL  GU: Foley catheter in place draining yellow urine   Resolved Hospital Problem list     Assessment & Plan:   #Acute Hepatic Encephalopathy  #Alcohol use disorder  -CT head negative; MRI Brain negative -EEG with  burst suppression pattern indicative of severe cerebral dysfunction and metabolic encephalopathy. -Maintain a RASS goal of 0 to -1 -Propofol  to maintain RASS goal -Avoid sedating medications as able -Daily wake up assessment -Continue high dose thiamine , Folic acid , MVI -Continue Lactulose  and Rifaximin  -Trend Ammonia   #Acute Respiratory Failure #Concern for possible Aspiration Pneumonia -Full vent support, implement lung protective strategies -Plateau pressures less than 30 cm H20 -Wean FiO2 & PEEP as tolerated to maintain O2 sats >92% -Follow intermittent Chest X-ray & ABG as needed -Spontaneous Breathing Trials when respiratory parameters met and mental status permits  -Implement VAP Bundle -Prn Bronchodilators -ABX as above ~ broaden to Zosyn  12/18  #Acute Hepatitis 2/2 ETOH abuse #Acute on Chronic Transaminitis #Liver Cirrhosis with Ascites #Hypoalbuminemia -Status post Paracentesis 12/12: removed 1.6 L fluid, negative for SBP -GI following, appreciate input ~  recommends supportive care -Trend LFT's, INR, ammonia -Continue Lactulose  and Rifaximin   #Shock -Echocardiogram 01/29/24: LVEF 60-65%, Grade I Diastolic dysfunction, RV systolic function normal, RV size normal -Continuous cardiac monitoring -Maintain MAP >60 -IV Albumin   -Vasopressors as needed to maintain MAP goal -HS Troponin negative x2 -Diuresis as BP and renal function permits ~ holding for now due to shock   #Suspected Aspiration Pneumonia #SBP ruled out (Paracentesis 12/12 negative for SBP) -Monitor fever curve -Trend WBC's & Procalcitonin -Follow cultures as above -Broaden to empiric Zosyn  pending cultures &  sensitivities  #AKI #Hypokalemia #Hypophosphatemia -Monitor I&O's / urinary output -Follow BMP -Ensure adequate renal perfusion -Avoid nephrotoxic agents as able -Replace electrolytes as indicated ~ Pharmacy following for assistance with electrolyte replacement  #Anemia  #Thrombocytopenia -Monitor for S/Sx of bleeding -Trend CBC -SCD's for VTE Prophylaxis  -Transfuse for Hgb <7 -Transfuse Platelets for Platelet count < 10K; < 50K with active bleeding; < 100K with Neurosurgical procedures/processes      Pt is critically ill with multiorgan failure. Prognosis is guarded, high risk for further decompensation, cardiac arrest, and death.  Given current critical illness superimposed on multiple chronic co-morbidities and advanced age, overall long term prognosis is poor.  Now DNR/DNI status.  Palliative Care is following to assist with GOC discussions.   Best Practice (right click and Reselect all SmartList Selections daily)   Diet/type: tubefeeds and NPO DVT prophylaxis: SCD GI prophylaxis: PPI Lines: Central line and yes and it is still needed Foley:  Yes, and it is still needed Code Status:  DNR/DNI Last date of multidisciplinary goals of care discussion [12/18]  12/18: Will update pt's son when he arrives at bedside on plan of care.  Labs   CBC: Recent Labs  Lab 01/28/24 1845 01/29/24 0528 01/30/24 2031 01/31/24 0429 02/01/24 0407 02/02/24 0357  WBC 8.7 9.5 10.1 12.1* 13.6* 17.9*  NEUTROABS 6.4  --   --   --   --   --   HGB 10.6* 10.2* 10.3* 9.8* 8.8* 9.2*  HCT 30.1* 28.5* 28.2* 27.3* 25.2* 26.5*  MCV 100.0 99.3 98.3 100.4* 101.6* 104.3*  PLT 17* 14* 32* 26* 21* 17*    Basic Metabolic Panel: Recent Labs  Lab 01/29/24 0528 01/30/24 0500 01/30/24 2031 01/30/24 2249 01/31/24 0429 02/01/24 0407 02/01/24 2047 02/02/24 0357  NA 140  --  143  --  146* 145  --  137  K 3.6  --  3.1*  --  3.8 2.8* 3.1* 4.7  CL 109  --  115*  --  115* 113*  --  106  CO2 20*   --  17*  --  16* 19*  --  15*  GLUCOSE 105*  --  123*  --  168* 149*  --  391*  BUN 6*  --  7*  --  7* 7*  --  7*  CREATININE 0.71  --  0.91  --  1.04* 1.11*  --  1.06*  CALCIUM 7.9*  --  8.6*  --  8.5* 8.6*  --  7.8*  MG 1.7 1.7  --  1.8 2.4 2.2  --  1.9  PHOS  --  2.8  --   --  2.3* 1.8* 2.9 2.0*   GFR: Estimated Creatinine Clearance: 46 mL/min (A) (by C-G formula based on SCr of 1.06 mg/dL (H)). Recent Labs  Lab 01/30/24 2031 01/31/24 0050 01/31/24 0429 01/31/24 0610 02/01/24 0407 02/02/24 0357  PROCALCITON  --   --   --  1.33  --   --  WBC 10.1  --  12.1*  --  13.6* 17.9*  LATICACIDVEN 3.3*   < > 5.0* 4.8* 4.1* 4.0*   < > = values in this interval not displayed.    Liver Function Tests: Recent Labs  Lab 01/28/24 0600 01/29/24 0528 01/30/24 2031 01/31/24 0429 02/01/24 0407  AST 47* 46* 37 38 35  ALT 48* 46* 37 34 26  ALKPHOS 153* 161* 160* 145* 141*  BILITOT 8.3* 10.2* 12.7* 11.8* 11.6*  PROT 3.5* 3.7* 4.1* 3.9* 4.3*  ALBUMIN  2.6* 2.5* 2.7* 2.6* 2.9*   No results for input(s): LIPASE, AMYLASE in the last 168 hours. Recent Labs  Lab 01/28/24 1142 01/30/24 2308 01/31/24 0848 02/01/24 0407  AMMONIA 100* 104* 97* 176*    ABG    Component Value Date/Time   PHART 7.4 01/31/2024 0814   PCO2ART 30 (L) 01/31/2024 0814   PO2ART 84 01/31/2024 0814   HCO3 18.6 (L) 01/31/2024 0814   ACIDBASEDEF 5.0 (H) 01/31/2024 0814   O2SAT 97.3 01/31/2024 0814     Coagulation Profile: Recent Labs  Lab 01/28/24 0500 01/29/24 0559 01/31/24 0848 02/01/24 0407 02/02/24 0357  INR 3.7* 3.2* 3.5* 4.0* 4.3*    Cardiac Enzymes: No results for input(s): CKTOTAL, CKMB, CKMBINDEX, TROPONINI in the last 168 hours.  HbA1C: No results found for: HGBA1C  CBG: Recent Labs  Lab 02/01/24 1110 02/01/24 1545 02/01/24 1951 02/01/24 2335 02/02/24 0336  GLUCAP 151* 130* 144* 117* 150*    Review of Systems:   Unable to assess due to intubation/AMS/critical  illness   Past Medical History:  She,  has a past medical history of Anemia, Anxiety, Arthritis, Bipolar disorder (HCC), and Headache.   Surgical History:   Past Surgical History:  Procedure Laterality Date   CESAREAN SECTION     CYSTOSCOPY N/A 10/14/2015   Procedure: CYSTOSCOPY;  Surgeon: Glory High, MD;  Location: [facility] ORS;  Service: Gynecology;  Laterality: N/A;   FRACTURE SURGERY Left 12/2015   Femoral Neck Pin Placement - Dr. Madelynn ([facility])   KNEE SURGERY Bilateral    LAPAROSCOPIC BILATERAL SALPINGO OOPHERECTOMY  10/14/2015   Procedure: LAPAROSCOPIC BILATERAL SALPINGO OOPHORECTOMY;  Surgeon: Glory High, MD;  Location: [facility] ORS;  Service: Gynecology;;   LAPAROSCOPIC HYSTERECTOMY N/A 10/14/2015   Procedure: HYSTERECTOMY TOTAL LAPAROSCOPIC;  Surgeon: Glory High, MD;  Location: [facility] ORS;  Service: Gynecology;  Laterality: N/A;     Social History:   reports that she has been smoking cigarettes. She has never used smokeless tobacco. She reports current alcohol use. She reports that she does not use drugs.   Family History:  Her family history includes Cancer in her brother.   Allergies Allergies[1]   Home Medications  Prior to Admission medications  Medication Sig Start Date End Date Taking? Authorizing Provider  acetaminophen  (TYLENOL ) 325 MG tablet Take 650 mg by mouth every 12 (twelve) hours as needed for mild pain (pain score 1-3) or moderate pain (pain score 4-6).   Yes [provider]  dimenhyDRINATE (DRAMAMINE) 50 MG tablet Take 50 mg by mouth every 8 (eight) hours as needed.   Yes [provider]  diphenhydrAMINE  (BENADRYL ) 25 MG tablet Take 25 mg by mouth every 6 (six) hours as needed for allergies.   Yes [provider]  Multiple Vitamin (MULTIVITAMIN WITH MINERALS) TABS tablet Take 1 tablet by mouth daily.   Yes [provider]  Potassium Gluconate 550 MG TABS Take 550 mg by mouth every morning.   Yes [provider]  Critical care time: 40 minutes     Inge Lecher, AGACNP-BC [facility] Prefer epic messenger for cross cover needs If after hours, please call E-link        [1]  Allergies Allergen Reactions   Amoxicillin-Pot Clavulanate Nausea Only and Nausea And Vomiting   Oxycodone  Anxiety, Other (See Comments) and Dermatitis    Reaction: jittery/ unable to sleep   Penicillins Itching, Other (See Comments) and Hives    Patient cannot remember the specifics of the reaction. Has taken Amoxicillin since with no problem.   Diazepam Anxiety, Other (See Comments) and Nausea Only    Reaction: unable to sleep

## 2024-02-02 NOTE — Plan of Care (Signed)
°  Problem: Education: Goal: Knowledge of General Education information will improve Description: Including pain rating scale, medication(s)/side effects and non-pharmacologic comfort measures Outcome: Not Progressing   Problem: Health Behavior/Discharge Planning: Goal: Ability to manage health-related needs will improve Outcome: Not Progressing   Problem: Clinical Measurements: Goal: Ability to maintain clinical measurements within normal limits will improve Outcome: Not Progressing Goal: Diagnostic test results will improve Outcome: Not Progressing Goal: Respiratory complications will improve Outcome: Not Progressing   Problem: Activity: Goal: Risk for activity intolerance will decrease Outcome: Not Progressing   Problem: Coping: Goal: Level of anxiety will decrease Outcome: Not Progressing   Problem: Elimination: Goal: Will not experience complications related to bowel motility Outcome: Not Progressing Goal: Will not experience complications related to urinary retention Outcome: Not Progressing

## 2024-02-02 NOTE — Plan of Care (Signed)
°  Problem: Elimination: Goal: Will not experience complications related to bowel motility Outcome: Progressing   Problem: Safety: Goal: Ability to remain free from injury will improve Outcome: Progressing   Problem: Education: Goal: Knowledge of General Education information will improve Description: Including pain rating scale, medication(s)/side effects and non-pharmacologic comfort measures Outcome: Not Progressing   Problem: Clinical Measurements: Goal: Will remain free from infection Outcome: Not Progressing   Problem: Respiratory: Goal: Ability to maintain adequate ventilation will improve Outcome: Not Progressing

## 2024-02-03 DIAGNOSIS — J9602 Acute respiratory failure with hypercapnia: Secondary | ICD-10-CM

## 2024-02-03 DIAGNOSIS — J9601 Acute respiratory failure with hypoxia: Secondary | ICD-10-CM

## 2024-02-03 LAB — CBC
HCT: 24.3 % — ABNORMAL LOW (ref 36.0–46.0)
Hemoglobin: 7.9 g/dL — ABNORMAL LOW (ref 12.0–15.0)
MCH: 35.3 pg — ABNORMAL HIGH (ref 26.0–34.0)
MCHC: 32.5 g/dL (ref 30.0–36.0)
MCV: 108.5 fL — ABNORMAL HIGH (ref 80.0–100.0)
Platelets: 21 K/uL — CL (ref 150–400)
RBC: 2.24 MIL/uL — ABNORMAL LOW (ref 3.87–5.11)
RDW: 27.1 % — ABNORMAL HIGH (ref 11.5–15.5)
WBC: 20.5 K/uL — ABNORMAL HIGH (ref 4.0–10.5)
nRBC: 3.1 % — ABNORMAL HIGH (ref 0.0–0.2)

## 2024-02-03 LAB — BPAM FFP
Blood Product Expiration Date: 202512182359
ISSUE DATE / TIME: 202512180547
Unit Type and Rh: 6200

## 2024-02-03 LAB — PROTIME-INR
INR: 3.9 — ABNORMAL HIGH (ref 0.8–1.2)
Prothrombin Time: 40.1 s — ABNORMAL HIGH (ref 11.4–15.2)

## 2024-02-03 LAB — GLUCOSE, CAPILLARY
Glucose-Capillary: 123 mg/dL — ABNORMAL HIGH (ref 70–99)
Glucose-Capillary: 133 mg/dL — ABNORMAL HIGH (ref 70–99)
Glucose-Capillary: 147 mg/dL — ABNORMAL HIGH (ref 70–99)

## 2024-02-03 LAB — BASIC METABOLIC PANEL WITH GFR
Anion gap: 12 (ref 5–15)
BUN: 9 mg/dL (ref 8–23)
CO2: 19 mmol/L — ABNORMAL LOW (ref 22–32)
Calcium: 8.3 mg/dL — ABNORMAL LOW (ref 8.9–10.3)
Chloride: 116 mmol/L — ABNORMAL HIGH (ref 98–111)
Creatinine, Ser: 1.42 mg/dL — ABNORMAL HIGH (ref 0.44–1.00)
GFR, Estimated: 42 mL/min — ABNORMAL LOW
Glucose, Bld: 120 mg/dL — ABNORMAL HIGH (ref 70–99)
Potassium: 4.7 mmol/L (ref 3.5–5.1)
Sodium: 148 mmol/L — ABNORMAL HIGH (ref 135–145)

## 2024-02-03 LAB — MISC LABCORP TEST (SEND OUT): Labcorp test code: 791584

## 2024-02-03 LAB — PREPARE FRESH FROZEN PLASMA

## 2024-02-03 LAB — PHOSPHORUS: Phosphorus: 2.9 mg/dL (ref 2.5–4.6)

## 2024-02-03 LAB — MAGNESIUM: Magnesium: 1.9 mg/dL (ref 1.7–2.4)

## 2024-02-03 MED ORDER — MAGNESIUM SULFATE IN D5W 1-5 GM/100ML-% IV SOLN
1.0000 g | Freq: Once | INTRAVENOUS | Status: AC
  Administered 2024-02-03: 1 g via INTRAVENOUS
  Filled 2024-02-03: qty 100

## 2024-02-03 MED ORDER — GLYCOPYRROLATE 0.2 MG/ML IJ SOLN
0.2000 mg | INTRAMUSCULAR | Status: DC | PRN
  Administered 2024-02-03: 0.2 mg via INTRAVENOUS
  Filled 2024-02-03: qty 1

## 2024-02-03 MED ORDER — GLYCOPYRROLATE 0.2 MG/ML IJ SOLN: 0.2000 mg | INTRAMUSCULAR | Status: DC | PRN

## 2024-02-03 MED ORDER — MIDAZOLAM HCL (PF) 2 MG/2ML IJ SOLN
2.0000 mg | INTRAMUSCULAR | Status: DC | PRN
  Administered 2024-02-03 (×2): 2 mg via INTRAVENOUS
  Filled 2024-02-03 (×2): qty 2

## 2024-02-03 MED ORDER — HYDROMORPHONE HCL-NACL 50-0.9 MG/50ML-% IV SOLN
1.0000 mg/h | INTRAVENOUS | Status: DC
  Administered 2024-02-03: 1 mg/h via INTRAVENOUS
  Filled 2024-02-03: qty 50

## 2024-02-03 MED ORDER — K PHOS MONO-SOD PHOS DI & MONO 155-852-130 MG PO TABS
500.0000 mg | ORAL_TABLET | Freq: Once | ORAL | Status: AC
  Administered 2024-02-03: 500 mg
  Filled 2024-02-03: qty 2

## 2024-02-03 MED ORDER — HYDROMORPHONE BOLUS VIA INFUSION
1.0000 mg | INTRAVENOUS | Status: DC | PRN
  Administered 2024-02-03 (×5): 1 mg via INTRAVENOUS

## 2024-02-03 MED ORDER — SODIUM CHLORIDE 0.9 % IV SOLN: INTRAVENOUS | Status: DC

## 2024-02-03 MED ORDER — ACETAMINOPHEN 650 MG RE SUPP: 650.0000 mg | Freq: Four times a day (QID) | RECTAL | Status: DC | PRN

## 2024-02-03 MED ORDER — POLYVINYL ALCOHOL 1.4 % OP SOLN: 1.0000 [drp] | Freq: Four times a day (QID) | OPHTHALMIC | Status: DC | PRN

## 2024-02-03 MED ORDER — GLYCOPYRROLATE 1 MG PO TABS: 1.0000 mg | ORAL_TABLET | ORAL | Status: DC | PRN

## 2024-02-03 MED ORDER — ACETAMINOPHEN 325 MG PO TABS: 650.0000 mg | ORAL_TABLET | Freq: Four times a day (QID) | ORAL | Status: DC | PRN

## 2024-02-05 LAB — CULTURE, BLOOD (ROUTINE X 2)
Culture: NO GROWTH
Culture: NO GROWTH
Special Requests: ADEQUATE

## 2024-02-16 NOTE — Plan of Care (Signed)
  Problem: Education: Goal: Knowledge of General Education information will improve Description: Including pain rating scale, medication(s)/side effects and non-pharmacologic comfort measures Outcome: Not Progressing   Problem: Health Behavior/Discharge Planning: Goal: Ability to manage health-related needs will improve Outcome: Not Progressing   Problem: Clinical Measurements: Goal: Ability to maintain clinical measurements within normal limits will improve Outcome: Not Progressing Goal: Will remain free from infection Outcome: Not Progressing Goal: Diagnostic test results will improve Outcome: Not Progressing Goal: Respiratory complications will improve Outcome: Not Progressing Goal: Cardiovascular complication will be avoided Outcome: Not Progressing   Problem: Activity: Goal: Risk for activity intolerance will decrease Outcome: Not Progressing   Problem: Nutrition: Goal: Adequate nutrition will be maintained Outcome: Not Progressing   Problem: Coping: Goal: Level of anxiety will decrease Outcome: Not Progressing   Problem: Elimination: Goal: Will not experience complications related to bowel motility Outcome: Not Progressing Goal: Will not experience complications related to urinary retention Outcome: Not Progressing   Problem: Pain Managment: Goal: General experience of comfort will improve and/or be controlled Outcome: Not Progressing   Problem: Safety: Goal: Ability to remain free from injury will improve Outcome: Not Progressing   Problem: Skin Integrity: Goal: Risk for impaired skin integrity will decrease Outcome: Not Progressing   Problem: Activity: Goal: Ability to tolerate increased activity will improve Outcome: Not Progressing   Problem: Clinical Measurements: Goal: Ability to maintain a body temperature in the normal range will improve Outcome: Not Progressing   Problem: Respiratory: Goal: Ability to maintain adequate ventilation will  improve Outcome: Not Progressing Goal: Ability to maintain a clear airway will improve Outcome: Not Progressing   Problem: Activity: Goal: Ability to tolerate increased activity will improve Outcome: Not Progressing   Problem: Respiratory: Goal: Ability to maintain a clear airway and adequate ventilation will improve Outcome: Not Progressing   Problem: Role Relationship: Goal: Method of communication will improve Outcome: Not Progressing

## 2024-02-16 NOTE — Progress Notes (Signed)
" °   02/05/2024 1530  Spiritual Encounters  Type of Visit Initial  Care provided to: Pt not available (Pt passed; No family was present)  Psychologist, [employer] partners present during Programmer, Systems  Referral source Nurse (RN/NT/LPN)  Reason for visit End-of-life  OnCall Visit No  Interventions  Spiritual Care Interventions Made Prayer;Compassionate presence    "

## 2024-02-16 NOTE — Progress Notes (Signed)
 PT EXTUBATED AT 1502 WITH NO COMPLICATIONS.

## 2024-02-16 NOTE — Progress Notes (Signed)
 Time of death called at 1535 by Marty Box RN and Macario Pounds RN. MD Isaiah and NP Arloa notified. Son and significant other notified. Body prepped and transferred to morgue by transport.

## 2024-02-16 NOTE — Progress Notes (Signed)
 PHARMACY CONSULT NOTE  Pharmacy Consult for Electrolyte Monitoring and Replacement   Recent Labs: Potassium (mmol/L)  Date Value  02/27/24 4.7   Magnesium  (mg/dL)  Date Value  87/80/7974 1.9   Calcium (mg/dL)  Date Value  87/80/7974 8.3 (L)   Albumin  (g/dL)  Date Value  87/81/7974 2.6 (L)   Phosphorus (mg/dL)  Date Value  87/80/7974 2.9   Sodium (mmol/L)  Date Value  2024-02-27 148 (H)   Assessment: 64 y/o female with h/o etoh abuse, ovarian mass, anxiety, bipolar disorder and hiatal hernia who is admitted with steatohepatitis and new cirrhosis complicated by AMS, septic shock and aspiration PNA. Pharmacy is asked to follow and replace electrolytes while in CCU  Nutrition: Vital AF at 45 mL/hr + FWF at 30 mL every 4h  Goal of Therapy:  Electrolytes WNL  Plan:  --Mg 1.9, magnesium  sulfate 1 g IV x 1 --Phos 2.9, repeat K Phos  Neutral 500 mg per tube x 1 dose --Follow-up electrolytes with AM labs tomorrow  Marolyn KATHEE Mare 02/27/2024 8:10 AM

## 2024-02-16 NOTE — Progress Notes (Signed)
 "  NAME:  Claire Brown, MRN:  [id number], DOB:  06/26/1960, LOS: 12 ADMISSION DATE:  01/22/2024, CONSULTATION DATE:  01/31/2024 REFERRING MD:  Dr. Lawence, CHIEF COMPLAINT:  Altered Mental Status   Brief Pt Description / Synopsis:  64 yo female with PMHx significant for alcohol  abuse and bipolar mood disorder who is admitted with severe Steatohepatitis due to ETOH, AKI with multiple electrolyte derangements, and hypoglycemia.  Course complicated by worsening Hepatic Encephalopathy and Acute Hypoxic Respiratory Failure due to suspected aspiration requiring intubation and mechanical ventilation.   History of Present Illness:  Patient is a 64 yo F w/ pertinent PMH Bipolar, anxiety, Cirrhosis, ETOH and tobacco abuse presents to [facility] on 12/7 w/ electrolytes derangement, AKI, steatohepatitis, hypoglycemia.   Patient w/ 1 week of vomiting. Came to [facility] on 12/7. LFTs markedly elevated and t bili 2.7. CT abd/pelvis possible infectious coitis and severe hepatic steatosis; possible gastritis. GI consulted and following. Started on PPI. Home lactulose  and rifaximin  resumed. Patient also w/ AKI, hypoglycemia, and electrolyte derangements. Electrolytes repleted and given iv fluids. Given hx of alcohol  use CIWA protocol in place. On 12/12 started on rocephin /flagyl  and paracentesis performed w/ 1.6 L of fluid removed which is negative for SBP.    On 12/15 Rapid response called overnight. Tmax 100.6 F and wbc trending up to 12.1. Cultures obtained and abx broadened. Overnight developed respiratory distress and became altered. CT head no acute abnormality. Concerned for aspiration pna vs. Pulmonary edema. Given lasix . Developed worsening hypotension started on levo and brought to icu. Worsening sob and ams patient intubated. PCCM consulted.  Please see Significant Hospital Events section below for full detailed hospital course.   Pertinent  Medical History   Past Medical History:  Diagnosis Date   Anemia     Anxiety    Arthritis    Bipolar disorder (HCC)    Headache     Micro Data:  12/7: COVID/FLU/RSV PCR>> negative 12/7: Acute Hepatitis panel>> nonreactive 12/12: Peritoneal fluid>> no growth  12/15: Blood cultures x2>> no growth to date 12/16: MRSA PCR>> negative 12/18: MRSA PCR>>negative  Antimicrobials:   Anti-infectives (From admission, onward)    Start     Dose/Rate Route Frequency Ordered Stop   02/02/24 1500  piperacillin -tazobactam (ZOSYN ) IVPB 3.375 g        3.375 g 12.5 mL/hr over 240 Minutes Intravenous Every 8 hours 02/02/24 1409     01/30/24 2230  Ampicillin -Sulbactam (UNASYN ) 3 g in sodium chloride  0.9 % 100 mL IVPB  Status:  Discontinued        3 g 200 mL/hr over 30 Minutes Intravenous Every 6 hours 01/30/24 2141 02/02/24 1348   01/30/24 2230  doxycycline  (VIBRAMYCIN ) 100 mg in sodium chloride  0.9 % 250 mL IVPB        100 mg 125 mL/hr over 120 Minutes Intravenous Every 12 hours 01/30/24 2141     01/30/24 1330  rifaximin  (XIFAXAN ) tablet 550 mg        550 mg Per Tube 2 times daily 01/30/24 1230     01/27/24 1800  cefTRIAXone  (ROCEPHIN ) 2 g in sodium chloride  0.9 % 100 mL IVPB  Status:  Discontinued        2 g 200 mL/hr over 30 Minutes Intravenous Every 24 hours 01/27/24 1645 01/30/24 2141   01/27/24 1800  metroNIDAZOLE  (FLAGYL ) IVPB 500 mg  Status:  Discontinued        500 mg 100 mL/hr over 60 Minutes Intravenous Every 12 hours 01/27/24 1645 01/28/24  1155   01/27/24 1245  cefTRIAXone  (ROCEPHIN ) 2 g in sodium chloride  0.9 % 100 mL IVPB  Status:  Discontinued        2 g 200 mL/hr over 30 Minutes Intravenous Every 24 hours 01/27/24 1145 01/27/24 1628       Significant Hospital Events: Including procedures, antibiotic start and stop dates in addition to other pertinent events   12/7 admit 12/12: INR 3.8, elevated, vitamin K 10 mg subcu x 1 dose given as per GI.  Monitor INR. pharmacy consulted to start ceftriaxone  and Flagyl   for possible intra-abdominal  infection 12/13 d/w GI, less likely acute generalized colitis as there are no symptoms and findings could be secondary to liver cirrhosis.  Discontinued Flagyl  12/15 worsening ams and sob; intubated 12/16: MRI Brain negative.  EEG obtained  12/17: No acute events overnight, remains intubated on no sedation and unresponsive, mental status precludes extubation.  EEG resulted with burst suppression pattern indicative of severe cerebral dysfunction and metabolic encephalopathy. Met with pts' son, code status changed to DNR/DNI. 12/18: Overnight pt declined, with increased FiO2 (60%) requirements due to hypoxia with repositioning, along with increasing Levophed  requirements.    Leukocytosis worsening, worsening acidosis, Alk phos/T. Bili worsening. Will broaden ABX to Zosyn , check MRSA PCR for possible Vancomycin , no secretions appreciated from ETT to culture. Overall very poor prognosis.  Palliative Care to follow up with son on GOC discussions.  03-01-24: Overnight again not tolerating turning due to desaturations, Levophed  requirements increased, Leukocytosis worsening. Renal function worsening with oliguria. Tentative plan to transition to comfort measures per son's requests once family & friends have been able to visit.   Interim History / Subjective:  As outlined above under Significant Hospital Events section  Objective   Blood pressure (!) 87/65, pulse (!) 112, temperature 98 F (36.7 C), temperature source Oral, resp. rate (!) 31, height 5' (1.524 m), weight 62 kg, SpO2 97%.    Vent Mode: PRVC FiO2 (%):  [50 %-100 %] 60 % Set Rate:  [18 bmp] 18 bmp Vt Set:  [400 mL] 400 mL PEEP:  [5 cmH20] 5 cmH20   Intake/Output Summary (Last 24 hours) at 03-01-2024 0735 Last data filed at Mar 01, 2024 9271 Gross per 24 hour  Intake 2787.91 ml  Output 135 ml  Net 2652.91 ml   Filed Weights   02/01/24 0500 02/02/24 0359 03-01-24 0500  Weight: 61 kg 64.3 kg 62 kg    Examination: General: Critically  ill appearing female, laying in bed, intubated, on sedation, in NAD HENT: Atraumatic, normocephalic, neck supple, no JVD, orally intubated, scleral icterus Lungs: Coarse breath sounds throughout, even, overbreathing the vent Cardiovascular: RRR, s1s2, no M/R/G Abdomen: Obese, soft, slightly distended and taught, no guarding or rebound tenderness, BS+ x4 Extremities: warm/dry, trace edema, jaundice Neuro: Unresponsive, + cough/gag reflex, PERRL  GU: Foley catheter in place draining yellow urine   Resolved Hospital Problem list     Assessment & Plan:   #Acute Hepatic Encephalopathy  #Alcohol  use disorder  -CT head negative; MRI Brain negative -EEG with  burst suppression pattern indicative of severe cerebral dysfunction and metabolic encephalopathy. -Maintain a RASS goal of 0 to -1 -Propofol  to maintain RASS goal -Avoid sedating medications as able -Daily wake up assessment -Continue high dose thiamine , Folic acid , MVI -Continue Lactulose  and Rifaximin  -Trend Ammonia   #Acute Respiratory Failure #Concern for possible Aspiration Pneumonia -Full vent support, implement lung protective strategies -Plateau pressures less than 30 cm H20 -Wean FiO2 & PEEP as tolerated  to maintain O2 sats >92% -Follow intermittent Chest X-ray & ABG as needed -Spontaneous Breathing Trials when respiratory parameters met and mental status permits  -Implement VAP Bundle -Prn Bronchodilators -ABX as above ~ broaden to Zosyn  12/18  #Acute Hepatitis 2/2 ETOH abuse #Acute on Chronic Transaminitis #Liver Cirrhosis with Ascites #Hypoalbuminemia -Status post Paracentesis 12/12: removed 1.6 L fluid, negative for SBP -GI following, appreciate input ~ recommends supportive care -Trend LFT's, INR, ammonia -Continue Lactulose  and Rifaximin   #Shock -Echocardiogram 01/29/24: LVEF 60-65%, Grade I Diastolic dysfunction, RV systolic function normal, RV size normal -Continuous cardiac monitoring -Maintain MAP  >60 -IV Albumin   -Vasopressors as needed to maintain MAP goal -HS Troponin negative x2 -Diuresis as BP and renal function permits ~ holding for now due to shock   #Suspected Aspiration Pneumonia #SBP ruled out (Paracentesis 12/12 negative for SBP) -Monitor fever curve -Trend WBC's & Procalcitonin -Follow cultures as above -Continue empiric Zosyn  pending cultures & sensitivities  #AKI #Hypokalemia #Hypophosphatemia -Monitor I&O's / urinary output -Follow BMP -Ensure adequate renal perfusion -Avoid nephrotoxic agents as able -Replace electrolytes as indicated ~ Pharmacy following for assistance with electrolyte replacement  #Anemia  #Thrombocytopenia -Monitor for S/Sx of bleeding -Trend CBC -SCD's for VTE Prophylaxis  -Transfuse for Hgb <7 -Transfuse Platelets for Platelet count < 10K; < 50K with active bleeding; < 100K with Neurosurgical procedures/processes      Pt is critically ill with multiorgan failure. Prognosis is guarded, high risk for further decompensation, cardiac arrest, and death.  Given current critical illness superimposed on multiple chronic co-morbidities and advanced age, overall long term prognosis is poor.  Now DNR/DNI status.  Palliative Care is following to assist with GOC discussions.   Best Practice (right click and Reselect all SmartList Selections daily)   Diet/type: tubefeeds and NPO DVT prophylaxis: SCD GI prophylaxis: PPI Lines: Central line and yes and it is still needed Foley:  Yes, and it is still needed Code Status:  DNR/DNI Last date of multidisciplinary goals of care discussion [12/19]  03-02-2024: Pt's son updated by [place] NP with Palliative, will transition to comfort care.  Labs   CBC: Recent Labs  Lab 01/28/24 1845 01/29/24 0528 01/30/24 2031 01/31/24 0429 02/01/24 0407 02/02/24 0357 03/02/2024 0515  WBC 8.7   < > 10.1 12.1* 13.6* 17.9* 20.5*  NEUTROABS 6.4  --   --   --   --   --   --   HGB 10.6*   < > 10.3* 9.8* 8.8* 9.2*  7.9*  HCT 30.1*   < > 28.2* 27.3* 25.2* 26.5* 24.3*  MCV 100.0   < > 98.3 100.4* 101.6* 104.3* 108.5*  PLT 17*   < > 32* 26* 21* 17* 21*   < > = values in this interval not displayed.    Basic Metabolic Panel: Recent Labs  Lab 01/30/24 2031 01/30/24 2249 01/31/24 0429 02/01/24 0407 02/01/24 2047 02/02/24 0357 2024-03-02 0515  NA 143  --  146* 145  --  137 148*  K 3.1*  --  3.8 2.8* 3.1* 4.7 4.7  CL 115*  --  115* 113*  --  106 116*  CO2 17*  --  16* 19*  --  15* 19*  GLUCOSE 123*  --  168* 149*  --  391* 120*  BUN 7*  --  7* 7*  --  7* 9  CREATININE 0.91  --  1.04* 1.11*  --  1.06* 1.42*  CALCIUM 8.6*  --  8.5* 8.6*  --  7.8*  8.3*  MG  --  1.8 2.4 2.2  --  1.9 1.9  PHOS  --   --  2.3* 1.8* 2.9 2.0* 2.9   GFR: Estimated Creatinine Clearance: 33.8 mL/min (A) (by C-G formula based on SCr of 1.42 mg/dL (H)). Recent Labs  Lab 01/31/24 0429 01/31/24 0610 02/01/24 0407 02/02/24 0357 2024/02/13 0515  PROCALCITON  --  1.33  --   --   --   WBC 12.1*  --  13.6* 17.9* 20.5*  LATICACIDVEN 5.0* 4.8* 4.1* 4.0*  --     Liver Function Tests: Recent Labs  Lab 01/29/24 0528 01/30/24 2031 01/31/24 0429 02/01/24 0407 02/02/24 0357  AST 46* 37 38 35 43*  ALT 46* 37 34 26 26  ALKPHOS 161* 160* 145* 141* 172*  BILITOT 10.2* 12.7* 11.8* 11.6* 13.5*  PROT 3.7* 4.1* 3.9* 4.3* 4.3*  ALBUMIN  2.5* 2.7* 2.6* 2.9* 2.6*   No results for input(s): LIPASE, AMYLASE in the last 168 hours. Recent Labs  Lab 01/28/24 1142 01/30/24 2308 01/31/24 0848 02/01/24 0407  AMMONIA 100* 104* 97* 176*    ABG    Component Value Date/Time   PHART 7.4 01/31/2024 0814   PCO2ART 30 (L) 01/31/2024 0814   PO2ART 84 01/31/2024 0814   HCO3 18.6 (L) 01/31/2024 0814   ACIDBASEDEF 5.0 (H) 01/31/2024 0814   O2SAT 97.3 01/31/2024 0814     Coagulation Profile: Recent Labs  Lab 01/29/24 0559 01/31/24 0848 02/01/24 0407 02/02/24 0357 February 13, 2024 0515  INR 3.2* 3.5* 4.0* 4.3* 3.9*    Cardiac  Enzymes: No results for input(s): CKTOTAL, CKMB, CKMBINDEX, TROPONINI in the last 168 hours.  HbA1C: No results found for: HGBA1C  CBG: Recent Labs  Lab 02/02/24 1156 02/02/24 1614 02/02/24 2036 02/02/24 2317 02/13/2024 0319  GLUCAP 115* 114* 110* 121* 123*    Review of Systems:   Unable to assess due to intubation/AMS/critical illness   Past Medical History:  She,  has a past medical history of Anemia, Anxiety, Arthritis, Bipolar disorder (HCC), and Headache.   Surgical History:   Past Surgical History:  Procedure Laterality Date   CESAREAN SECTION     CYSTOSCOPY N/A 10/14/2015   Procedure: CYSTOSCOPY;  Surgeon: Glory High, MD;  Location: [facility] ORS;  Service: Gynecology;  Laterality: N/A;   FRACTURE SURGERY Left 12/2015   Femoral Neck Pin Placement - Dr. Madelynn ([facility])   KNEE SURGERY Bilateral    LAPAROSCOPIC BILATERAL SALPINGO OOPHERECTOMY  10/14/2015   Procedure: LAPAROSCOPIC BILATERAL SALPINGO OOPHORECTOMY;  Surgeon: Glory High, MD;  Location: [facility] ORS;  Service: Gynecology;;   LAPAROSCOPIC HYSTERECTOMY N/A 10/14/2015   Procedure: HYSTERECTOMY TOTAL LAPAROSCOPIC;  Surgeon: Glory High, MD;  Location: [facility] ORS;  Service: Gynecology;  Laterality: N/A;     Social History:   reports that she has been smoking cigarettes. She has never used smokeless tobacco. She reports current alcohol  use. She reports that she does not use drugs.   Family History:  Her family history includes Cancer in her brother.   Allergies Allergies[1]   Home Medications  Prior to Admission medications  Medication Sig Start Date End Date Taking? Authorizing Provider  acetaminophen  (TYLENOL ) 325 MG tablet Take 650 mg by mouth every 12 (twelve) hours as needed for mild pain (pain score 1-3) or moderate pain (pain score 4-6).   Yes [provider]  dimenhyDRINATE (DRAMAMINE) 50 MG tablet Take 50 mg by mouth every 8 (eight) hours as needed.   Yes [provider]  diphenhydrAMINE  (BENADRYL ) 25  MG tablet Take 25 mg by mouth every 6 (six) hours as needed for allergies.   Yes [provider]  Multiple Vitamin (MULTIVITAMIN WITH MINERALS) TABS tablet Take 1 tablet by mouth daily.   Yes [provider]  Potassium Gluconate 550 MG TABS Take 550 mg by mouth every morning.   Yes [provider]     Critical care time: 40 minutes     Inge Lecher, AGACNP-BC [facility] Prefer epic messenger for cross cover needs If after hours, please call E-link         [1]  Allergies Allergen Reactions   Amoxicillin-Pot Clavulanate Nausea Only and Nausea And Vomiting   Oxycodone  Anxiety, Other (See Comments) and Dermatitis    Reaction: jittery/ unable to sleep   Penicillins Itching, Other (See Comments) and Hives    Patient cannot remember the specifics of the reaction. Has taken Amoxicillin since with no problem.   Diazepam Anxiety, Other (See Comments) and Nausea Only    Reaction: unable to sleep   "

## 2024-02-16 NOTE — Death Summary Note (Signed)
 " DEATH SUMMARY   Patient Details  Name: Claire Brown MRN: [id number] DOB: Jul 15, 1960  Admission/Discharge Information   Admit Date:  February 08, 2024  Date of Death:  02-20-2024   Time of Death:  1535  Length of Stay: Mar 15, 2024  Referring Physician: Pcp, No   Reason(s) for Hospitalization  Admitted for LIVER CIRRHOSIS  Diagnoses  Preliminary cause of death:  Secondary Diagnoses (including complications and co-morbidities):  Principal Problem:   Steatohepatitis, severe Active Problems:   Hypokalemia due to excessive gastrointestinal loss of potassium   Hypoglycemia   AKI (acute kidney injury)   Anemia   Thrombocytopenia   Alcohol  use disorder   Hypotension   Acute gastroenteritis   Hypoalbuminemia   Malnutrition of moderate degree   Acute respiratory failure [facility])   Brief Hospital Course (including significant findings, care, treatment, and services provided and events leading to death)     Brief Pt Description / Synopsis:  64 yo female with PMHx significant for alcohol  abuse and bipolar mood disorder who is admitted with severe Steatohepatitis due to ETOH, AKI with multiple electrolyte derangements, and hypoglycemia.  Course complicated by worsening Hepatic Encephalopathy and Acute Hypoxic Respiratory Failure due to suspected aspiration requiring intubation and mechanical ventilation.    History of Present Illness:  Patient is a 64 yo F w/ pertinent PMH Bipolar, anxiety, Cirrhosis, ETOH and tobacco abuse presents to [facility] on 02-07-25 w/ electrolytes derangement, AKI, steatohepatitis, hypoglycemia.   Patient w/ 1 week of vomiting. Came to [facility] on Feb 07, 2025. LFTs markedly elevated and t bili 2.7. CT abd/pelvis possible infectious coitis and severe hepatic steatosis; possible gastritis. GI consulted and following. Started on PPI. Home lactulose  and rifaximin  resumed. Patient also w/ AKI, hypoglycemia, and electrolyte derangements. Electrolytes repleted and given iv fluids. Given hx of alcohol   use CIWA protocol in place. On 12/12 started on rocephin /flagyl  and paracentesis performed w/ 1.6 L of fluid removed which is negative for SBP.    On 12/15 Rapid response called overnight. Tmax 100.6 F and wbc trending up to 12.1. Cultures obtained and abx broadened. Overnight developed respiratory distress and became altered. CT head no acute abnormality. Concerned for aspiration pna vs. Pulmonary edema. Given lasix . Developed worsening hypotension started on levo and brought to icu. Worsening sob and ams patient intubated. PCCM consulted.   Please see Significant Hospital Events section below for full detailed hospital course.     Pertinent  Medical History        Past Medical History:  Diagnosis Date   Anemia     Anxiety     Arthritis     Bipolar disorder (HCC)     Headache            Micro Data:  02/07/25: COVID/FLU/RSV PCR>> negative 2025-02-07: Acute Hepatitis panel>> nonreactive 12/12: Peritoneal fluid>> no growth  12/15: Blood cultures x2>> no growth to date 12/16: MRSA PCR>> negative 12/18: MRSA PCR>>   Antimicrobials:    Anti-infectives (From admission, onward)        Start     Dose/Rate Route Frequency Ordered Stop    01/30/24 2230   Ampicillin -Sulbactam (UNASYN ) 3 g in sodium chloride  0.9 % 100 mL IVPB        3 g 200 mL/hr over 30 Minutes Intravenous Every 6 hours 01/30/24 2141      01/30/24 2230   doxycycline  (VIBRAMYCIN ) 100 mg in sodium chloride  0.9 % 250 mL IVPB        100 mg 125 mL/hr over 120 Minutes Intravenous  Every 12 hours 01/30/24 2141      01/30/24 1330   rifaximin  (XIFAXAN ) tablet 550 mg        550 mg Per Tube 2 times daily 01/30/24 1230      01/27/24 1800   cefTRIAXone  (ROCEPHIN ) 2 g in sodium chloride  0.9 % 100 mL IVPB  Status:  Discontinued        2 g 200 mL/hr over 30 Minutes Intravenous Every 24 hours 01/27/24 1645 01/30/24 2141    01/27/24 1800   metroNIDAZOLE  (FLAGYL ) IVPB 500 mg  Status:  Discontinued        500 mg 100 mL/hr over 60 Minutes  Intravenous Every 12 hours 01/27/24 1645 01/28/24 1155    01/27/24 1245   cefTRIAXone  (ROCEPHIN ) 2 g in sodium chloride  0.9 % 100 mL IVPB  Status:  Discontinued        2 g 200 mL/hr over 30 Minutes Intravenous Every 24 hours 01/27/24 1145 01/27/24 1628             Significant Hospital Events: Including procedures, antibiotic start and stop dates in addition to other pertinent events   12/7 admit 12/12: INR 3.8, elevated, vitamin K 10 mg subcu x 1 dose given as per GI.  Monitor INR. pharmacy consulted to start ceftriaxone  and Flagyl   for possible intra-abdominal infection 12/13 d/w GI, less likely acute generalized colitis as there are no symptoms and findings could be secondary to liver cirrhosis.  Discontinued Flagyl  12/15 worsening ams and sob; intubated 12/16: MRI Brain negative.  EEG obtained  12/17: No acute events overnight, remains intubated on no sedation and unresponsive, mental status precludes extubation.  EEG resulted with burst suppression pattern indicative of severe cerebral dysfunction and metabolic encephalopathy. Met with pts' son, code status changed to DNR/DNI. 12/18: Overnight pt declined, with increased FiO2 (60%) requirements due to hypoxia with repositioning, along with increasing Levophed  requirements.    Leukocytosis worsening, worsening acidosis, Alk phos/T. Bili worsening. Will broaden ABX to Zosyn , check MRSA PCR for possible Vancomycin , no secretions appreciated from ETT to culture. Overall very poor prognosis.  Palliative Care to follow up with son on GOC discussions.   Patient Ultimately passed away 1535 Pertinent Labs and Studies  Significant Diagnostic Studies DG Chest Port 1 View Result Date: 02/02/2024 CLINICAL DATA:  Acute respiratory failure with hypoxia EXAM: PORTABLE CHEST 1 VIEW COMPARISON:  Two days ago FINDINGS: The heart size and mediastinal contours are within normal limits. Endotracheal tube is in grossly good position. Right-sided PICC line is  noted with distal tip in right atrium. Feeding tube is seen passing through esophagus into stomach. Right upper lobe opacity is noted concerning for atelectasis or possibly infiltrate. Minimal bibasilar subsegmental atelectasis is noted minimal left pleural effusion. The visualized skeletal structures are unremarkable. IMPRESSION: Endotracheal tube in grossly good position. Right upper lobe opacity is noted concerning for atelectasis or possibly infiltrate. Minimal bibasilar subsegmental atelectasis is noted with minimal left pleural effusion. Electronically Signed   By: Lynwood Landy Raddle M.D.   On: 02/02/2024 12:17   DG Abd 1 View Result Date: 02/02/2024 CLINICAL DATA:  Abdominal distension EXAM: ABDOMEN - 1 VIEW COMPARISON:  Four days ago FINDINGS: The bowel gas pattern is normal. Feeding tube tip is seen in expected position of second or third portion duodenum. No radio-opaque calculi or other significant radiographic abnormality are seen. IMPRESSION: Feeding tube tip seen in expected position of second or third portion of duodenum. No abnormal bowel dilatation. Electronically Signed  By: Lynwood Landy Raddle M.D.   On: 02/02/2024 12:15   MR BRAIN W WO CONTRAST Result Date: 01/31/2024 EXAM: MRI BRAIN WITH AND WITHOUT CONTRAST 01/31/2024 02:44:30 PM TECHNIQUE: Multiplanar multisequence MRI of the head/brain was performed with and without the administration of 6 mL gadobutrol  (GADAVIST ) 1 MMOL/ML injection. COMPARISON: CT head 01/30/2024. CLINICAL HISTORY: Neuro deficit, acute, stroke suspected. FINDINGS: BRAIN AND VENTRICLES: No acute infarct. No acute intracranial hemorrhage. No mass effect or midline shift. No hydrocephalus. The sella is unremarkable. Normal flow voids. No mass or abnormal enhancement. Mild chronic microvascular ischemic changes. Mild parenchymal volume loss. ORBITS: No acute abnormality. SINUSES: Trace fluid in the right mastoid air cells. BONES AND SOFT TISSUES: Normal bone marrow signal and  enhancement. There is mild edema adjacent to the right temporomandibular joint. Recommend correlation with tenderness at this level. IMPRESSION: 1. No acute intracranial abnormality. 2. Mild edema adjacent to the right temporomandibular joint. Recommend correlation with focal tenderness at this level. Electronically signed by: Donnice Mania MD 01/31/2024 03:19 PM EST RP Workstation: [id number]   EEG adult Result Date: 01/31/2024 Matthews Elida HERO, MD     02/01/2024  2:32 PM Routine EEG Report ILYNN STAUFFER is a 64 y.o. female with a history of altered mental status and unresponsiveness who is undergoing an EEG to evaluate for seizures. Report: This EEG was acquired with electrodes placed according to the International 10-20 electrode system (including Fp1, Fp2, F3, F4, C3, C4, P3, P4, O1, O2, T3, T4, T5, T6, A1, A2, Fz, Cz, Pz). The following electrodes were missing or displaced: none. There was no clear waking rhythm. The background consisted of burst suppression pattern with on average 2 seconds of delta slowing with 1-2 superimposed triphasic waves alternating with periods of relative suppression lasting up to 5 seconds. This activity was reactive to stimulation. There was no focal slowing. Occasional sleep spindles were noted. There were no interictal epileptiform discharges. There were no electrographic seizures identified. Photic stimulation and hyperventilation were not performed. Impression and clinical correlation: This EEG was obtained while comatose (not on sedation) and is abnormal due to: - Burst suppression pattern in the absence of sedation with delta slowing during bursts -  indicative of severe cerebral dysfunction - Triphasic waves - indicative of metabolic encephalopathy Epileptiform abnormalities were not seen during this recording. Elida Matthews, MD [facility] [phone] If 7pm- 7am, please page neurology on call as listed in AMION.   DG Chest Port 1 View Result Date:  01/31/2024 EXAM: 1 VIEW(S) XRAY OF THE CHEST 01/31/2024 07:00:00 AM COMPARISON: 01/30/2024 CLINICAL HISTORY: Encounter for intubation. FINDINGS: LINES, TUBES AND DEVICES: Endotracheal tube in right mainstem bronchus. Right arm PICC stable in position. Feeding tube with tip in distal duodenum. LUNGS AND PLEURA: Low lung volumes. Left basilar airspace disease. Bilateral interstitial prominence. Small left pleural effusion. No pneumothorax. HEART AND MEDIASTINUM: No acute abnormality of the cardiac and mediastinal silhouettes. BONES AND SOFT TISSUES: Multiple chronic bilateral rib fractures. IMPRESSION: 1. Endotracheal tube malpositioned into the right mainstem bronchus; recommend retraction to appropriate position. 2. Left basilar airspace disease with bilateral interstitial prominence, which may reflect atelectasis and/or edema. 3. Small left pleural effusion. 4. Results were called to the ordering provider at the time of interpretation. I have personally spoken with Dr. Kathrene, who acknowledged these findings. Electronically signed by: Waddell Calk MD 01/31/2024 07:18 AM EST RP Workstation: [id number]   DG Chest Port 1 View Result Date: 01/30/2024 EXAM: 1 VIEW(S) XRAY OF THE CHEST 01/30/2024 09:01:18  PM COMPARISON: 01/22/2024 CLINICAL HISTORY: Tachypnea FINDINGS: LINES, TUBES AND DEVICES: Right PICC in place with tip at superior cavoatrial junction. Enteric tube in place with tip below field of view. LUNGS AND PLEURA: Small bilateral pleural effusions. Mild pulmonary edema. Left basilar airspace opacity. No pneumothorax. HEART AND MEDIASTINUM: Aortic arch atherosclerosis. No acute abnormality of the cardiac and mediastinal silhouettes. BONES AND SOFT TISSUES: Chronic bilateral rib fractures. Right humeral head deformity. IMPRESSION: 1. Small bilateral pleural effusions and mild pulmonary edema. 2. Left basilar airspace opacity, which may reflect atelectasis or superimposed infection. Electronically signed by:  Oneil Devonshire MD 01/30/2024 09:15 PM EST RP Workstation: [id number]   CT HEAD WO CONTRAST ( ) Result Date: 01/30/2024 EXAM: CT HEAD WITHOUT 01/30/2024 09:00:02 PM TECHNIQUE: CT of the head was performed without the administration of intravenous contrast. Automated exposure control, iterative reconstruction, and/or weight based adjustment of the mA/kV was utilized to reduce the radiation dose to as low as reasonably achievable. COMPARISON: 01/22/2024 CLINICAL HISTORY: Mental status change, unknown cause. FINDINGS: BRAIN AND VENTRICLES: No acute intracranial hemorrhage. No mass effect or midline shift. No extra-axial fluid collection. No evidence of acute infarct. Atrophy and chronic small vessel disease changes. ORBITS: No acute abnormality. SINUSES AND MASTOIDS: No acute abnormality. SOFT TISSUES AND SKULL: No acute skull fracture. No acute soft tissue abnormality. IMPRESSION: 1. No acute intracranial abnormality. 2. Atrophy and chronic small vessel ischemic changes. Electronically signed by: Oneil Devonshire MD 01/30/2024 09:14 PM EST RP Workstation: [id number]   DG [place] MATSU Tube Plc W/Fl W/Rad Result Date: 01/30/2024 INDICATION: Malnutrition EXAM: NASO G TUBE PLACEMENT WITH FL AND WITH RAD FLUOROSCOPY TIME:  Radiation Exposure Index (as provided by the fluoroscopic device): 6.4 mGy Kerma COMPLICATIONS: None immediate PROCEDURE: The Dobbhoff tube was lubricated with viscous lidocaine  inserted into the right nostril. Under intermittent fluoroscopic guidance, the Dobbhoff tube was advanced through the stomach, through the duodenum with tip ultimately terminating over the expected location of the duodenal - jejunal junction. A spot fluoroscopic image was saved for documentation purposes. The tube was affixed to the patient's nose with tape at approximately 78 cm. The patient tolerated the procedure well without immediate postprocedural complication. IMPRESSION: Successful fluoroscopic guided placement of Dobbhoff  tube with tip terminating over the duodenojejunal junction. The tube is ready for immediate use. This exam was performed by Procedure performed by: Sherrilee Bal PA-C, and was supervised and interpreted by Dr. Ryan Chess. Electronically Signed   By: Ryan Chess M.D.   On: 01/30/2024 17:09   DG Abd 1 View Result Date: 01/29/2024 EXAM: 1 VIEW XRAY OF THE ABDOMEN 01/29/2024 09:45:00 AM COMPARISON: None available. CLINICAL HISTORY: Constipation FINDINGS: LINES, TUBES AND DEVICES: Foley catheter in place. BOWEL: Nonobstructive bowel gas pattern. Stool burden is average. SOFT TISSUES: No abnormal calcifications. BONES: Left femoral neck screws in place. Mild lumbar dextroscoliosis with associated degenerative changes. IMPRESSION: 1. No acute abdominal abnormality. 2. Average stool burden. Electronically signed by: Greig Pique MD 01/29/2024 03:23 PM EST RP Workstation: [id number]   ECHOCARDIOGRAM COMPLETE Result Date: 01/29/2024    ECHOCARDIOGRAM REPORT   Patient Name:   MAYERLI KIRST Date of Exam: 01/29/2024 Medical Rec #:  [id number]         Height:       60.0 in Accession #:    [id number]        Weight:       132.9 lb Date of Birth:  [birth year]         BSA:  1.569 m Patient Age:    63 years          BP:           118/72 mmHg Patient Gender: F                 HR:           100 bpm. Exam Location:  [facility] Procedure: 2D Echo, Cardiac Doppler, Color Doppler and Intracardiac            Opacification Agent (Both Spectral and Color Flow Doppler were            utilized during procedure). Indications:     CHF-Acute Systolic I50.21  History:         Patient has prior history of Echocardiogram examinations.                  Signs/Symptoms:Hypotension. Headache.  Sonographer:     Bari Roar Referring Phys:  [id number] ELVAN SOR Diagnosing Phys: Marsa Dooms MD  Sonographer Comments: Technically difficult study due to poor echo windows. IMPRESSIONS  1. Left ventricular ejection fraction, by  estimation, is 60 to 65%. The left ventricle has normal function. The left ventricle has no regional wall motion abnormalities. Left ventricular diastolic parameters are consistent with Grade I diastolic dysfunction (impaired relaxation).  2. Right ventricular systolic function is normal. The right ventricular size is normal.  3. The mitral valve is normal in structure. Trivial mitral valve regurgitation. No evidence of mitral stenosis.  4. The aortic valve is normal in structure. Aortic valve regurgitation is not visualized. No aortic stenosis is present.  5. The inferior vena cava is normal in size with greater than 50% respiratory variability, suggesting right atrial pressure of 3 mmHg. FINDINGS  Left Ventricle: Left ventricular ejection fraction, by estimation, is 60 to 65%. The left ventricle has normal function. The left ventricle has no regional wall motion abnormalities. Definity  contrast agent was given IV to delineate the left ventricular  endocardial borders. Strain was performed and the global longitudinal strain is indeterminate. The left ventricular internal cavity size was normal in size. There is no left ventricular hypertrophy. Left ventricular diastolic parameters are consistent with Grade I diastolic dysfunction (impaired relaxation). Right Ventricle: The right ventricular size is normal. No increase in right ventricular wall thickness. Right ventricular systolic function is normal. Left Atrium: Left atrial size was normal in size. Right Atrium: Right atrial size was normal in size. Pericardium: There is no evidence of pericardial effusion. Mitral Valve: The mitral valve is normal in structure. Trivial mitral valve regurgitation. No evidence of mitral valve stenosis. MV peak gradient, 3.0 mmHg. The mean mitral valve gradient is 1.0 mmHg. Tricuspid Valve: The tricuspid valve is normal in structure. Tricuspid valve regurgitation is trivial. No evidence of tricuspid stenosis. Aortic Valve: The aortic  valve is normal in structure. Aortic valve regurgitation is not visualized. No aortic stenosis is present. Aortic valve mean gradient measures 2.0 mmHg. Aortic valve peak gradient measures 3.8 mmHg. Aortic valve area, by VTI measures 1.63 cm. Pulmonic Valve: The pulmonic valve was normal in structure. Pulmonic valve regurgitation is not visualized. No evidence of pulmonic stenosis. Aorta: The aortic root is normal in size and structure. Venous: The inferior vena cava is normal in size with greater than 50% respiratory variability, suggesting right atrial pressure of 3 mmHg. IAS/Shunts: No atrial level shunt detected by color flow Doppler. Additional Comments: 3D was performed not requiring image post processing on an independent workstation  and was indeterminate.  LEFT VENTRICLE PLAX 2D LVIDd:         3.60 cm   Diastology LVIDs:         2.40 cm   LV e' medial:    7.94 cm/s LV PW:         0.80 cm   LV E/e' medial:  8.4 LV IVS:        1.00 cm   LV e' lateral:   6.74 cm/s LVOT diam:     1.60 cm   LV E/e' lateral: 9.9 LV SV:         28 LV SV Index:   18 LVOT Area:     2.01 cm  RIGHT VENTRICLE RV Basal diam:  2.20 cm RV Mid diam:    2.20 cm RV S prime:     12.50 cm/s TAPSE (M-mode): 1.8 cm LEFT ATRIUM             Index        RIGHT ATRIUM          Index LA diam:        3.10 cm 1.98 cm/m   RA Area:     5.19 cm LA Vol (A2C):   23.7 ml 15.10 ml/m  RA Volume:   7.48 ml  4.77 ml/m LA Vol (A4C):   21.6 ml 13.77 ml/m LA Biplane Vol: 23.4 ml 14.91 ml/m  AORTIC VALVE                    PULMONIC VALVE AV Area (Vmax):    1.75 cm     PV Vmax:        0.96 m/s AV Area (Vmean):   1.47 cm     PV Peak grad:   3.7 mmHg AV Area (VTI):     1.63 cm     RVOT Peak grad: 3 mmHg AV Vmax:           98.10 cm/s AV Vmean:          72.000 cm/s AV VTI:            0.173 m AV Peak Grad:      3.8 mmHg AV Mean Grad:      2.0 mmHg LVOT Vmax:         85.40 cm/s LVOT Vmean:        52.700 cm/s LVOT VTI:          0.140 m LVOT/AV VTI ratio: 0.81   AORTA Ao Root diam: 2.30 cm Ao Asc diam:  2.70 cm MITRAL VALVE MV Area (PHT): 7.29 cm     SHUNTS MV Area VTI:   1.75 cm     Systemic VTI:  0.14 m MV Peak grad:  3.0 mmHg     Systemic Diam: 1.60 cm MV Mean grad:  1.0 mmHg MV Vmax:       0.87 m/s MV Vmean:      48.9 cm/s MV Decel Time: 104 msec MV E velocity: 66.60 cm/s MV A velocity: 107.00 cm/s MV E/A ratio:  0.62 MV A Prime:    10.3 cm/s Marsa Dooms MD Electronically signed by Marsa Dooms MD Signature Date/Time: 01/29/2024/1:02:07 PM    Final    CT ANGIO GI BLEED Result Date: 01/28/2024 EXAM: CTA ABDOMEN AND PELVIS WITH CONTRAST 01/28/2024 10:48:09 AM TECHNIQUE: CTA images of the abdomen and pelvis with intravenous contrast. Three-dimensional MIP/volume rendered formations were performed. Automated exposure control, iterative reconstruction, and/or weight based adjustment  of the mA/kV was utilized to reduce the radiation dose to as low as reasonably achievable. The early arterial phase images do not include the pelvis. COMPARISON: CT abdomen and pelvis 01/22/2024. CLINICAL HISTORY: 64 year old female with lower gastrointestinal bleed, rule out gastrointestinal bleeding. History of hepatitis, ascites, and status post ultrasound guided paracentesis yesterday. FINDINGS: VASCULATURE: GI BLEED: No active extravasation of contrast within the GI tract. Extensive aortoiliac calcified atherosclerosis. Major arterial structures and the portal venous system in the abdomen and pelvis are patent. ABDOMEN/PELVIS: LOWER CHEST: Moderate layering pleural effusions have progressed. Increased streaky and dependent lung base opacity most resembling atelectasis. Chronic right lower rib fractures. Calcified coronary artery and aortic atherosclerosis. LIVER: Hepatic steatosis redemonstrated. Stable liver enhancement. GALLBLADDER AND BILE DUCTS: Ongoing gallbladder distention and wall thickening. No progressive pericholecystic inflammation. No biliary ductal  dilatation. SPLEEN: No splenomegaly. The spleen is unremarkable. PANCREAS: The pancreas is unremarkable. ADRENAL GLANDS: Bilateral adrenal glands demonstrate no acute abnormality. KIDNEYS, URETERS AND BLADDER: No stones in the kidneys or ureters. No hydronephrosis. No perinephric or periureteral stranding. Small volume of gas in the urinary bladde GI AND BOWEL: Generalized colonic wall thickening and edema from the cecum through to the splenic flexure. The descending colon is less affected. The rectosigmoid colon is decompressed but appears largely normal. Stomach and small bowel loops are also decompressed diffusely. Appendix appears to remain normal in the right lower quadrant on series 16 image 73. No contrast extravasation identified into the gastrointestinal lumen. There is no bowel obstruction. No abnormal bowel wall distension. Generalized acute colitis. Differential considerations include infectious colitis, portal hypertensive colopathy enteropathy. REPRODUCTIVE: Reproductive organs are unremarkable. PERITONEUM AND RETROPERITONEUM: Abdominal and pelvic ascites with simple fluid density, volume not significantly changed from the previous CT. No free air. LYMPH NODES: No lymphadenopathy. BONES AND SOFT TISSUES: Chronic right lower rib fractures. Chronic lumbar spine degeneration. Chronic proximal left femur ORIF. Generalized body wall edema / anasarca is substantially progressed from the CT comparison. IMPRESSION: 1. Abdominal and pelvic ascites with simple fluid density, volume not significantly changed from prior CT. Progressed diffuse body wall edema, Moderate layering pleural effusions,. 2. Generalized Acute Colitis (cecum through splenic flexure). Main differential considerations include infectious colitis and portal hypertensive enteropathy. 3. Small volume of gas in the urinary bladder, raising the possibility of gas-forming infection if not explained by recent catheterization. 4. No active  gastrointestinal bleeding on CTA. 5. Hepatic steatosis with stable hepatic enhancement. Electronically signed by: Helayne Hurst MD 01/28/2024 11:14 AM EST RP Workstation: [id number]   US  Paracentesis Result Date: 01/27/2024 INDICATION: [id number] Abdominal ascites [id number] 64 year old female admitted with severe steatohepatittis, AKI, with 1 week history of abdominal pain, nausea and vomiting. Imaging noting mild ascites. IR was requested for diagnostic and therapeutic paracentesis, to rule out possible SBP. EXAM: ULTRASOUND GUIDED DIAGNOSTIC AND THERAPEUTIC PARACENTESIS MEDICATIONS: 5 mL of 1% lidocaine . COMPLICATIONS: None immediate. PROCEDURE: Informed written consent was obtained from the patient after a discussion of the risks, benefits and alternatives to treatment. A timeout was performed prior to the initiation of the procedure. Initial ultrasound scanning demonstrates a large amount of ascites within the right lower abdominal quadrant. The right lower abdomen was prepped and draped in the usual sterile fashion. 1% lidocaine  was used for local anesthesia. Following this, a 19 gauge, 7-cm, Yueh catheter was introduced. An ultrasound image was saved for documentation purposes. The paracentesis was performed. The catheter was removed and a dressing was applied. The patient tolerated the procedure well without immediate post procedural  complication. FINDINGS: A total of approximately 1.6 L of clear, straw-colored peritoneal fluid was removed. Samples were sent to the laboratory as requested by the clinical team. IMPRESSION: Successful ultrasound-guided paracentesis yielding 1.6 L of peritoneal fluid. Procedure performed by: Carlin Griffon, PA-C PLAN: If the patient eventually requires >/=2 paracenteses in a 30 day period, candidacy for formal evaluation by the [facility] Inter[facility] Rad[facility] will be assessed. Thom Hall, MD [facility]  Radiology Electronically Signed   By: Thom Hall M.D.   On: 01/27/2024 16:57   US  EKG SITE RITE Result Date: 01/27/2024 If Site Rite image not attached, placement could not be confirmed due to current cardiac rhythm.  US  ABDOMEN LIMITED RUQ (LIVER/GB) Result Date: 01/22/2024 EXAM: Right Upper Quadrant Abdominal Ultrasound TECHNIQUE: Real-time ultrasonography of the right upper quadrant of the abdomen was performed. COMPARISON: US  Abdomen Limited 07/09/2019. CLINICAL HISTORY: Abdomen pain, elevated liver function tests. FINDINGS: LIVER: Nodular hepatic contour. Increased hepatic echogenicity. No intrahepatic biliary ductal dilatation. No mass. BILIARY SYSTEM: Gallbladder wall thickening measures 4.2 mm. No evidence of pericholecystic fluid. No cholelithiasis. The common bile duct measures 2.3 mm. RIGHT KIDNEY: The right kidney is grossly unremarkable in appearances without evidence of hydronephrosis, echogenic calculi or worrisome mass lesions. PANCREAS: Visualized portions of the pancreas are unremarkable. OTHER: Small volume simple free fluid ascites. IMPRESSION: 1. Cirrhosis and hepatic steatosis. No focal liver lesions identified. Please note that liver protocol enhanced MR and CT are the most sensitive tests for the screening detection of hepatocellular carcinoma in the high risk setting of cirrhosis. 2. Nonspecific gallbladder wall thickening. This can be seen in the setting of chronic liver disease. 3. Small-volume simple ascites. Electronically signed by: Kate Plummer MD 01/22/2024 07:21 PM EST RP Workstation: [id number]   CT ABDOMEN PELVIS W CONTRAST Result Date: 01/22/2024 CLINICAL DATA:  Abdominal pain, diarrhea EXAM: CT ABDOMEN AND PELVIS WITH CONTRAST TECHNIQUE: Multidetector CT imaging of the abdomen and pelvis was performed using the standard protocol following bolus administration of intravenous contrast. RADIATION DOSE REDUCTION: This exam was performed according to the departmental  dose-optimization program which includes automated exposure control, adjustment of the mA and/or kV according to patient size and/or use of iterative reconstruction technique. CONTRAST:  OMNIPAQUE  IOHEXOL  300 MG/ML  SOLN COMPARISON:  07/12/2023 FINDINGS: Lower chest: Trace bilateral pleural effusions and minimal dependent lower lobe atelectasis. Small hiatal hernia. Hepatobiliary: Marked hepatic steatosis. No focal liver abnormality. Gallbladder is moderately distended, with no evidence of cholelithiasis or cholecystitis. Pancreas: Unremarkable. No pancreatic ductal dilatation or surrounding inflammatory changes. Spleen: Normal in size without focal abnormality. Adrenals/Urinary Tract: Kidneys enhance normally and symmetrically. No urinary tract calculi or obstruction. The adrenals and bladder are unremarkable. Stomach/Bowel: No bowel obstruction or ileus. There is diffuse colonic wall thickening compatible with inflammatory or infectious colitis. Additionally, there is thickening of the gastric rugal folds which could reflect gastritis. Vascular/Lymphatic: Aortic atherosclerosis. No enlarged abdominal or pelvic lymph nodes. Reproductive: Status post hysterectomy. No adnexal masses. Other: Small volume ascites, most pronounced in the lower pelvis. No free intraperitoneal gas. No abdominal wall hernia. Musculoskeletal: Stable postsurgical changes of the left hip. No acute or destructive bony abnormalities. Reconstructed images demonstrate no additional findings. IMPRESSION: 1. Diffuse colonic wall thickening compatible with inflammatory or infectious colitis. 2. Thickening of the gastric rugal folds which may reflect gastritis. 3. Severe hepatic steatosis. 4. Small volume ascites. 5. Trace bilateral pleural effusions. 6. Small hiatal hernia. 7.  Aortic Atherosclerosis (ICD10-I70.0). Electronically  Signed   By: Ozell Daring M.D.   On: 01/22/2024 16:53   CT C-SPINE NO CHARGE Result Date: 01/22/2024 EXAM: CT  CERVICAL SPINE WITHOUT CONTRAST 01/22/2024 04:35:27 PM TECHNIQUE: CT of the cervical spine was performed without the administration of intravenous contrast. Multiplanar reformatted images are provided for review. Automated exposure control, iterative reconstruction, and/or weight based adjustment of the mA/kV was utilized to reduce the radiation dose to as low as reasonably achievable. COMPARISON: None available. CLINICAL HISTORY: FINDINGS: CERVICAL SPINE: BONES AND ALIGNMENT: Straightening of the normal cervical lordosis is present. Slight degenerative retrolisthesis is present at C5-C6. No acute fracture or traumatic malalignment. DEGENERATIVE CHANGES: Slight degenerative retrolisthesis is present at C5-C6. SOFT TISSUES: Atherosclerotic changes are present at the carotid bifurcations without significant stenosis. No prevertebral soft tissue swelling. LUNGS: Centrilobular emphysematous changes are noted. IMPRESSION: 1. No acute abnormality of the cervical spine. 2. Straightening of the normal cervical lordosis and slight degenerative retrolisthesis at C5-C6. 3. Centrilobular emphysematous changes are noted; consider evaluation for eligibility for low-dose CT lung cancer screening program given emphysema as an independent risk factor for lung cancer in appropriate age groups. Electronically signed by: Lonni Necessary MD 01/22/2024 04:51 PM EST RP Workstation: [id number]   CT Soft Tissue Neck W Contrast Result Date: 01/22/2024 EXAM: CT NECK WITH CONTRAST 01/22/2024 04:35:27 PM TECHNIQUE: CT of the neck was performed with the administration of 100 mL of iohexol  (OMNIPAQUE ) 300 MG/ML solution. Multiplanar reformatted images are provided for review. Automated exposure control, iterative reconstruction, and/or weight based adjustment of the mA/kV was utilized to reduce the radiation dose to as low as reasonably achievable. COMPARISON: None available. CLINICAL HISTORY: Recurrent postprandial emphysema. Right sided  dysphagia. Question foreign body. FINDINGS: AERODIGESTIVE TRACT: Mucosal enhancement in the oropharynx may reflect pharyngitis. No discrete mass lesion or foreign body is present. No edema. SALIVARY GLANDS: The parotid and submandibular glands are unremarkable. THYROID : Unremarkable. LYMPH NODES: No suspicious cervical lymphadenopathy. SOFT TISSUES: No mass or fluid collection. BRAIN, ORBITS, SINUSES AND MASTOIDS: No acute abnormality. LUNGS AND MEDIASTINUM: Centrilobular emphysematous changes are present in both lungs. Mild dependent atelectasis is present. Atherosclerotic calcifications are present at the aortic arch and great vessel origins. BONES: No focal bone abnormality. VASCULATURE: Atherosclerotic calcifications are present at the carotid bifurcations bilaterally. No focal stenosis is present. IMPRESSION: 1. Mucosal enhancement in the oropharynx, possibly reflecting pharyngitis. No discrete mass lesion or foreign body identified. 2. Centrilobular emphysema. Consider evaluation for a low-dose CT lung cancer screening program given emphysema as an independent risk factor. 3. Atherosclerotic changes as detailed above, without significant stenosis. Electronically signed by: Lonni Necessary MD 01/22/2024 04:47 PM EST RP Workstation: [id number]   CT HEAD WO CONTRAST ( ) Result Date: 01/22/2024 EXAM: CT HEAD WITHOUT CONTRAST 01/22/2024 04:35:27 PM TECHNIQUE: CT of the head was performed without the administration of intravenous contrast. Automated exposure control, iterative reconstruction, and/or weight based adjustment of the mA/kV was utilized to reduce the radiation dose to as low as reasonably achievable. COMPARISON: CT head without contrast 07/08/2019. CLINICAL HISTORY: Headache, new onset (Age >= 51y). Fall 1 week ago. FINDINGS: BRAIN AND VENTRICLES: No acute hemorrhage. No evidence of acute infarct. Periventricular white matter decreased attenuation consistent with small vessel ischemic changes.  Prominent ventricles, sulci and cisterns consistent with age-related involutional changes. Atrophy and white matter changes are moderately advanced for age with some progression since the prior exam. No hydrocephalus. No extra-axial collection. No mass effect or midline shift. ORBITS: No acute abnormality. SINUSES: No acute abnormality.  SOFT TISSUES AND SKULL: No acute soft tissue abnormality. No skull fracture. IMPRESSION: 1. No acute intracranial abnormality. 2. Moderately advanced age-related involutional changes and small vessel ischemic changes with some progression since the prior exam. Electronically signed by: Lonni Necessary MD 01/22/2024 04:40 PM EST RP Workstation: [id number]   DG Chest 2 View Result Date: 01/22/2024 CLINICAL DATA:  Poor oral intake. EXAM: DG CHEST 2V COMPARISON:  05/09/2022. FINDINGS: The heart size and mediastinal contours are within normal limits. No consolidation is seen. There are likely small bilateral pleural effusions with a associated atelectasis or infiltrate on the lateral view. Degenerative changes are present in the thoracic spine. Old rib fractures are present in the ribs on the right. No acute osseous abnormality is seen. IMPRESSION: Small bilateral pleural effusions with atelectasis or infiltrate. Electronically Signed   By: Leita Birmingham M.D.   On: 01/22/2024 16:01    Microbiology Recent Results (from the past 240 hours)  Body fluid culture w Gram Stain     Status: None   Collection Time: 01/27/24  3:30 PM   Specimen: PATH Cytology Peritoneal fluid  Result Value Ref Range Status   Specimen Description   Final    PERITONEAL Performed at [facility], [address], [place], [place] [address]    Special Requests   Final    NONE Performed at [facility], [address] [place], [place] [address]    Gram Stain   Final    WBC PRESENT, PREDOMINANTLY PMN NO ORGANISMS SEEN CYTOSPIN SMEAR    Culture   Final    NO GROWTH 4  DAYS Performed at [facility], [address] [place], [place] [address]    Report Status 01/31/2024 FINAL  Final  Culture, blood (routine x 2) Call MD if unable to obtain prior to antibiotics being given     Status: None (Preliminary result)   Collection Time: 01/30/24 10:49 PM   Specimen: BLOOD  Result Value Ref Range Status   Specimen Description BLOOD LEFT ANTECUBITAL  Final   Special Requests   Final    BOTTLES DRAWN AEROBIC AND ANAEROBIC Blood Culture adequate volume   Culture   Final    NO GROWTH 3 DAYS Performed at [facility], [address] [place], [place] [address]    Report Status PENDING  Incomplete  Culture, blood (routine x 2) Call MD if unable to obtain prior to antibiotics being given     Status: None (Preliminary result)   Collection Time: 01/30/24 10:58 PM   Specimen: BLOOD  Result Value Ref Range Status   Specimen Description BLOOD BLOOD LEFT HAND  Final   Special Requests   Final    BOTTLES DRAWN AEROBIC ONLY Blood Culture results may not be optimal due to an inadequate volume of blood received in culture bottles   Culture   Final    NO GROWTH 3 DAYS Performed at [facility], [address] [place], [place] [address]    Report Status PENDING  Incomplete  MRSA Next Gen by PCR, Nasal     Status: None   Collection Time: 01/31/24  3:30 AM   Specimen: Nasal Mucosa; Nasal Swab  Result Value Ref Range Status   MRSA by PCR Next Gen NOT DETECTED NOT DETECTED Final    Comment: (NOTE) The GeneXpert MRSA Assay (FDA approved for NASAL specimens only), is one component of a comprehensive MRSA colonization surveillance program. It is not intended to diagnose MRSA infection nor  to guide or monitor treatment for MRSA infections. Test performance is not FDA approved in patients less than 48 years old. Performed at [facility], [address] [place], [place] [address]   MRSA Next Gen by PCR, Nasal     Status: None   Collection  Time: 02/02/24  1:49 PM   Specimen: Nasal Mucosa; Nasal Swab  Result Value Ref Range Status   MRSA by PCR Next Gen NOT DETECTED NOT DETECTED Final    Comment: (NOTE) The GeneXpert MRSA Assay (FDA approved for NASAL specimens only), is one component of a comprehensive MRSA colonization surveillance program. It is not intended to diagnose MRSA infection nor to guide or monitor treatment for MRSA infections. Test performance is not FDA approved in patients less than 55 years old. Performed at [facility], [address] [place], [place] [address]     Lab Basic Metabolic Panel: Recent Labs  Lab 01/30/24 2031 01/30/24 2249 01/31/24 0429 02/01/24 0407 02/01/24 2047 02/02/24 0357 2024-02-08 0515  NA 143  --  146* 145  --  137 148*  K 3.1*  --  3.8 2.8* 3.1* 4.7 4.7  CL 115*  --  115* 113*  --  106 116*  CO2 17*  --  16* 19*  --  15* 19*  GLUCOSE 123*  --  168* 149*  --  391* 120*  BUN 7*  --  7* 7*  --  7* 9  CREATININE 0.91  --  1.04* 1.11*  --  1.06* 1.42*  CALCIUM 8.6*  --  8.5* 8.6*  --  7.8* 8.3*  MG  --  1.8 2.4 2.2  --  1.9 1.9  PHOS  --   --  2.3* 1.8* 2.9 2.0* 2.9   Liver Function Tests: Recent Labs  Lab 01/29/24 0528 01/30/24 2031 01/31/24 0429 02/01/24 0407 02/02/24 0357  AST 46* 37 38 35 43*  ALT 46* 37 34 26 26  ALKPHOS 161* 160* 145* 141* 172*  BILITOT 10.2* 12.7* 11.8* 11.6* 13.5*  PROT 3.7* 4.1* 3.9* 4.3* 4.3*  ALBUMIN  2.5* 2.7* 2.6* 2.9* 2.6*   No results for input(s): LIPASE, AMYLASE in the last 168 hours. Recent Labs  Lab 01/28/24 1142 01/30/24 2308 01/31/24 0848 02/01/24 0407  AMMONIA 100* 104* 97* 176*   CBC: Recent Labs  Lab 01/28/24 1845 01/29/24 0528 01/30/24 2031 01/31/24 0429 02/01/24 0407 02/02/24 0357 08-Feb-2024 0515  WBC 8.7   < > 10.1 12.1* 13.6* 17.9* 20.5*  NEUTROABS 6.4  --   --   --   --   --   --   HGB 10.6*   < > 10.3* 9.8* 8.8* 9.2* 7.9*  HCT 30.1*   < > 28.2* 27.3* 25.2* 26.5* 24.3*  MCV 100.0   < >  98.3 100.4* 101.6* 104.3* 108.5*  PLT 17*   < > 32* 26* 21* 17* 21*   < > = values in this interval not displayed.   Cardiac Enzymes: No results for input(s): CKTOTAL, CKMB, CKMBINDEX, TROPONINI in the last 168 hours. Sepsis Labs: Recent Labs  Lab 01/31/24 0429 01/31/24 0610 02/01/24 0407 02/02/24 0357 2024/02/08 0515  PROCALCITON  --  1.33  --   --   --   WBC 12.1*  --  13.6* 17.9* 20.5*  LATICACIDVEN 5.0* 4.8* 4.1* 4.0*  --       Gamal Todisco 02-08-24, 3:38 PM   "

## 2024-02-16 NOTE — Progress Notes (Signed)
 "                                                                                                                                                                                                          Daily Progress Note   Patient Name: Claire Brown       Date: 02/10/2024 DOB: 08-27-1960  Age: 64 y.o. MRN#: [id number] Attending Physician: No att. providers found Primary Care Physician: Pcp, No Admit Date: 01/22/2024  Reason for Consultation/Follow-up: Establishing goals of care  HPI/Brief Hospital Review: 64 y.o. female  with past medical history of bipolar disorder, cirrhosis and ETOH and tobacco use admitted from home on 01/22/2024 with complaints of N/V/D and associated poor PO intake x 1 week.   In ED found to have significantly elevated liver enzymes--now normalized, elevated bilirubin and INR Found to have severe steatohepatitis, GI consulted and following close Anemic-requiring blood transfusions Encephalopathic--CIWA protocol   Early in the morning on 12/16, patient experienced acute respiratory failure possibly related to aspiration pneumonia.  Stat CT at that time was ordered and was negative for acute intracranial abnormalities.  Patient continued to be obtunded, tachycardic, and tachypneic.  She was transferred to stepdown for closer monitoring.  However, she became more hypotensive-developing septic shock-was subsequently intubated/placed on vent.   Palliative medicine was consulted for assisting with goals of care conversations.  Subjective: Extensive chart review has been completed prior to meeting patient including labs, vital signs, imaging, progress notes, orders, and available advanced directive documents from current and previous encounters.    Visited with Ms. Droessler at her bedside. She remains intubated and sedated, requiring maximum support from ventilator. Remains tachycardic and on vasopressor support. No family or visitors at bedside during time of  visit.  Spoke with nursing who shared son-Colt was able to visit overnight and confirmed his wish to transition to comfort care.  Called and spoke with son [name]. [name] shares he has a good understanding of medical condition for Ms. Amer. He shares CCM team spoke briefly about comfort care. Provided education on comfort care, Ms. Naab would no longer receive aggressive medical interventions such as continuous vital signs, lab work, radiology testing, or medications not focused on comfort. All care would focus on how the patient is looking and feeling. This would include management of any symptoms that may cause discomfort, pain, shortness of breath, cough, nausea, agitation, anxiety, and/or secretions etc. Symptoms would be managed with medications and other non-pharmacological interventions such as spiritual support if requested, repositioning, music therapy, or therapeutic listening. Family verbalized understanding  and appreciation. Comfort medications would be initiated prior to discontinuing mechanical ventilation or vasopressor support. Colt verbalized understanding and wishes to proceed with comfort care, Colt not planning to return to bedside. [name] provides permission for [employer] other to be called and allowed a chance to visit.  Called and spoke to [employer], provided medical updates and decision to transition to comfort care.  [name] would like an opportunity to visit with Ms. Klinke this afternoon.  Nursing staff made me aware when [facility] visiting bedside.  Visited with [name] at bedside explained transition to comfort care.  [name] inquires about Ms. Saad's belongings including her purse and cell phone at bedside.  Call placed back to [place] inquiring about belongings, [name] shares for crisis to take belongings back to his home.  With Chris's permission provided with [name] Ross's cell phone number as indicated in Ms. Golebiewski's contact information list.  Updates provided to [facility]  that comfort medications have been initiated, once comfort established next step is to extubate again with focus being on comfort with an understanding Ms. Nijjar's time is likely limited.  Colt verbalized understanding and again agrees to proceeding with transition to comfort care.  Discussed orders and placed orders for comfort measures.  Frequent check-in's with the nursing staff and at bedside with Ms. Mullen throughout process.  Ms. Vanrossum remained peaceful.  Update received from nurse regarding time of death.  Objective:  Physical Exam Constitutional:      General: She is not in acute distress.    Appearance: She is ill-appearing.  HENT:     Head: Normocephalic.     Mouth/Throat:     Mouth: Mucous membranes are dry.  Cardiovascular:     Rate and Rhythm: Tachycardia present.  Pulmonary:     Effort: Pulmonary effort is normal. No respiratory distress.  Abdominal:     General: There is no distension.     Palpations: Abdomen is soft.  Musculoskeletal:        General: Swelling present.  Skin:    Coloration: Skin is jaundiced.  Neurological:     Comments: Unresponsive             Vital Signs: BP (!) 49/19   Pulse (!) 119   Temp 98.6 F (37 C)   Resp 17   Ht 5' (1.524 m)   Wt 62 kg   SpO2 (!) 34%   BMI 26.69 kg/m  SpO2: SpO2: (!) 34 % O2 Device: O2 Device: Ventilator O2 Flow Rate: O2 Flow Rate (L/min): 3 L/min   Palliative Care Assessment & Plan   Assessment/Recommendation/Plan  Transition to CMO Anticipate hospital death  Care plan was discussed with CCM team and nursing staff  Thank you for allowing the Palliative Medicine Team to assist in the care of this patient.  Visit includes: Detailed review of medical records (labs, imaging, vital signs), medically appropriate exam (mental status, respiratory, cardiac, skin), discussed with treatment team, counseling and educating patient, family and staff, documenting clinical information, medication management and  coordination of care.  Waddell Lesches, DNP, AGNP-C Palliative Medicine   Please contact Palliative Medicine Team phone at [phone] for questions and concerns.   "

## 2024-02-16 DEATH — deceased

## 2024-02-17 ENCOUNTER — Telehealth: Payer: Self-pay

## 2024-02-17 NOTE — Telephone Encounter (Signed)
 Copied from CRM #[id number]. Topic: General - Deceased Patient >> 03-05-24 12:43 PM Joesph PARAS wrote: Name of caller: Lowella  Date of death: 02/20/24   Name of funeral home: [employer]  Phone number of funeral home: [phone]  Provider that needs to sign form: Dr. Isaiah  Timeline for signing: ASAP
